# Patient Record
Sex: Female | Born: 1955
Health system: Southern US, Community
[De-identification: ages and names within clinical notes are randomized; demographics above are authoritative.]

## PROBLEM LIST (undated history)

## (undated) DIAGNOSIS — E785 Hyperlipidemia, unspecified: Secondary | ICD-10-CM

## (undated) DIAGNOSIS — E669 Obesity, unspecified: Secondary | ICD-10-CM

## (undated) DIAGNOSIS — K5792 Diverticulitis of intestine, part unspecified, without perforation or abscess without bleeding: Secondary | ICD-10-CM

## (undated) DIAGNOSIS — H8109 Meniere's disease, unspecified ear: Secondary | ICD-10-CM

## (undated) DIAGNOSIS — I10 Essential (primary) hypertension: Secondary | ICD-10-CM

## (undated) DIAGNOSIS — R42 Dizziness and giddiness: Secondary | ICD-10-CM

## (undated) DIAGNOSIS — R011 Cardiac murmur, unspecified: Secondary | ICD-10-CM

## (undated) DIAGNOSIS — H269 Unspecified cataract: Secondary | ICD-10-CM

## (undated) DIAGNOSIS — K56609 Unspecified intestinal obstruction, unspecified as to partial versus complete obstruction: Secondary | ICD-10-CM

## (undated) DIAGNOSIS — Z9889 Other specified postprocedural states: Secondary | ICD-10-CM

## (undated) DIAGNOSIS — N189 Chronic kidney disease, unspecified: Secondary | ICD-10-CM

## (undated) DIAGNOSIS — H9191 Unspecified hearing loss, right ear: Secondary | ICD-10-CM

## (undated) DIAGNOSIS — R112 Nausea with vomiting, unspecified: Secondary | ICD-10-CM

## (undated) HISTORY — DX: Cardiac murmur, unspecified: R01.1

## (undated) HISTORY — DX: Meniere's disease, unspecified ear: H81.09

## (undated) HISTORY — DX: Chronic kidney disease, unspecified: N18.9

## (undated) HISTORY — DX: Obesity, unspecified: E66.9

## (undated) HISTORY — DX: Hyperlipidemia, unspecified: E78.5

## (undated) HISTORY — PX: ABLATION: SHX5711

## (undated) HISTORY — DX: Essential (primary) hypertension: I10

## (undated) HISTORY — DX: Unspecified intestinal obstruction, unspecified as to partial versus complete obstruction: K56.609

## (undated) HISTORY — PX: TUBAL LIGATION: SHX77

## (undated) HISTORY — PX: SMALL INTESTINE SURGERY: SHX150

## (undated) HISTORY — PX: ABDOMINAL HYSTERECTOMY: SHX81

## (undated) HISTORY — DX: Nausea with vomiting, unspecified: R11.2

## (undated) HISTORY — PX: EYE SURGERY: SHX253

## (undated) HISTORY — DX: Diverticulitis of intestine, part unspecified, without perforation or abscess without bleeding: K57.92

## (undated) HISTORY — DX: Dizziness and giddiness: R42

## (undated) HISTORY — PX: APPENDECTOMY: SHX54

## (undated) HISTORY — PX: CATARACT EXTRACTION: SUR2

## (undated) HISTORY — DX: Unspecified cataract: H26.9

## (undated) HISTORY — PX: URETERAL REIMPLANTION: SHX2611

## (undated) HISTORY — PX: OTHER SURGICAL HISTORY: SHX169

## (undated) HISTORY — DX: Other specified postprocedural states: Z98.890

## (undated) HISTORY — PX: TONSILLECTOMY: SUR1361

---

## 2001-05-21 HISTORY — PX: OTHER SURGICAL HISTORY: SHX169

## 2011-05-22 HISTORY — PX: BREAST BIOPSY: SHX20

## 2016-05-21 HISTORY — PX: KIDNEY STONE SURGERY: SHX686

## 2018-12-03 ENCOUNTER — Telehealth: Payer: Self-pay

## 2018-12-03 NOTE — Telephone Encounter (Signed)

## 2018-12-04 ENCOUNTER — Encounter: Payer: Self-pay | Admitting: Family Medicine

## 2018-12-04 ENCOUNTER — Ambulatory Visit: Payer: Federal, State, Local not specified - PPO | Admitting: Family Medicine

## 2018-12-04 VITALS — BP 124/70 | HR 72 | Ht 66.0 in | Wt 202.5 lb

## 2018-12-04 DIAGNOSIS — R0609 Other forms of dyspnea: Secondary | ICD-10-CM | POA: Diagnosis not present

## 2018-12-04 DIAGNOSIS — Z0001 Encounter for general adult medical examination with abnormal findings: Secondary | ICD-10-CM | POA: Diagnosis not present

## 2018-12-04 DIAGNOSIS — Z0000 Encounter for general adult medical examination without abnormal findings: Secondary | ICD-10-CM | POA: Insufficient documentation

## 2018-12-04 DIAGNOSIS — E559 Vitamin D deficiency, unspecified: Secondary | ICD-10-CM | POA: Diagnosis not present

## 2018-12-04 DIAGNOSIS — R011 Cardiac murmur, unspecified: Secondary | ICD-10-CM

## 2018-12-04 LAB — LIPID PANEL
Cholesterol: 256 mg/dL — ABNORMAL HIGH (ref 0–200)
HDL: 56.8 mg/dL (ref >39.00)
LDL Cholesterol: 170 mg/dL — ABNORMAL HIGH (ref 0–99)
NonHDL: 198.76
Total CHOL/HDL Ratio: 4
Triglycerides: 144 mg/dL (ref 0.0–149.0)
VLDL: 28.8 mg/dL (ref 0.0–40.0)

## 2018-12-04 LAB — COMPREHENSIVE METABOLIC PANEL
ALT: 23 U/L (ref 0–35)
AST: 22 U/L (ref 0–37)
Albumin: 4.4 g/dL (ref 3.5–5.2)
Alkaline Phosphatase: 88 U/L (ref 39–117)
BUN: 15 mg/dL (ref 6–23)
CO2: 27 mEq/L (ref 19–32)
Calcium: 9.5 mg/dL (ref 8.4–10.5)
Chloride: 105 mEq/L (ref 96–112)
Creatinine, Ser: 0.63 mg/dL (ref 0.40–1.20)
GFR: 96 mL/min (ref >60.00)
Glucose, Bld: 89 mg/dL (ref 70–99)
Potassium: 4 mEq/L (ref 3.5–5.1)
Sodium: 139 mEq/L (ref 135–145)
Total Bilirubin: 0.5 mg/dL (ref 0.2–1.2)
Total Protein: 7.2 g/dL (ref 6.0–8.3)

## 2018-12-04 LAB — URINALYSIS, ROUTINE W REFLEX MICROSCOPIC
Bilirubin Urine: NEGATIVE
Hgb urine dipstick: NEGATIVE
Ketones, ur: NEGATIVE
Nitrite: NEGATIVE
Specific Gravity, Urine: 1.015 (ref 1.000–1.030)
Total Protein, Urine: NEGATIVE
Urine Glucose: NEGATIVE
Urobilinogen, UA: 0.2 (ref 0.0–1.0)
pH: 6.5 (ref 5.0–8.0)

## 2018-12-04 LAB — CBC
HCT: 41.5 % (ref 36.0–46.0)
Hemoglobin: 14 g/dL (ref 12.0–15.0)
MCHC: 33.7 g/dL (ref 30.0–36.0)
MCV: 88.9 fl (ref 78.0–100.0)
Platelets: 263 10*3/uL (ref 150.0–400.0)
RBC: 4.66 Mil/uL (ref 3.87–5.11)
RDW: 13.9 % (ref 11.5–15.5)
WBC: 5 10*3/uL (ref 4.0–10.5)

## 2018-12-04 LAB — TSH: TSH: 2.31 u[IU]/mL (ref 0.35–4.50)

## 2018-12-04 LAB — VITAMIN D 25 HYDROXY (VIT D DEFICIENCY, FRACTURES): VITD: 28.67 ng/mL — ABNORMAL LOW (ref 30.00–100.00)

## 2018-12-04 NOTE — Progress Notes (Addendum)
Established Patient Office Visit  Subjective:  Patient ID: Heather Villegas, female    DOB: 1956-03-03  Age: 63 y.o. MRN: [id number]  CC:  Chief Complaint  Patient presents with  . Annual Exam    HPI Heather Villegas presents for a physical exam and follow-up of a heart murmur and some shortness of breath that she is been experiencing with exercising.  She thinks she may have had a heart murmur as a child but is not sure.  She does exercise regularly with her dog but does experience shortness of breath by the time she reaches the top of the hill that is part of her walking route.  Patient does not smoke and rarely drinks alcohol she does not use illicit drugs.  Recently moved up here from [facility] with her husband and they have no children.  Sees the GYN doctor for her Pap and pelvic exams.  She is looking for a dentist at this time.  History reviewed. No pertinent past medical history.  Past Surgical History:  Procedure Laterality Date  . ABLATION    . bowel reconstruction    . BREAST BIOPSY    . CATARACT EXTRACTION    . KIDNEY STONE SURGERY    . TONSILLECTOMY    . TUBAL LIGATION    . ureter reconnection    . URETERAL REIMPLANTION      History reviewed. No pertinent family history.  Social History   Socioeconomic History  . Marital status: Married    Spouse name: Not on file  . Number of children: Not on file  . Years of education: Not on file  . Highest education level: Not on file  Occupational History  . Not on file  Social Needs  . Financial resource strain: Not on file  . Food insecurity    Worry: Not on file    Inability: Not on file  . Transportation needs    Medical: Not on file    Non-medical: Not on file  Tobacco Use  . Smoking status: Former Research scientist (life sciences)  . Smokeless tobacco: Never Used  Substance and Sexual Activity  . Alcohol use: Not on file    Comment: Rarely  . Drug use: Never  . Sexual activity: Not on file  Lifestyle  . Physical activity    Days per  week: Not on file    Minutes per session: Not on file  . Stress: Not on file  Relationships  . Social Herbalist on phone: Not on file    Gets together: Not on file    Attends religious service: Not on file    Active member of club or organization: Not on file    Attends meetings of clubs or organizations: Not on file    Relationship status: Not on file  . Intimate partner violence    Fear of current or ex partner: Not on file    Emotionally abused: Not on file    Physically abused: Not on file    Forced sexual activity: Not on file  Other Topics Concern  . Not on file  Social History Narrative  . Not on file    Outpatient Medications Prior to Visit  Medication Sig Dispense Refill  . Ascorbic Acid (VITAMIN C PO) Take 1 capsule by mouth daily.    . Coenzyme Q10 (CO Q 10 PO) Take 1 capsule by mouth daily.    [name] VITAMIN D PO Take 1 capsule by mouth daily.  No facility-administered medications prior to visit.     Allergies  Allergen Reactions  . Tape     surgical    ROS Review of Systems  Constitutional: Negative for chills, diaphoresis, fatigue, fever and unexpected weight change.  HENT: Negative.   Eyes: Negative for photophobia and visual disturbance.  Respiratory: Positive for shortness of breath. Negative for chest tightness and wheezing.   Cardiovascular: Negative.  Negative for chest pain and palpitations.  Gastrointestinal: Negative.   Endocrine: Negative for polyphagia and polyuria.  Genitourinary: Negative.   Musculoskeletal: Negative for gait problem and joint swelling.  Skin: Negative for color change and pallor.  Allergic/Immunologic: Negative for immunocompromised state.  Neurological: Negative for seizures, speech difficulty and numbness.  Hematological: Negative.   Psychiatric/Behavioral: Negative.       Objective:    Physical Exam  Constitutional: She is oriented to person, place, and time. She appears well-developed and  well-nourished. No distress.  HENT:  Head: Normocephalic and atraumatic.  Right Ear: External ear normal.  Left Ear: External ear normal.  Mouth/Throat: Oropharynx is clear and moist. No oropharyngeal exudate.  Eyes: Pupils are equal, round, and reactive to light. Conjunctivae are normal. Right eye exhibits no discharge. Left eye exhibits no discharge. No scleral icterus.  Neck: Neck supple. No JVD present. No tracheal deviation present. No thyromegaly present.  Cardiovascular: Normal rate and regular rhythm.  Murmur heard.  Systolic murmur is present with a grade of 1/6. LUSB   Pulmonary/Chest: Effort normal and breath sounds normal. No stridor.  Abdominal: Bowel sounds are normal.  Musculoskeletal:        General: No edema.  Lymphadenopathy:    She has no cervical adenopathy.  Neurological: She is alert and oriented to person, place, and time.  Skin: Skin is warm and dry. She is not diaphoretic.  Psychiatric: She has a normal mood and affect. Her behavior is normal.    BP 124/70   Pulse 72   Ht 5\' 6"  (1.676 m)   Wt 202 lb 8 oz (91.9 kg)   SpO2 98%   BMI 32.68 kg/m  Wt Readings from Last 3 Encounters:  12/04/18 202 lb 8 oz (91.9 kg)   BP Readings from Last 3 Encounters:  12/04/18 124/70   Guideline developer:  UpToDate (see UpToDate for funding source) Date Released: June 2014  Health Maintenance Due  Topic Date Due  . Hepatitis C Screening  [birth year], 1955  . HIV Screening  09/19/1972  . TETANUS/TDAP  09/19/1976  . PAP SMEAR-Modifier  09/20/1978  . MAMMOGRAM  09/20/2007  . COLONOSCOPY  09/20/2007  . INFLUENZA VACCINE  12/20/2018    There are no preventive care reminders to display for this patient.  Lab Results  Component Value Date   TSH 2.31 12/04/2018   Lab Results  Component Value Date   WBC 5.0 12/04/2018   HGB 14.0 12/04/2018   HCT 41.5 12/04/2018   MCV 88.9 12/04/2018   PLT 263.0 12/04/2018   Lab Results  Component Value Date   NA 139 12/04/2018    K 4.0 12/04/2018   CO2 27 12/04/2018   GLUCOSE 89 12/04/2018   BUN 15 12/04/2018   CREATININE 0.63 12/04/2018   BILITOT 0.5 12/04/2018   ALKPHOS 88 12/04/2018   AST 22 12/04/2018   ALT 23 12/04/2018   PROT 7.2 12/04/2018   ALBUMIN 4.4 12/04/2018   CALCIUM 9.5 12/04/2018   GFR 96.00 12/04/2018   Lab Results  Component Value Date   CHOL 256 (H)  12/04/2018   Lab Results  Component Value Date   HDL 56.80 12/04/2018   Lab Results  Component Value Date   LDLCALC 170 (H) 12/04/2018   Lab Results  Component Value Date   TRIG 144.0 12/04/2018   Lab Results  Component Value Date   CHOLHDL 4 12/04/2018   No results found for: HGBA1C    The 10-year ASCVD risk score Denman George(Goff DC Jr., et al., 2013) is: 4%   Values used to calculate the score:     Age: [age] years     Sex: Female     Is Non-Hispanic African American: No     Diabetic: No     Tobacco smoker: No     Systolic Blood Pressure: 124 mmHg     Is BP treated: No     HDL Cholesterol: 56.8 mg/dL     Total Cholesterol: 256 mg/dL Assessment & Plan:   Problem List Items Addressed This Visit      Other   DOE (dyspnea on exertion)   Relevant Orders   Ambulatory referral to Cardiology   Heart murmur   Relevant Orders   Ambulatory referral to Cardiology   Healthcare maintenance - Primary   Relevant Orders   CBC (Completed)   Comprehensive metabolic panel (Completed)   Lipid panel (Completed)   TSH (Completed)   Urinalysis, Routine w reflex microscopic (Completed)   VITAMIN D 25 Hydroxy (Vit-D Deficiency, Fractures) (Completed)   Ambulatory referral to Gastroenterology   DG Bone Density (Completed)   Ambulatory referral to Gynecology   Vitamin D deficiency   Relevant Medications   Vitamin D, Ergocalciferol, (DRISDOL) 1.25 MG (50000 UT) CAPS capsule      Meds ordered this encounter  Medications  . Vitamin D, Ergocalciferol, (DRISDOL) 1.25 MG (50000 UT) CAPS capsule    Sig: Take 1 capsule (50,000 Units total) by  mouth every 7 (seven) days.    Dispense:  5 capsule    Refill:  4    Follow-up: Return in about 6 months (around 06/06/2019).    Patient was given information on heart murmurs, health maintenance and disease prevention.  Suggested follow-up will pending results of blood work.

## 2018-12-04 NOTE — Patient Instructions (Signed)
Health Maintenance for Postmenopausal Women Menopause is a normal process in which your ability to get pregnant comes to an end. This process happens slowly over many months or years, usually between the ages of 48 and 55. Menopause is complete when you have missed your menstrual periods for 12 months. It is important to talk with your health care provider about some of the most common conditions that affect women after menopause (postmenopausal women). These include heart disease, cancer, and bone loss (osteoporosis). Adopting a healthy lifestyle and getting preventive care can help to promote your health and wellness. The actions you take can also lower your chances of developing some of these common conditions. What should I know about menopause? During menopause, you may get a number of symptoms, such as:  Hot flashes. These can be moderate or severe.  Night sweats.  Decrease in sex drive.  Mood swings.  Headaches.  Tiredness.  Irritability.  Memory problems.  Insomnia. Choosing to treat or not to treat these symptoms is a decision that you make with your health care provider. Do I need hormone replacement therapy?  Hormone replacement therapy is effective in treating symptoms that are caused by menopause, such as hot flashes and night sweats.  Hormone replacement carries certain risks, especially as you become older. If you are thinking about using estrogen or estrogen with progestin, discuss the benefits and risks with your health care provider. What is my risk for heart disease and stroke? The risk of heart disease, heart attack, and stroke increases as you age. One of the causes may be a change in the body's hormones during menopause. This can affect how your body uses dietary fats, triglycerides, and cholesterol. Heart attack and stroke are medical emergencies. There are many things that you can do to help prevent heart disease and stroke. Watch your blood pressure  High  blood pressure causes heart disease and increases the risk of stroke. This is more likely to develop in people who have high blood pressure readings, are of African descent, or are overweight.  Have your blood pressure checked: ? Every 3-5 years if you are 18-39 years of age. ? Every year if you are 40 years old or older. Eat a healthy diet   Eat a diet that includes plenty of vegetables, fruits, low-fat dairy products, and lean protein.  Do not eat a lot of foods that are high in solid fats, added sugars, or sodium. Get regular exercise Get regular exercise. This is one of the most important things you can do for your health. Most adults should:  Try to exercise for at least 150 minutes each week. The exercise should increase your heart rate and make you sweat (moderate-intensity exercise).  Try to do strengthening exercises at least twice each week. Do these in addition to the moderate-intensity exercise.  Spend less time sitting. Even light physical activity can be beneficial. Other tips  Work with your health care provider to achieve or maintain a healthy weight.  Do not use any products that contain nicotine or tobacco, such as cigarettes, e-cigarettes, and chewing tobacco. If you need help quitting, ask your health care provider.  Know your numbers. Ask your health care provider to check your cholesterol and your blood sugar (glucose). Continue to have your blood tested as directed by your health care provider. Do I need screening for cancer? Depending on your health history and family history, you may need to have cancer screening at different stages of your life. This   may include screening for:  Breast cancer.  Cervical cancer.  Lung cancer.  Colorectal cancer. What is my risk for osteoporosis? After menopause, you may be at increased risk for osteoporosis. Osteoporosis is a condition in which bone destruction happens more quickly than new bone creation. To help prevent  osteoporosis or the bone fractures that can happen because of osteoporosis, you may take the following actions:  If you are 7319-61 years old, get at least 1,000 mg of calcium and at least 600 mg of vitamin D per day.  If you are older than age 61 but younger than age 61, get at least 1,200 mg of calcium and at least 600 mg of vitamin D per day.  If you are older than age 61, get at least 1,200 mg of calcium and at least 800 mg of vitamin D per day. Smoking and drinking excessive alcohol increase the risk of osteoporosis. Eat foods that are rich in calcium and vitamin D, and do weight-bearing exercises several times each week as directed by your health care provider. How does menopause affect my mental health? Depression may occur at any age, but it is more common as you become older. Common symptoms of depression include:  Low or sad mood.  Changes in sleep patterns.  Changes in appetite or eating patterns.  Feeling an overall lack of motivation or enjoyment of activities that you previously enjoyed.  Frequent crying spells. Talk with your health care provider if you think that you are experiencing depression. General instructions See your health care provider for regular wellness exams and vaccines. This may include:  Scheduling regular health, dental, and eye exams.  Getting and maintaining your vaccines. These include: ? Influenza vaccine. Get this vaccine each year before the flu season begins. ? Pneumonia vaccine. ? Shingles vaccine. ? Tetanus, diphtheria, and pertussis (Tdap) booster vaccine. Your health care provider may also recommend other immunizations. Tell your health care provider if you have ever been abused or do not feel safe at home. Summary  Menopause is a normal process in which your ability to get pregnant comes to an end.  This condition causes hot flashes, night sweats, decreased interest in sex, mood swings, headaches, or lack of sleep.  Treatment for this  condition may include hormone replacement therapy.  Take actions to keep yourself healthy, including exercising regularly, eating a healthy diet, watching your weight, and checking your blood pressure and blood sugar levels.  Get screened for cancer and depression. Make sure that you are up to date with all your vaccines. This information is not intended to replace advice given to you by your health care provider. Make sure you discuss any questions you have with your health care provider. Document Released: 06/29/2005 Document Revised: 04/30/2018 Document Reviewed: 04/30/2018 Elsevier Patient Education  2020 [employer]  Heart Murmur A heart murmur is an extra sound that is caused by chaotic blood flow through the valves of the heart. The murmur can be heard as a "hum" or "whoosh" sound when blood flows through the heart. There are two types of heart murmurs:  Innocent (benign) murmurs. Most people with this type of heart murmur do not have a heart problem. Many children have innocent heart murmurs. Your health care provider may suggest some basic tests to find out whether your murmur is an innocent murmur. If an innocent heart murmur is found, there is no need for further tests or treatment and no need to restrict activities or stop playing sports.  Abnormal murmurs. These types of murmurs can occur in children and adults. Abnormal murmurs may be a sign of a more serious heart condition, such as a heart defect present at birth (congenital defect) or heart valve disease. What are the causes?  The heart has four areas called chambers. Valves separate the upper and lower chambers from each other (tricuspid valve and mitral valve) and separate the lower chambers of the heart from pathways that lead away from the heart (aortic valve and pulmonary valve). Normally, the valves open to let blood flow through or out of your heart, and then they shut to keep the blood from flowing backward. This  condition is caused by heart valves that are not working properly.  In children, abnormal heart murmurs are typically caused by congenital defects.  In adults, abnormal murmurs are usually caused by heart valve problems from disease, infection, or aging. This condition may also be caused by:  Pregnancy.  Fever.  Overactive thyroid gland.  Anemia.  Exercise.  Rapid growth spurts (in children). What are the signs or symptoms? Innocent murmurs do not cause symptoms, and many people with abnormal murmurs may not have symptoms. If symptoms do develop, they may include:  Shortness of breath.  Blue coloring of the skin, especially on the fingertips.  Chest pain.  Palpitations, or feeling a fluttering or skipped heartbeat.  Fainting.  Persistent cough.  Getting tired much faster than expected.  Swelling in the abdomen, feet, or ankles. How is this diagnosed? This condition may be diagnosed during a routine physical or other exam. If your health care provider hears a murmur with a stethoscope, he or she will listen for:  Where the murmur is located in your heart.  How long the murmur lasts (duration).  When the murmur is heard during the heartbeat.  How loud the murmur is. This may help the health care provider figure out what is causing the murmur. You may be referred to a heart specialist (cardiologist). You may also have other tests, including:  Electrocardiogram (ECG or EKG). This test measures the electrical activity of your heart.  Echocardiogram. This test uses high frequency sound waves to make pictures of your heart.  MRI or chest X-ray.  Cardiac catheterization. This test looks at blood flow through the arteries around the heart. For children and adults who have an abnormal heart murmur and want to stay active, it is important to:  Complete testing.  Review test results.  Receive recommendations from your health care provider. If heart disease is  present, it may not be safe to play or be active. How is this treated? Heart murmurs themselves do not need treatment. In some cases, a heart murmur may go away on its own. If an underlying problem or disease is causing the murmur, you may need treatment. If treatment is needed, it will depend on the type and severity of the disease or heart problem causing the murmur. Treatment may include:  Medicine.  Surgery.  Dietary and lifestyle changes. Follow these instructions at home:  Talk with your health care provider before participating in sports or other activities that require a lot of effort and energy (are strenuous).  Learn as much as possible about your condition and any related diseases. Ask your health care provider if you may be at risk for any medical emergencies.  Talk with your health care provider about what symptoms you should look out for.  It is up to you to get your test results. Ask  your health care provider, or the department that is doing the test, when your results will be ready.  Keep all follow-up visits as told by your health care provider. This is important. Contact a health care provider if:  You are frequently short of breath.  You feel more tired than usual.  You are having a hard time keeping up with normal activities or fitness routines.  You have swelling in your ankles or feet.  You notice that your heart often beats irregularly.  You develop any new symptoms. Get help right away if:  You have chest pain.  You are having trouble breathing.  You feel light-headed or you pass out.  Your symptoms suddenly get worse. These symptoms may represent a serious problem that is an emergency. Do not wait to see if the symptoms will go away. Get medical help right away. Call your local emergency services (911 in the [place] Do not drive yourself to the hospital. Summary  Normally, the heart valves open to let blood flow through or out of your heart, and  then they shut to keep the blood from flowing backward.  A heart murmur is caused by heart valves that are not working properly.  You may need treatment if an underlying problem or disease is causing the heart murmur. Treatment may include medicine, surgery, or dietary and lifestyle changes.  Talk with your health care provider before participating in sports or other activities that require a lot of effort and energy (are strenuous).  Talk with your health care provider about what symptoms you should watch out for. This information is not intended to replace advice given to you by your health care provider. Make sure you discuss any questions you have with your health care provider. Document Released: 06/14/2004 Document Revised: 10/29/2017 Document Reviewed: 10/29/2017 Elsevier Patient Education  2020 [employer].

## 2018-12-05 DIAGNOSIS — E559 Vitamin D deficiency, unspecified: Secondary | ICD-10-CM | POA: Insufficient documentation

## 2018-12-05 MED ORDER — VITAMIN D (ERGOCALCIFEROL) 1.25 MG (50000 UNIT) PO CAPS: 50000.0000 [IU] | ORAL_CAPSULE | ORAL | 4 refills | Status: DC

## 2018-12-05 NOTE — Addendum Note (Signed)
Addended by: Abelino Derrick A on: 12/05/2018 10:50 AM   Modules accepted: Orders

## 2018-12-11 ENCOUNTER — Other Ambulatory Visit (HOSPITAL_BASED_OUTPATIENT_CLINIC_OR_DEPARTMENT_OTHER): Payer: Federal, State, Local not specified - PPO

## 2018-12-11 ENCOUNTER — Ambulatory Visit (HOSPITAL_BASED_OUTPATIENT_CLINIC_OR_DEPARTMENT_OTHER)
Admission: RE | Admit: 2018-12-11 | Discharge: 2018-12-11 | Disposition: A | Discharge status: Home / Self Care | Payer: Federal, State, Local not specified - PPO | Source: Ambulatory Visit | Attending: Family Medicine | Admitting: Family Medicine

## 2018-12-11 ENCOUNTER — Other Ambulatory Visit: Payer: Self-pay

## 2018-12-11 DIAGNOSIS — Z0000 Encounter for general adult medical examination without abnormal findings: Secondary | ICD-10-CM | POA: Insufficient documentation

## 2018-12-11 DIAGNOSIS — Z13820 Encounter for screening for osteoporosis: Secondary | ICD-10-CM | POA: Diagnosis not present

## 2018-12-11 DIAGNOSIS — Z780 Asymptomatic menopausal state: Secondary | ICD-10-CM | POA: Diagnosis not present

## 2018-12-22 ENCOUNTER — Encounter: Payer: Self-pay | Admitting: Family Medicine

## 2018-12-22 DIAGNOSIS — H9190 Unspecified hearing loss, unspecified ear: Secondary | ICD-10-CM

## 2018-12-23 ENCOUNTER — Ambulatory Visit: Payer: Federal, State, Local not specified - PPO | Admitting: Cardiology

## 2018-12-30 NOTE — Addendum Note (Signed)
Addended by: Jon Billings on: 12/30/2018 10:51 AM   Modules accepted: Orders

## 2019-01-01 ENCOUNTER — Encounter: Payer: Self-pay | Admitting: Gastroenterology

## 2019-01-02 ENCOUNTER — Other Ambulatory Visit: Payer: Self-pay | Admitting: Obstetrics & Gynecology

## 2019-01-02 ENCOUNTER — Other Ambulatory Visit: Payer: Self-pay | Admitting: Family Medicine

## 2019-01-02 DIAGNOSIS — Z1231 Encounter for screening mammogram for malignant neoplasm of breast: Secondary | ICD-10-CM

## 2019-01-05 ENCOUNTER — Encounter: Payer: Self-pay | Admitting: Cardiology

## 2019-01-05 ENCOUNTER — Ambulatory Visit (INDEPENDENT_AMBULATORY_CARE_PROVIDER_SITE_OTHER): Payer: Federal, State, Local not specified - PPO | Admitting: Cardiology

## 2019-01-05 ENCOUNTER — Other Ambulatory Visit: Payer: Self-pay

## 2019-01-05 VITALS — BP 154/64 | Resp 10 | Ht 66.0 in | Wt 201.8 lb

## 2019-01-05 DIAGNOSIS — R011 Cardiac murmur, unspecified: Secondary | ICD-10-CM

## 2019-01-05 DIAGNOSIS — E785 Hyperlipidemia, unspecified: Secondary | ICD-10-CM

## 2019-01-05 DIAGNOSIS — R0609 Other forms of dyspnea: Secondary | ICD-10-CM | POA: Diagnosis not present

## 2019-01-05 NOTE — Addendum Note (Signed)
Addended by: Polly Cobia A on: 01/05/2019 09:54 AM   Modules accepted: Orders

## 2019-01-05 NOTE — Progress Notes (Signed)
Cardiology Consultation:    Date:  01/05/2019   ID:  Heather AveMary Anding, DOB 03/25/1956, MRN [id number]  PCP:  Mliss SaxKremer, William Alfred, MD  Cardiologist:  Gypsy Balsamobert Krasowski, MD   Referring MD: Mliss SaxKremer, William Alfred,*   Chief Complaint  Patient presents with  . Heart Murmur    History of Present Illness:    Heather Villegas is a 63 y.o. female who is being seen today for the evaluation of heart murmur at the request of Mliss SaxKremer, William Alfred,*.  She went for regular physical examination and during the physical examination she was discovered to heart heart murmur she was referred to us for evaluation.  She was never told about heart murmur and overall she seems to be doing well.  However, within last 3 to 6 months she noted more shortness of breath while walking her dog.  Denies having any palpitations.  There is no swelling of lower extremities, there is no proximal nocturnal dyspnea.  Overall she does not have any additional cardiac complaints.  Overall she considers herself very healthy person.  Recently she was diagnosed with dyslipidemia and she initiated strict diet to help to improve her cholesterol. She does not have family history of premature coronary artery disease as well as heart problem. She smoked only 1 year in her life.  Does not smoke anymore.  No past medical history on file.  Past Surgical History:  Procedure Laterality Date  . ABLATION    . bowel reconstruction    . BREAST BIOPSY    . CATARACT EXTRACTION    . KIDNEY STONE SURGERY    . TONSILLECTOMY    . TUBAL LIGATION    . ureter reconnection    . URETERAL REIMPLANTION      Current Medications: Current Meds  Medication Sig  . Ascorbic Acid (VITAMIN C PO) Take 1 capsule by mouth daily.  . Coenzyme Q10 (CO Q 10 PO) Take 1 capsule by mouth daily.  . Multiple Vitamin (MULTIVITAMIN) capsule Take 1 capsule by mouth daily.  . Vitamin D, Ergocalciferol, (DRISDOL) 1.25 MG (50000 UT) CAPS capsule Take 1 capsule (50,000 Units  total) by mouth every 7 (seven) days.     Allergies:   Tape   Social History   Socioeconomic History  . Marital status: Married    Spouse name: Not on file  . Number of children: Not on file  . Years of education: Not on file  . Highest education level: Not on file  Occupational History  . Not on file  Social Needs  . Financial resource strain: Not on file  . Food insecurity    Worry: Not on file    Inability: Not on file  . Transportation needs    Medical: Not on file    Non-medical: Not on file  Tobacco Use  . Smoking status: Former Games developermoker  . Smokeless tobacco: Never Used  Substance and Sexual Activity  . Alcohol use: Not on file    Comment: Rarely  . Drug use: Never  . Sexual activity: Not on file  Lifestyle  . Physical activity    Days per week: Not on file    Minutes per session: Not on file  . Stress: Not on file  Relationships  . Social Musicianconnections    Talks on phone: Not on file    Gets together: Not on file    Attends religious service: Not on file    Active member of club or organization: Not on file  Attends meetings of clubs or organizations: Not on file    Relationship status: Not on file  Other Topics Concern  . Not on file  Social History Narrative  . Not on file     Family History: The patient's family history is not on file. ROS:   Please see the history of present illness.    All 14 point review of systems negative except as described per history of present illness.  EKGs/Labs/Other Studies Reviewed:    The following studies were reviewed today:   EKG:  EKG is  ordered today.  The ekg ordered today demonstrates normal sinus rhythm, possible left atrial enlargement, borderline EKG.  Recent Labs: 12/04/2018: ALT 23; BUN 15; Creatinine, Ser 0.63; Hemoglobin 14.0; Platelets 263.0; Potassium 4.0; Sodium 139; TSH 2.31  Recent Lipid Panel    Component Value Date/Time   CHOL 256 (H) 12/04/2018 0957   TRIG 144.0 12/04/2018 0957   HDL  56.80 12/04/2018 0957   CHOLHDL 4 12/04/2018 0957   VLDL 28.8 12/04/2018 0957   LDLCALC 170 (H) 12/04/2018 0957    Physical Exam:    VS:  BP (!) 154/64   Resp 10   Ht 5\' 6"  (1.676 m)   Wt 201 lb 12.8 oz (91.5 kg)   BMI 32.57 kg/m     Wt Readings from Last 3 Encounters:  01/05/19 201 lb 12.8 oz (91.5 kg)  12/04/18 202 lb 8 oz (91.9 kg)     GEN:  Well nourished, well developed in no acute distress HEENT: Normal NECK: No JVD; No carotid bruits LYMPHATICS: No lymphadenopathy CARDIAC: RRR, systolic murmur best heard left border of the sternum great 1-2 over 6 without radiation, no rubs, no gallops RESPIRATORY:  Clear to auscultation without rales, wheezing or rhonchi  ABDOMEN: Soft, non-tender, non-distended MUSCULOSKELETAL:  No edema; No deformity  SKIN: Warm and dry NEUROLOGIC:  Alert and oriented x 3 PSYCHIATRIC:  Normal affect   ASSESSMENT:    1. Heart murmur   2. DOE (dyspnea on exertion)   3. Dyslipidemia    PLAN:    In order of problems listed above:  1. Heart murmur.  It is a very soft murmur.  I suspect probably mitral regurgitation.  We will get echocardiogram to clarify the diagnosis.  Likely she does not have too many symptoms.  Shortness of breath however she describes obviously concerning.  Again echocardiogram will be done to clarify the lesion. 2. Dyspnea on exertion only mild and possibly related to some deconditioning.  Echocardiogram will be done and I will talk about in the future how to improve the situation. 3. Dyslipidemia she is on diet which will continue for now.  However with the level of cholesterol I see I doubt will be able to controlled with diet alone.  In the future she most likely will require some medications.   Medication Adjustments/Labs and Tests Ordered: Current medicines are reviewed at length with the patient today.  Concerns regarding medicines are outlined above.  No orders of the defined types were placed in this encounter.  No  orders of the defined types were placed in this encounter.   Signed, Park Liter, MD, [facility]. 01/05/2019 9:05 AM    [facility]

## 2019-01-05 NOTE — Patient Instructions (Signed)
Medication Instructions:  Your physician recommends that you continue on your current medications as directed. Please refer to the Current Medication list given to you today.  If you need a refill on your cardiac medications before your next appointment, please call your pharmacy.   Lab work: NONE   If you have labs (blood work) drawn today and your tests are completely normal, you will receive your results only by: [name] MyChart Message (if you have MyChart) OR . A paper copy in the mail If you have any lab test that is abnormal or we need to change your treatment, we will call you to review the results.  Testing/Procedures: Your physician has requested that you have an echocardiogram. Echocardiography is a painless test that uses sound waves to create images of your heart. It provides your doctor with information about the size and shape of your heart and how well your heart's chambers and valves are working. This procedure takes approximately one hour. There are no restrictions for this procedure.   Echocardiogram An echocardiogram is a procedure that uses painless sound waves (ultrasound) to produce an image of the heart. Images from an echocardiogram can provide important information about: Signs of coronary artery disease (CAD). Aneurysm detection. An aneurysm is a weak or damaged part of an artery wall that bulges out from the normal force of blood pumping through the body. Heart size and shape. Changes in the size or shape of the heart can be associated with certain conditions, including heart failure, aneurysm, and CAD. Heart muscle function. Heart valve function. Signs of a past heart attack. Fluid buildup around the heart. Thickening of the heart muscle. A tumor or infectious growth around the heart valves. Tell a health care provider about: Any allergies you have. All medicines you are taking, including vitamins, herbs, eye drops, creams, and over-the-counter medicines. Any  blood disorders you have. Any surgeries you have had. Any medical conditions you have. Whether you are pregnant or may be pregnant. What are the risks? Generally, this is a safe procedure. However, problems may occur, including: Allergic reaction to dye (contrast) that may be used during the procedure. What happens before the procedure? No specific preparation is needed. You may eat and drink normally. What happens during the procedure?  An IV tube may be inserted into one of your veins. You may receive contrast through this tube. A contrast is an injection that improves the quality of the pictures from your heart. A gel will be applied to your chest. A wand-like tool (transducer) will be moved over your chest. The gel will help to transmit the sound waves from the transducer. The sound waves will harmlessly bounce off of your heart to allow the heart images to be captured in real-time motion. The images will be recorded on a computer. The procedure may vary among health care providers and hospitals. What happens after the procedure? You may return to your normal, everyday life, including diet, activities, and medicines, unless your health care provider tells you not to do that. Summary An echocardiogram is a procedure that uses painless sound waves (ultrasound) to produce an image of the heart. Images from an echocardiogram can provide important information about the size and shape of your heart, heart muscle function, heart valve function, and fluid buildup around your heart. You do not need to do anything to prepare before this procedure. You may eat and drink normally. After the echocardiogram is completed, you may return to your normal, everyday life, unless  your health care provider tells you not to do that. This information is not intended to replace advice given to you by your health care provider. Make sure you discuss any questions you have with your health care provider. Document  Released: 05/04/2000 Document Revised: 08/28/2018 Document Reviewed: 06/09/2016 Elsevier Patient Education  2020 [employer]   Follow-Up: At [facility], you and your health needs are our priority.  As part of our continuing mission to provide you with exceptional heart care, we have created designated Provider Care Teams.  These Care Teams include your primary Cardiologist (physician) and Advanced Practice Providers (APPs -  Physician Assistants and Nurse Practitioners) who all work together to provide you with the care you need, when you need it. . You will need a follow up appointment in 6 months.   Any Other Special Instructions Will Be Listed Below (If Applicable).

## 2019-01-05 NOTE — Addendum Note (Signed)
Addended by: Aleatha Borer on: 01/05/2019 09:21 AM   Modules accepted: Orders

## 2019-01-08 ENCOUNTER — Other Ambulatory Visit: Payer: Self-pay

## 2019-01-08 ENCOUNTER — Ambulatory Visit (HOSPITAL_BASED_OUTPATIENT_CLINIC_OR_DEPARTMENT_OTHER)
Admission: RE | Admit: 2019-01-08 | Discharge: 2019-01-08 | Disposition: A | Discharge status: Home / Self Care | Payer: Federal, State, Local not specified - PPO | Source: Ambulatory Visit | Attending: Cardiology | Admitting: Cardiology

## 2019-01-08 DIAGNOSIS — R011 Cardiac murmur, unspecified: Secondary | ICD-10-CM | POA: Diagnosis not present

## 2019-01-08 DIAGNOSIS — R0609 Other forms of dyspnea: Secondary | ICD-10-CM | POA: Insufficient documentation

## 2019-01-08 NOTE — Progress Notes (Signed)
  Echocardiogram 2D Echocardiogram has been performed.  Heather Villegas 01/08/2019, 9:05 AM

## 2019-01-12 ENCOUNTER — Other Ambulatory Visit: Payer: Self-pay

## 2019-01-12 ENCOUNTER — Ambulatory Visit (AMBULATORY_SURGERY_CENTER): Payer: Self-pay

## 2019-01-12 VITALS — Temp 96.6°F | Ht 66.0 in | Wt 197.0 lb

## 2019-01-12 DIAGNOSIS — Z1211 Encounter for screening for malignant neoplasm of colon: Secondary | ICD-10-CM

## 2019-01-12 MED ORDER — SUPREP BOWEL PREP KIT 17.5-3.13-1.6 GM/177ML PO SOLN: 1.0000 | Freq: Once | ORAL | 0 refills | Status: AC

## 2019-01-12 NOTE — Progress Notes (Signed)
Per pt, no allergies to soy or egg products.Pt not taking any weight loss meds or using  O2 at home.  Pt denies sedation problems.   Pt refused emmi video.  

## 2019-01-15 DIAGNOSIS — H903 Sensorineural hearing loss, bilateral: Secondary | ICD-10-CM | POA: Diagnosis not present

## 2019-01-21 ENCOUNTER — Encounter: Payer: Self-pay | Admitting: Gastroenterology

## 2019-01-29 ENCOUNTER — Encounter (HOSPITAL_BASED_OUTPATIENT_CLINIC_OR_DEPARTMENT_OTHER): Payer: Self-pay

## 2019-01-29 ENCOUNTER — Emergency Department (HOSPITAL_BASED_OUTPATIENT_CLINIC_OR_DEPARTMENT_OTHER)
Admission: EM | Admit: 2019-01-29 | Discharge: 2019-01-30 | Disposition: A | Discharge status: Home / Self Care | Payer: Federal, State, Local not specified - PPO | Attending: Emergency Medicine | Admitting: Emergency Medicine

## 2019-01-29 DIAGNOSIS — R42 Dizziness and giddiness: Secondary | ICD-10-CM | POA: Diagnosis not present

## 2019-01-29 DIAGNOSIS — Z79899 Other long term (current) drug therapy: Secondary | ICD-10-CM | POA: Insufficient documentation

## 2019-01-29 HISTORY — DX: Unspecified hearing loss, right ear: H91.91

## 2019-01-29 MED ORDER — ONDANSETRON HCL 4 MG/2ML IJ SOLN
4.0000 mg | Freq: Once | INTRAMUSCULAR | Status: AC
  Administered 2019-01-29: 4 mg via INTRAVENOUS
  Filled 2019-01-29: qty 2

## 2019-01-29 MED ORDER — DIAZEPAM 5 MG/ML IJ SOLN
2.5000 mg | Freq: Once | INTRAMUSCULAR | Status: AC
  Administered 2019-01-29: 23:00:00 2.5 mg via INTRAVENOUS

## 2019-01-29 MED ORDER — DIAZEPAM 5 MG/ML IJ SOLN
INTRAMUSCULAR | Status: AC
  Filled 2019-01-29: qty 2

## 2019-01-29 NOTE — ED Triage Notes (Addendum)
C/o dizziness x 1 hour-denies pain but states she "feels pressure in my head"-denies as HA-pt was assisted from car to w/c to tx stretcher-husband with pt-kept eyes closed and states dizziness/nausea is worse when she moves

## 2019-01-29 NOTE — ED Provider Notes (Signed)
[facility] Provider Note: Lowella DellJ. Lane Duaine Radin, MD, FACEP  CSN: [id number] MRN: [id number] ARRIVAL: 01/29/19 at 2211 ROOM: MH08/MH08   CHIEF COMPLAINT  Dizziness   HISTORY OF PRESENT ILLNESS  01/29/19 10:59 PM Heather Villegas is a 63 y.o. female who had the sudden onset of vertigo as she was walking her dog.  The onset was about 9 PM.  She describes her symptoms as the room spinning associated with diaphoresis, nausea and vomiting.  Symptoms are worse with standing or with her eyes open.  Symptoms are improved with closing her eyes and lying still.  Symptoms have been severe at their worst.  She has no history of vertigo in the past.  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  She also feels like she has no energy.   Past Medical History:  Diagnosis Date  . Deafness in right ear   . Heart murmur   . Hyperlipidemia    diet  controlled  . Post-operative nausea and vomiting     Past Surgical History:  Procedure Laterality Date  . ABDOMINAL HYSTERECTOMY     2 attempts/ unable to remove uterus/ had a lot bleeding until ablation was done  . ABLATION    . bowel reconstruction  2003   had adhesions/ removed part of small intestine  . BREAST BIOPSY  2013   left/ precancerous lesion  . CATARACT EXTRACTION     left eye  . KIDNEY STONE SURGERY  2018   lithotripsy  . TONSILLECTOMY    . TUBAL LIGATION    . ureter reconnection    . URETERAL REIMPLANTION      Family History  Problem Relation Age of Onset  . Lung cancer Mother   . Lung cancer Father   . Thrombosis Father   . Drug abuse Brother     Social History   Tobacco Use  . Smoking status: Former Games developermoker  . Smokeless tobacco: Never Used  Substance Use Topics  . Alcohol use: Yes    Comment: Rare  . Drug use: Never    Prior to Admission medications   Medication Sig Start Date End Date Taking? Authorizing Provider  Ascorbic Acid (VITAMIN C PO) Take 500 mg by mouth daily. Take 2 capsules daily     [provider]  b complex vitamins tablet Take 1 tablet by mouth daily.    [provider]  Coenzyme Q10 (CO Q 10 PO) Take 1 capsule by mouth daily.    [provider]  meclizine (ANTIVERT) 25 MG tablet Take 1 tablet (25 mg total) by mouth 3 (three) times daily as needed for dizziness. 01/30/19   Zadyn Yardley, MD  Multiple Vitamin (MULTIVITAMIN) capsule Take 1 capsule by mouth daily.    [provider]  TURMERIC PO Take 400 mg by mouth daily.    [provider]  Vitamin D, Ergocalciferol, (DRISDOL) 1.25 MG (50000 UT) CAPS capsule Take 1 capsule (50,000 Units total) by mouth every 7 (seven) days. 12/05/18   Mliss SaxKremer, William Alfred, MD    Allergies Tape   REVIEW OF SYSTEMS  Negative except as noted here or in the History of Present Illness.   PHYSICAL EXAMINATION  Initial Vital Signs Blood pressure (!) 150/56, pulse 65, temperature 97.7 F (36.5 C), temperature source Oral, resp. rate 16, height 5\' 6"  (1.676 m), weight 88.5 kg, SpO2 98 %.  Examination General: Well-developed, well-nourished female in no acute distress; appearance consistent with age of record  HENT: normocephalic; atraumatic Eyes: pupils equal, round and reactive to light; extraocular muscles intact; resting nystagmus Neck: supple Heart: regular rate and rhythm Lungs: clear to auscultation bilaterally Abdomen: soft; nondistended; nontender; bowel sounds present Extremities: No deformity; full range of motion; pulses normal Neurologic: Awake, alert; noted to move all extremities but exam limited due to patient's unwillingness to move Skin: Warm and dry Psychiatric: Flat affect   RESULTS  Summary of this visit's results, reviewed by myself:   EKG Interpretation  Date/Time:    Ventricular Rate:    PR Interval:    QRS Duration:   QT Interval:    QTC Calculation:   R Axis:     Text Interpretation:        Laboratory Studies: No results found for this or any  previous visit (from the past 24 hour(s)). Imaging Studies: No results found.  ED COURSE and MDM  Nursing notes and initial vitals signs, including pulse oximetry, reviewed.  Vitals:   01/29/19 2220  BP: (!) 150/56  Pulse: 65  Resp: 16  Temp: 97.7 F (36.5 C)  TempSrc: Oral  SpO2: 98%  Weight: 88.5 kg  Height: 5\' 6"  (1.676 m)   12:28 AM Vertigo and nystagmus improved but not completely abated by IV Valium.  Will add IV Benadryl.  1:55 AM Patient significantly improved after IV Benadryl.  She her nystagmus has resolved.  She is able to sit up without difficulty.  We will send her home on meclizine.  PROCEDURES    ED DIAGNOSES     ICD-10-CM   1. Vertigo  R42        Ronnie Doo, MD 01/30/19 0157

## 2019-01-30 ENCOUNTER — Telehealth: Payer: Self-pay

## 2019-01-30 ENCOUNTER — Telehealth: Payer: Self-pay | Admitting: Gastroenterology

## 2019-01-30 MED ORDER — DIPHENHYDRAMINE HCL 50 MG/ML IJ SOLN
25.0000 mg | Freq: Once | INTRAMUSCULAR | Status: AC
  Administered 2019-01-30: 01:00:00 25 mg via INTRAVENOUS
  Filled 2019-01-30: qty 1

## 2019-01-30 MED ORDER — MECLIZINE HCL 25 MG PO TABS: 25.0000 mg | ORAL_TABLET | Freq: Three times a day (TID) | ORAL | 0 refills | Status: DC | PRN

## 2019-01-30 MED ORDER — LIDOCAINE-EPINEPHRINE (PF) 2 %-1:200000 IJ SOLN
INTRAMUSCULAR | Status: AC
  Filled 2019-01-30: qty 10

## 2019-01-30 NOTE — ED Notes (Signed)
Pt ambulated to restroom with assist from husband and nurse, gait unsteady

## 2019-01-30 NOTE — Telephone Encounter (Signed)
I have reviewed the chart and recommend:  1) OK to proceed with the colonoscopy if she feels up to it 2) Suggest that if she feels ok to do prep Sunday 9/13 she do so  3) If not feeling up to it can not prep and we can cancel the colonoscopy and reschedule and would not levy a late cancellation fee

## 2019-01-30 NOTE — Telephone Encounter (Signed)
Pt's husband calling again regarding previous message.

## 2019-01-30 NOTE — Telephone Encounter (Signed)
Thank you Dr. Carlean Purl!  Called patient back and she does want to proceed with colonoscopy on Monday.

## 2019-01-30 NOTE — Telephone Encounter (Signed)
[name],  This p'st vertigo seems to related to her inner ear and the ED MD only requires her to f/u with her PMD.  Except for a murmur she has no cardiac history and her most recent ECG was NSR.  She should be fine to have her procedure as scheduled.  It would be prudent to also get clearance from the [facility] MD.  Thanks,  Osvaldo Angst

## 2019-02-02 ENCOUNTER — Other Ambulatory Visit: Payer: Self-pay

## 2019-02-02 ENCOUNTER — Ambulatory Visit (AMBULATORY_SURGERY_CENTER): Payer: Federal, State, Local not specified - PPO | Admitting: Gastroenterology

## 2019-02-02 ENCOUNTER — Encounter: Payer: Self-pay | Admitting: Gastroenterology

## 2019-02-02 VITALS — BP 124/51 | HR 58 | Temp 98.0°F | Resp 16 | Ht 64.0 in | Wt 197.0 lb

## 2019-02-02 DIAGNOSIS — K5730 Diverticulosis of large intestine without perforation or abscess without bleeding: Secondary | ICD-10-CM

## 2019-02-02 DIAGNOSIS — Z1211 Encounter for screening for malignant neoplasm of colon: Secondary | ICD-10-CM

## 2019-02-02 MED ORDER — SODIUM CHLORIDE 0.9 % IV SOLN: 500.0000 mL | INTRAVENOUS | Status: DC

## 2019-02-02 NOTE — Patient Instructions (Signed)
YOU HAD AN ENDOSCOPIC PROCEDURE TODAY AT THE [facility]:   Refer to the procedure report that was given to you for any specific questions about what was found during the examination.  If the procedure report does not answer your questions, please call your gastroenterologist to clarify.  If you requested that your care partner not be given the details of your procedure findings, then the procedure report has been included in a sealed envelope for you to review at your convenience later.  YOU SHOULD EXPECT: Some feelings of bloating in the abdomen. Passage of more gas than usual.  Walking can help get rid of the air that was put into your GI tract during the procedure and reduce the bloating. If you had a lower endoscopy (such as a colonoscopy or flexible sigmoidoscopy) you may notice spotting of blood in your stool or on the toilet paper. If you underwent a bowel prep for your procedure, you may not have a normal bowel movement for a few days.  Please Note:  You might notice some irritation and congestion in your nose or some drainage.  This is from the oxygen used during your procedure.  There is no need for concern and it should clear up in a day or so.  SYMPTOMS TO REPORT IMMEDIATELY:   Following lower endoscopy (colonoscopy or flexible sigmoidoscopy):  Excessive amounts of blood in the stool  Significant tenderness or worsening of abdominal pains  Swelling of the abdomen that is new, acute  Fever of 100F or higher    For urgent or emergent issues, a gastroenterologist can be reached at any hour by calling [phone].   DIET:  We do recommend a small meal at first, but then you may proceed to your regular diet.  Drink plenty of fluids but you should avoid alcoholic beverages for 24 hours.  ACTIVITY:  You should plan to take it easy for the rest of today and you should NOT DRIVE or use heavy machinery until tomorrow (because of the sedation medicines used during the test).     FOLLOW UP: Our staff will call the number listed on your records 48-72 hours following your procedure to check on you and address any questions or concerns that you may have regarding the information given to you following your procedure. If we do not reach you, we will leave a message.  We will attempt to reach you two times.  During this call, we will ask if you have developed any symptoms of COVID 19. If you develop any symptoms (ie: fever, flu-like symptoms, shortness of breath, cough etc.) before then, please call [phone].  If you test positive for Covid 19 in the 2 weeks post procedure, please call and report this information to us.    If any biopsies were taken you will be contacted by phone or by letter within the next 1-3 weeks.  Please call us at [phone] if you have not heard about the biopsies in 3 weeks.    SIGNATURES/CONFIDENTIALITY: You and/or your care partner have signed paperwork which will be entered into your electronic medical record.  These signatures attest to the fact that that the information above on your After Visit Summary has been reviewed and is understood.  Full responsibility of the confidentiality of this discharge information lies with you and/or your care-partner.    Handouts were given to you  diverticulosis and hemorrhoids. You may resume your current medications today. Repeat colonoscopy in 10 years for screening  purposes. Please call if any questions or concerns.

## 2019-02-02 NOTE — Progress Notes (Signed)
Report to PACU, RN, vss, BBS= Clear.  

## 2019-02-02 NOTE — Progress Notes (Signed)
No problems noted in the recovery room. maw 

## 2019-02-02 NOTE — Progress Notes (Signed)
Pt's states no medical or surgical changes since previsit or office visit.  No egg or soy allergy  

## 2019-02-02 NOTE — Op Note (Signed)
[facility] Patient Name: Heather Villegas Procedure Date: 02/02/2019 9:17 AM MRN: [id number] Endoscopist: Gerrit Heck , MD Age: 63 Referring MD:  Date of Birth: 08-12-1955 Gender: Female Account #: [id number] Procedure:                Colonoscopy Indications:              Screening for colorectal malignant neoplasm (last                            colonoscopy was 10 years ago at outside facility) Medicines:                Monitored Anesthesia Care Procedure:                Pre-Anesthesia Assessment:                           - Prior to the procedure, a History and Physical                            was performed, and patient medications and                            allergies were reviewed. The patient's tolerance of                            previous anesthesia was also reviewed. The risks                            and benefits of the procedure and the sedation                            options and risks were discussed with the patient.                            All questions were answered, and informed consent                            was obtained. Prior Anticoagulants: The patient has                            taken no previous anticoagulant or antiplatelet                            agents. ASA Grade Assessment: II - A patient with                            mild systemic disease. After reviewing the risks                            and benefits, the patient was deemed in                            satisfactory condition to undergo the procedure.  After obtaining informed consent, the colonoscope                            was passed under direct vision. Throughout the                            procedure, the patient's blood pressure, pulse, and                            oxygen saturations were monitored continuously. The                            Colonoscope was introduced through the anus and                            advanced to the  the cecum, identified by                            appendiceal orifice and ileocecal valve. The                            colonoscopy was performed without difficulty. The                            patient tolerated the procedure well. The quality                            of the bowel preparation was adequate. The                            ileocecal valve, appendiceal orifice, and rectum                            were photographed. Scope In: 9:21:33 AM Scope Out: 9:33:19 AM Scope Withdrawal Time: 0 hours 8 minutes 37 seconds  Total Procedure Duration: 0 hours 11 minutes 46 seconds  Findings:                 The perianal and digital rectal examinations were                            normal.                           A few small-mouthed diverticula were found in the                            sigmoid colon.                           Non-bleeding internal hemorrhoids were found during                            retroflexion. The hemorrhoids were small.  The exam was otherwise normal throughout the                            remainder of the colon. Complications:            No immediate complications. Estimated Blood Loss:     Estimated blood loss: none. Impression:               - Diverticulosis in the sigmoid colon.                           - Non-bleeding internal hemorrhoids.                           - No specimens collected. Recommendation:           - Patient has a contact number available for                            emergencies. The signs and symptoms of potential                            delayed complications were discussed with the                            patient. Return to normal activities tomorrow.                            Written discharge instructions were provided to the                            patient.                           - Resume previous diet.                           - Continue present medications.                            - Repeat colonoscopy in 10 years for screening                            purposes.                           - Return to GI office PRN. Doristine LocksVito Jasmain Ahlberg, MD 02/02/2019 9:37:18 AM

## 2019-02-04 ENCOUNTER — Telehealth: Payer: Self-pay

## 2019-02-04 ENCOUNTER — Telehealth: Payer: Self-pay | Admitting: *Deleted

## 2019-02-04 NOTE — Telephone Encounter (Signed)
  Follow up Call-  Call back number 02/02/2019  Post procedure Call Back phone  # [phone]  Permission to leave phone message Yes     Patient questions:  Do you have a fever, pain , or abdominal swelling? No. Pain Score  0 *  Have you tolerated food without any problems? Yes.    Have you been able to return to your normal activities? Yes.    Do you have any questions about your discharge instructions: Diet   No. Medications  No. Follow up visit  No.  Do you have questions or concerns about your Care? No.  Actions: * If pain score is 4 or above: No action needed, pain <4. 1. Have you developed a fever since your procedure? no  2.   Have you had an respiratory symptoms (SOB or cough) since your procedure? no  3.   Have you tested positive for COVID 19 since your procedure no  4.   Have you had any family members/close contacts diagnosed with the COVID 19 since your procedure?  no   If yes to any of these questions please route to Joylene John, RN and Alphonsa Gin, Therapist, sports.

## 2019-02-04 NOTE — Telephone Encounter (Signed)
No answer, left message to call back later today, B.Marcel Gary RN. 

## 2019-02-05 ENCOUNTER — Encounter: Payer: Self-pay | Admitting: Family Medicine

## 2019-02-09 ENCOUNTER — Other Ambulatory Visit: Payer: Self-pay

## 2019-02-10 ENCOUNTER — Ambulatory Visit: Payer: Federal, State, Local not specified - PPO | Admitting: Family Medicine

## 2019-02-10 ENCOUNTER — Encounter: Payer: Self-pay | Admitting: Family Medicine

## 2019-02-10 VITALS — BP 120/72 | HR 84 | Ht 64.0 in | Wt 191.0 lb

## 2019-02-10 DIAGNOSIS — E7800 Pure hypercholesterolemia, unspecified: Secondary | ICD-10-CM | POA: Insufficient documentation

## 2019-02-10 DIAGNOSIS — Z23 Encounter for immunization: Secondary | ICD-10-CM | POA: Diagnosis not present

## 2019-02-10 DIAGNOSIS — H90A22 Sensorineural hearing loss, unilateral, left ear, with restricted hearing on the contralateral side: Secondary | ICD-10-CM

## 2019-02-10 DIAGNOSIS — Z0000 Encounter for general adult medical examination without abnormal findings: Secondary | ICD-10-CM

## 2019-02-10 DIAGNOSIS — E559 Vitamin D deficiency, unspecified: Secondary | ICD-10-CM

## 2019-02-10 DIAGNOSIS — H8102 Meniere's disease, left ear: Secondary | ICD-10-CM

## 2019-02-10 LAB — COMPREHENSIVE METABOLIC PANEL
ALT: 32 U/L (ref 0–35)
AST: 29 U/L (ref 0–37)
Albumin: 4.1 g/dL (ref 3.5–5.2)
Alkaline Phosphatase: 100 U/L (ref 39–117)
BUN: 14 mg/dL (ref 6–23)
CO2: 28 mEq/L (ref 19–32)
Calcium: 9.9 mg/dL (ref 8.4–10.5)
Chloride: 104 mEq/L (ref 96–112)
Creatinine, Ser: 0.64 mg/dL (ref 0.40–1.20)
GFR: 94.22 mL/min (ref >60.00)
Glucose, Bld: 88 mg/dL (ref 70–99)
Potassium: 4.3 mEq/L (ref 3.5–5.1)
Sodium: 140 mEq/L (ref 135–145)
Total Bilirubin: 0.4 mg/dL (ref 0.2–1.2)
Total Protein: 6.7 g/dL (ref 6.0–8.3)

## 2019-02-10 LAB — CBC
HCT: 40.4 % (ref 36.0–46.0)
Hemoglobin: 13.6 g/dL (ref 12.0–15.0)
MCHC: 33.6 g/dL (ref 30.0–36.0)
MCV: 88 fl (ref 78.0–100.0)
Platelets: 275 10*3/uL (ref 150.0–400.0)
RBC: 4.59 Mil/uL (ref 3.87–5.11)
RDW: 13.5 % (ref 11.5–15.5)
WBC: 4.3 10*3/uL (ref 4.0–10.5)

## 2019-02-10 LAB — VITAMIN D 25 HYDROXY (VIT D DEFICIENCY, FRACTURES): VITD: 36.5 ng/mL (ref 30.00–100.00)

## 2019-02-10 LAB — LDL CHOLESTEROL, DIRECT: Direct LDL: 145 mg/dL

## 2019-02-10 NOTE — Patient Instructions (Signed)
Preventing High Cholesterol Cholesterol is a white, waxy substance similar to fat that the human body needs to help build cells. The liver makes all the cholesterol that a person's body needs. Having high cholesterol (hypercholesterolemia) increases a person's risk for heart disease and stroke. Extra (excess) cholesterol comes from the food the person eats. High cholesterol can often be prevented with diet and lifestyle changes. If you already have high cholesterol, you can control it with diet and lifestyle changes and with medicine. How can high cholesterol affect me? If you have high cholesterol, deposits (plaques) may build up on the walls of your arteries. The arteries are the blood vessels that carry blood away from your heart. Plaques make the arteries narrower and stiffer. This can limit or block blood flow and cause blood clots to form. Blood clots:  Are tiny balls of cells that form in your blood.  Can move to the heart or brain, causing a heart attack or stroke. Plaques in arteries greatly increase your risk for heart attack and stroke.Making diet and lifestyle changes can reduce your risk for these conditions that may threaten your life. What can increase my risk? This condition is more likely to develop in people who:  Eat foods that are high in saturated fat or cholesterol. Saturated fat is mostly found in: ? Foods that contain animal fat, such as red meat and some dairy products. ? Certain fatty foods made from plants, such as tropical oils.  Are overweight.  Are not getting enough exercise.  Have a family history of high cholesterol. What actions can I take to prevent this? Nutrition   Eat less saturated fat.  Avoid trans fats (partially hydrogenated oils). These are often found in margarine and in some baked goods, fried foods, and snacks bought in packages.  Avoid precooked or cured meat, such as sausages or meat loaves.  Avoid foods and drinks that have added  sugars.  Eat more fruits, vegetables, and whole grains.  Choose healthy sources of protein, such as fish, poultry, lean cuts of red meat, beans, peas, lentils, and nuts.  Choose healthy sources of fat, such as: ? Nuts. ? Vegetable oils, especially olive oil. ? Fish that have healthy fats (omega-3 fatty acids), such as mackerel or salmon. The items listed above may not be a complete list of recommended foods and beverages. Contact a dietitian for more information. Lifestyle  Lose weight if you are overweight. Losing 5-10 lb (2.3-4.5 kg) can help prevent or control high cholesterol. It can also lower your risk for diabetes and high blood pressure. Ask your health care provider to help you with a diet and exercise plan to lose weight safely.  Do not use any products that contain nicotine or tobacco, such as cigarettes, e-cigarettes, and chewing tobacco. If you need help quitting, ask your health care provider.  Limit your alcohol intake. ? Do not drink alcohol if:  Your health care provider tells you not to drink.  You are pregnant, may be pregnant, or are planning to become pregnant. ? If you drink alcohol:  Limit how much you use to:  0-1 drink a day for women.  0-2 drinks a day for men.  Be aware of how much alcohol is in your drink. In the [place] one drink equals one 12 oz bottle of beer (355 mL), one 5 oz glass of wine (148 mL), or one 1 oz glass of hard liquor (44 mL). Activity   Get enough exercise. Each week, do at   least 150 minutes of exercise that takes a medium level of effort (moderate-intensity exercise). ? This is exercise that:  Makes your heart beat faster and makes you breathe harder than usual.  Allows you to still be able to talk. ? You could exercise in short sessions several times a day or longer sessions a few times a week. For example, on 5 days each week, you could walk fast or ride your bike 3 times a day for 10 minutes each time.  Do exercises as told  by your health care provider. Medicines  In addition to diet and lifestyle changes, your health care provider may recommend medicines to help lower cholesterol. This may be a medicine to lower the amount of cholesterol your liver makes. You may need medicine if: ? Diet and lifestyle changes do not lower your cholesterol enough. ? You have high cholesterol and other risk factors for heart disease or stroke.  Take over-the-counter and prescription medicines only as told by your health care provider. General information  Manage your risk factors for high cholesterol. Talk with your health care provider about all your risk factors and how to lower your risk.  Manage other conditions that you have, such as diabetes or high blood pressure (hypertension).  Have blood tests to check your cholesterol levels at regular points in time as told by your health care provider.  Keep all follow-up visits as told by your health care provider. This is important. Where to find more information  American Heart Association: www.heart.org  National Heart, Lung, and Blood Institute: [employer].is Summary  High cholesterol increases your risk for heart disease and stroke. By keeping your cholesterol level low, you can reduce your risk for these conditions.  High cholesterol can often be prevented with diet and lifestyle changes.  Work with your health care provider to manage your risk factors, and have your blood tested regularly. This information is not intended to replace advice given to you by your health care provider. Make sure you discuss any questions you have with your health care provider. Document Released: 05/22/2015 Document Revised: 08/29/2018 Document Reviewed: 01/14/2016 Elsevier Patient Education  2020 [employer]  Meniere Disease  Meniere disease is an inner ear disorder. It causes attacks of a spinning sensation (vertigo), dizziness, and ringing in the ear (tinnitus). It also  causes hearing loss and a feeling of fullness or pressure in the ear. This is a lifelong condition, and it may get worse over time. You may have drop attacks or severe dizziness that makes you fall. A drop attack is when you suddenly fall without losing consciousness and you quickly recover after a few seconds or minutes. What are the causes? This condition is caused by having too much of the fluid that is in your inner ear (endolymph). When fluid builds up in your inner ear, it affects the nerves that control balance and hearing. The reason for the fluid buildup is not known. Possible causes include:  Allergies.  An abnormal reaction of the body's defense system (autoimmune disease).  Viral infection of the inner ear.  Head injury. What increases the risk? You are more likely to develop this condition if:  You are older than age 46.  You have a family history of Meniere disease.  You have a history of autoimmune disease.  You have a history of migraine headaches. What are the signs or symptoms? Symptoms of this condition can come and go and may last for up to 4 hours at a  time. Symptoms usually start in one ear. They may become more frequent and eventually involve both ears. Symptoms can include:  Fullness and pressure in your ear.  Roaring or ringing in your ear.  Vertigo and loss of balance.  Dizziness.  Decreased hearing.  Nausea and vomiting. How is this diagnosed? This condition is diagnosed based on:  A physical exam.  Tests , such as: ? A hearing test (audiogram). ? An electronystagmogram. This tests your balance nerve (vestibular nerve). ? Imaging studies of your inner ear, such as CT scan or MRI. ? Other balance tests, such as rotational or balance platform tests. How is this treated? There is no cure for this condition, but treatment can help to manage your symptoms. Treatment may include:  A low-salt diet. Limiting salt may help to reduce fluid in the body  and relieve symptoms.  Oral or injected medicines to reduce or control: ? Vertigo. ? Nausea. ? Fluid retention. ? Dizziness.  Use of an air pressure pulse generator. This is a machine that sends small pressure pulses into your ear canal.  Hearing aids.  Inner ear surgery. This is rare. When you have symptoms, it can be helpful to lie down on a flat surface and focus your eyes on one object that does not move. Try to stay in that position until your symptoms go away. Follow these instructions at home: Eating and drinking  Eat the same amount of food at the same time every day, including snacks.  Do not skip meals.  Avoid caffeine.  Drink enough fluids to keep your urine clear or pale yellow.  Limit alcoholic drinks to one drink a day for non-pregnant women and 2 drinks a day for men. One drink equals 12 oz of beer, 5 oz of wine, or 1 oz of hard liquor.  Limit the salt (sodium) in your diet as told by your health care provider. Check ingredients and nutrition facts on packaged foods and beverages.  Do not eat foods that contain monosodium glutamate (MSG). General instructions  Do not use any products that contain nicotine or tobacco, such as cigarettes and e-cigarettes. If you need help quitting, ask your health care provider.  Take over-the-counter and prescription medicines only as told by your health care provider.  Find ways to reduce or avoid stress. If you need help with this, ask your health care provider.  Do not drive if you have vertigo or dizziness. Contact a health care provider if:  You have symptoms that last longer than 4 hours.  You have new or worse symptoms. Get help right away if:  You have been vomiting for 24 hours.  You cannot keep fluids down.  You have chest pain or trouble breathing. Summary  Meniere disease is an inner ear disorder. It causes attacks of a spinning sensation (vertigo), dizziness, and ringing in the ear (tinnitus). It also  causes hearing loss and a feeling of fullness or pressure in the ear.  Symptoms of this condition can come and go and may last for up to 4 hours at a time.  When you have symptoms, it can be helpful to lie down on a flat surface and focus your eyes on one object that does not move. Try to stay in that position until your symptoms go away. This information is not intended to replace advice given to you by your health care provider. Make sure you discuss any questions you have with your health care provider. Document Released: 05/04/2000 Document Revised:  04/19/2017 Document Reviewed: 03/28/2016 Elsevier Patient Education  2020 [employer]. How to Perform the Epley Maneuver The Epley maneuver is an exercise that relieves symptoms of vertigo. Vertigo is the feeling that you or your surroundings are moving when they are not. When you feel vertigo, you may feel like the room is spinning and have trouble walking. Dizziness is a little different than vertigo. When you are dizzy, you may feel unsteady or light-headed. You can do this maneuver at home whenever you have symptoms of vertigo. You can do it up to 3 times a day until your symptoms go away. Even though the Epley maneuver may relieve your vertigo for a few weeks, it is possible that your symptoms will return. This maneuver relieves vertigo, but it does not relieve dizziness. What are the risks? If it is done correctly, the Epley maneuver is considered safe. Sometimes it can lead to dizziness or nausea that goes away after a short time. If you develop other symptoms, such as changes in vision, weakness, or numbness, stop doing the maneuver and call your health care provider. How to perform the Epley maneuver 1. Sit on the edge of a bed or table with your back straight and your legs extended or hanging over the edge of the bed or table. 2. Turn your head halfway toward the affected ear or side. 3. Lie backward quickly with your head turned until  you are lying flat on your back. You may want to position a pillow under your shoulders. 4. Hold this position for 30 seconds. You may experience an attack of vertigo. This is normal. 5. Turn your head to the opposite direction until your unaffected ear is facing the floor. 6. Hold this position for 30 seconds. You may experience an attack of vertigo. This is normal. Hold this position until the vertigo stops. 7. Turn your whole body to the same side as your head. Hold for another 30 seconds. 8. Sit back up. You can repeat this exercise up to 3 times a day. Follow these instructions at home:  After doing the Epley maneuver, you can return to your normal activities.  Ask your health care provider if there is anything you should do at home to prevent vertigo. He or she may recommend that you: ? Keep your head raised (elevated) with two or more pillows while you sleep. ? Do not sleep on the side of your affected ear. ? Get up slowly from bed. ? Avoid sudden movements during the day. ? Avoid extreme head movement, like looking up or bending over. Contact a health care provider if:  Your vertigo gets worse.  You have other symptoms, including: ? Nausea. ? Vomiting. ? Headache. Get help right away if:  You have vision changes.  You have a severe or worsening headache or neck pain.  You cannot stop vomiting.  You have new numbness or weakness in any part of your body. Summary  Vertigo is the feeling that you or your surroundings are moving when they are not.  The Epley maneuver is an exercise that relieves symptoms of vertigo.  If the Epley maneuver is done correctly, it is considered safe. You can do it up to 3 times a day. This information is not intended to replace advice given to you by your health care provider. Make sure you discuss any questions you have with your health care provider. Document Released: 05/12/2013 Document Revised: 04/19/2017 Document Reviewed: 03/27/2016  Elsevier Patient Education  2020 [employer].

## 2019-02-10 NOTE — Progress Notes (Signed)
Established Patient Office Visit  Subjective:  Patient ID: Heather Villegas, female    DOB: [birth year]  Age: 63 y.o. MRN: [id number]  CC:  Chief Complaint  Patient presents with  . Follow-up    HPI Heather Villegas presents for patient reports ongoing spinning sensation with decreased hearing and tinnitus in her left ear.  She is deaf in the right ear.  She experiences a headache from time to time when she bends over to pick something up.  The headache is located in the left temporal proximal area of her head.  Headache is nonprogressive.  It resolves spontaneously.  She has adopted a heart healthy diet and lowering the fat and cholesterol in her diet.  She has been walking on her job and at home with her dog.  She is finding it easy to obtain 10,000 steps daily.  She is lost 11 pounds!  She is in need of a eye check and has not found a dentist as of yet.  Past Medical History:  Diagnosis Date  . Deafness in right ear   . Heart murmur   . Hyperlipidemia    diet  controlled  . Post-operative nausea and vomiting   . Vertigo    To ED Friday night 9-10- was given Meclazine - improved     Past Surgical History:  Procedure Laterality Date  . ABDOMINAL HYSTERECTOMY     2 attempts/ unable to remove uterus/ had a lot bleeding until ablation was done  . ABLATION    . bowel reconstruction  2003   had adhesions/ removed part of small intestine  . BREAST BIOPSY  2013   left/ precancerous lesion  . CATARACT EXTRACTION     left eye  . KIDNEY STONE SURGERY  2018   lithotripsy  . TONSILLECTOMY    . TUBAL LIGATION    . ureter reconnection    . URETERAL REIMPLANTION      Family History  Problem Relation Age of Onset  . Lung cancer Mother   . Lung cancer Father   . Thrombosis Father   . Drug abuse Brother   . Colon cancer Neg Hx   . Colon polyps Neg Hx   . Esophageal cancer Neg Hx   . Rectal cancer Neg Hx   . Stomach cancer Neg Hx     Social History   Socioeconomic History  . Marital  status: Married    Spouse name: Not on file  . Number of children: Not on file  . Years of education: Not on file  . Highest education level: Not on file  Occupational History  . Not on file  Social Needs  . Financial resource strain: Not on file  . Food insecurity    Worry: Not on file    Inability: Not on file  . Transportation needs    Medical: Not on file    Non-medical: Not on file  Tobacco Use  . Smoking status: Former Research scientist (life sciences)  . Smokeless tobacco: Never Used  Substance and Sexual Activity  . Alcohol use: Yes    Comment: Rare  . Drug use: Never  . Sexual activity: Not on file  Lifestyle  . Physical activity    Days per week: Not on file    Minutes per session: Not on file  . Stress: Not on file  Relationships  . Social Herbalist on phone: Not on file    Gets together: Not on file    Attends religious  service: Not on file    Active member of club or organization: Not on file    Attends meetings of clubs or organizations: Not on file    Relationship status: Not on file  . Intimate partner violence    Fear of current or ex partner: Not on file    Emotionally abused: Not on file    Physically abused: Not on file    Forced sexual activity: Not on file  Other Topics Concern  . Not on file  Social History Narrative  . Not on file    Outpatient Medications Prior to Visit  Medication Sig Dispense Refill  . Ascorbic Acid (VITAMIN C PO) Take 500 mg by mouth daily. Take 2 capsules daily    . b complex vitamins tablet Take 1 tablet by mouth daily.    . Coenzyme Q10 (CO Q 10 PO) Take 1 capsule by mouth daily.    . meclizine (ANTIVERT) 25 MG tablet Take 1 tablet (25 mg total) by mouth 3 (three) times daily as needed for dizziness. 30 tablet 0  . Multiple Vitamin (MULTIVITAMIN) capsule Take 1 capsule by mouth daily.    . TURMERIC PO Take 400 mg by mouth daily.    . Vitamin D, Ergocalciferol, (DRISDOL) 1.25 MG (50000 UT) CAPS capsule Take 1 capsule (50,000 Units  total) by mouth every 7 (seven) days. 5 capsule 4   No facility-administered medications prior to visit.     Allergies  Allergen Reactions  . Tape     Surgical/breaks down skin    ROS Review of Systems  Constitutional: Negative for chills, diaphoresis, fatigue, fever and unexpected weight change.  HENT: Positive for hearing loss.   Eyes: Negative for photophobia and visual disturbance.  Respiratory: Negative.   Cardiovascular: Negative.   Gastrointestinal: Negative.   Endocrine: Negative for polyphagia and polyuria.  Genitourinary: Negative for dysuria, frequency and urgency.  Musculoskeletal: Negative for arthralgias and gait problem.  Skin: Negative for pallor and rash.  Allergic/Immunologic: Negative for immunocompromised state.  Neurological: Positive for dizziness and headaches. Negative for seizures and light-headedness.  Psychiatric/Behavioral: Negative.       Objective:    Physical Exam  Constitutional: She is oriented to person, place, and time. She appears well-developed and well-nourished. No distress.  HENT:  Head: Normocephalic and atraumatic.  Right Ear: External ear normal.  Left Ear: External ear normal.  Mouth/Throat: Oropharynx is clear and moist. No oropharyngeal exudate.  Eyes: Pupils are equal, round, and reactive to light. Conjunctivae are normal. Right eye exhibits no discharge. Left eye exhibits no discharge. No scleral icterus.  Neck: Neck supple. No JVD present. No tracheal deviation present. No thyromegaly present.  Cardiovascular: Normal rate, regular rhythm and normal heart sounds.  Pulmonary/Chest: Effort normal and breath sounds normal. No stridor.  Musculoskeletal:        General: No edema.  Lymphadenopathy:    She has no cervical adenopathy.  Neurological: She is alert and oriented to person, place, and time.  Skin: Skin is warm and dry. She is not diaphoretic.  Psychiatric: She has a normal mood and affect. Her behavior is normal.     BP 120/72   Pulse 84   Ht 5\' 4"  (1.626 m)   Wt 191 lb (86.6 kg)   SpO2 98%   BMI 32.79 kg/m  Wt Readings from Last 3 Encounters:  02/10/19 191 lb (86.6 kg)  02/02/19 197 lb (89.4 kg)  01/29/19 195 lb (88.5 kg)   BP Readings from  Last 3 Encounters:  02/10/19 120/72  02/02/19 (!) 124/51  01/30/19 139/65   Guideline developer:  UpToDate (see UpToDate for funding source) Date Released: June 2014  Health Maintenance Due  Topic Date Due  . Hepatitis C Screening  1955/05/27  . HIV Screening  09/19/1972  . TETANUS/TDAP  09/19/1976  . PAP SMEAR-Modifier  09/20/1978  . MAMMOGRAM  09/20/2007    There are no preventive care reminders to display for this patient.  Lab Results  Component Value Date   TSH 2.31 12/04/2018   Lab Results  Component Value Date   WBC 5.0 12/04/2018   HGB 14.0 12/04/2018   HCT 41.5 12/04/2018   MCV 88.9 12/04/2018   PLT 263.0 12/04/2018   Lab Results  Component Value Date   NA 139 12/04/2018   K 4.0 12/04/2018   CO2 27 12/04/2018   GLUCOSE 89 12/04/2018   BUN 15 12/04/2018   CREATININE 0.63 12/04/2018   BILITOT 0.5 12/04/2018   ALKPHOS 88 12/04/2018   AST 22 12/04/2018   ALT 23 12/04/2018   PROT 7.2 12/04/2018   ALBUMIN 4.4 12/04/2018   CALCIUM 9.5 12/04/2018   GFR 96.00 12/04/2018   Lab Results  Component Value Date   CHOL 256 (H) 12/04/2018   Lab Results  Component Value Date   HDL 56.80 12/04/2018   Lab Results  Component Value Date   LDLCALC 170 (H) 12/04/2018   Lab Results  Component Value Date   TRIG 144.0 12/04/2018   Lab Results  Component Value Date   CHOLHDL 4 12/04/2018   No results found for: HGBA1C    Assessment & Plan:   Problem List Items Addressed This Visit      Nervous and Auditory   Meniere's disease of left ear   Relevant Orders   Comprehensive metabolic panel   CBC   MR Brain W Wo Contrast   Ambulatory referral to Neurology     Other   Healthcare maintenance   Relevant Orders    Ambulatory referral to Ophthalmology   Vitamin D deficiency   Relevant Orders   VITAMIN D 25 Hydroxy (Vit-D Deficiency, Fractures)   Elevated LDL cholesterol level   Relevant Orders   LDL cholesterol, direct   Comprehensive metabolic panel   Need for influenza vaccination - Primary   Relevant Orders   Flu Vaccine QUAD 36+ mos IM (Completed)    Other Visit Diagnoses    Sensorineural hearing loss (SNHL) of left ear with restricted hearing of right ear       Relevant Orders   MR Brain W Wo Contrast      No orders of the defined types were placed in this encounter.   Follow-up: Return in about 6 months (around 08/10/2019).   Patient was given information on Mnire's disease and the Epley maneuver.  She was also given information on preventing high cholesterol.  We will follow her up in 6 months and discussed using a statin if her LDL levels have not responded to her continued weight loss and diet therapy.

## 2019-02-12 ENCOUNTER — Encounter: Payer: Self-pay | Admitting: Family Medicine

## 2019-02-12 MED ORDER — MECLIZINE HCL 25 MG PO TABS: 25.0000 mg | ORAL_TABLET | Freq: Three times a day (TID) | ORAL | 0 refills | Status: DC | PRN

## 2019-02-14 ENCOUNTER — Other Ambulatory Visit: Payer: Self-pay

## 2019-02-14 ENCOUNTER — Ambulatory Visit (HOSPITAL_BASED_OUTPATIENT_CLINIC_OR_DEPARTMENT_OTHER)
Admission: RE | Admit: 2019-02-14 | Discharge: 2019-02-14 | Disposition: A | Discharge status: Home / Self Care | Payer: Federal, State, Local not specified - PPO | Source: Ambulatory Visit | Attending: Family Medicine | Admitting: Family Medicine

## 2019-02-14 DIAGNOSIS — H90A22 Sensorineural hearing loss, unilateral, left ear, with restricted hearing on the contralateral side: Secondary | ICD-10-CM | POA: Diagnosis not present

## 2019-02-14 DIAGNOSIS — H8102 Meniere's disease, left ear: Secondary | ICD-10-CM | POA: Insufficient documentation

## 2019-02-14 DIAGNOSIS — H9042 Sensorineural hearing loss, unilateral, left ear, with unrestricted hearing on the contralateral side: Secondary | ICD-10-CM | POA: Diagnosis not present

## 2019-02-14 MED ORDER — GADOBUTROL 1 MMOL/ML IV SOLN
7.5000 mL | Freq: Once | INTRAVENOUS | Status: AC | PRN
  Administered 2019-02-14: 12:00:00 7.5 mL via INTRAVENOUS

## 2019-02-18 ENCOUNTER — Ambulatory Visit
Admission: RE | Admit: 2019-02-18 | Discharge: 2019-02-18 | Disposition: A | Discharge status: Home / Self Care | Payer: Federal, State, Local not specified - PPO | Source: Ambulatory Visit | Attending: Obstetrics & Gynecology | Admitting: Obstetrics & Gynecology

## 2019-02-18 ENCOUNTER — Other Ambulatory Visit: Payer: Self-pay

## 2019-02-18 DIAGNOSIS — Z1231 Encounter for screening mammogram for malignant neoplasm of breast: Secondary | ICD-10-CM

## 2019-03-04 ENCOUNTER — Encounter: Payer: Self-pay | Admitting: Family Medicine

## 2019-03-05 DIAGNOSIS — H903 Sensorineural hearing loss, bilateral: Secondary | ICD-10-CM | POA: Diagnosis not present

## 2019-03-06 ENCOUNTER — Telehealth: Payer: Self-pay | Admitting: Family Medicine

## 2019-03-06 MED ORDER — MECLIZINE HCL 25 MG PO TABS: 25.0000 mg | ORAL_TABLET | Freq: Three times a day (TID) | ORAL | 2 refills | Status: DC | PRN

## 2019-03-06 NOTE — Telephone Encounter (Signed)
Rx sent & replied back to pt.

## 2019-03-06 NOTE — Telephone Encounter (Signed)
Pt has sent mychart message and she is still waiting on refill of meclizine 25 mg. [facility] [place]

## 2019-03-17 ENCOUNTER — Other Ambulatory Visit: Payer: Self-pay

## 2019-03-18 ENCOUNTER — Ambulatory Visit (INDEPENDENT_AMBULATORY_CARE_PROVIDER_SITE_OTHER): Payer: Federal, State, Local not specified - PPO | Admitting: Obstetrics & Gynecology

## 2019-03-18 ENCOUNTER — Encounter: Payer: Self-pay | Admitting: Obstetrics & Gynecology

## 2019-03-18 VITALS — Ht 64.0 in | Wt 164.6 lb

## 2019-03-18 DIAGNOSIS — E663 Overweight: Secondary | ICD-10-CM

## 2019-03-18 DIAGNOSIS — Z01419 Encounter for gynecological examination (general) (routine) without abnormal findings: Secondary | ICD-10-CM

## 2019-03-18 DIAGNOSIS — Z780 Asymptomatic menopausal state: Secondary | ICD-10-CM

## 2019-03-18 NOTE — Progress Notes (Signed)
Heather Villegas 1955-06-12 [id number]   History:    63 y.o. G2P2L2 Married.  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  Has grand-children.  RP:  New patient presenting for annual gyn exam   HPI: Postmenopause, well on no HRT.  No PMB.  No pelvic pain.  No pain with IC.  H/O ureteral reimplantation with secondary severe adhesions.  Urine/BMs normal.  H/O Left breast "precancer" treated with a lumpectomy in 2006 in [place].  Breasts normal.  BMI 28.25.  Lost 16 Lbs in last  3 months with low carb/low cholesterol diet and increased fitness.  Health labs with Fam MD.  Past medical history,surgical history, family history and social history were all reviewed and documented in the EPIC chart.  Gynecologic History No LMP recorded. Patient has had an ablation. Contraception: post menopausal status Last Pap: 2017 normal per patient Last mammogram: 01/2019. Results were: Negative Bone Density: 11/2018 Normal Colonoscopy: 01/2019  Obstetric History OB History  Gravida Para Term Preterm AB Living  2 2       2   SAB TAB Ectopic Multiple Live Births               # Outcome Date GA Lbr Len/2nd Weight Sex Delivery Anes PTL Lv  2 Para           1 Para              ROS: A ROS was performed and pertinent positives and negatives are included in the history.  GENERAL: No fevers or chills. HEENT: No change in vision, no earache, sore throat or sinus congestion. NECK: No pain or stiffness. CARDIOVASCULAR: No chest pain or pressure. No palpitations. PULMONARY: No shortness of breath, cough or wheeze. GASTROINTESTINAL: No abdominal pain, nausea, vomiting or diarrhea, melena or bright red blood per rectum. GENITOURINARY: No urinary frequency, urgency, hesitancy or dysuria. MUSCULOSKELETAL: No joint or muscle pain, no back pain, no recent trauma. DERMATOLOGIC: No rash, no itching, no lesions. ENDOCRINE: No polyuria, polydipsia, no heat or cold intolerance. No recent change in weight. HEMATOLOGICAL: No  anemia or easy bruising or bleeding. NEUROLOGIC: No headache, seizures, numbness, tingling or weakness. PSYCHIATRIC: No depression, no loss of interest in normal activity or change in sleep pattern.     Exam:   Ht 5\' 4"  (1.626 m)   Wt 164 lb 9.6 oz (74.7 kg)   BMI 28.25 kg/m   Body mass index is 28.25 kg/m.  General appearance : Well developed well nourished female. No acute distress HEENT: Eyes: no retinal hemorrhage or exudates,  Neck supple, trachea midline, no carotid bruits, no thyroidmegaly Lungs: Clear to auscultation, no rhonchi or wheezes, or rib retractions  Heart: Regular rate and rhythm, no murmurs or gallops Breast:Examined in sitting and supine position were symmetrical in appearance, no palpable masses or tenderness,  no skin retraction, no nipple inversion, no nipple discharge, no skin discoloration, no axillary or supraclavicular lymphadenopathy Abdomen: no palpable masses or tenderness, no rebound or guarding Extremities: no edema or skin discoloration or tenderness  Pelvic: Vulva: Normal             Vagina: No gross lesions or discharge  Cervix: No gross lesions or discharge.  Pap reflex done.  Uterus  AV, normal size, shape and consistency, non-tender and mobile  Adnexa  Without masses or tenderness  Anus: Normal   Assessment/Plan:  63 y.o. female for annual exam   1. Encounter for routine gynecological examination with Papanicolaou smear of cervix Normal  gynecologic exam.  Pap reflex done.  Breast exam normal.  Last screening mammogram September 2020 was negative.  Colonoscopy September 2020.  Health labs with family physician.  2. Postmenopause Well on no hormone replacement therapy.  No postmenopausal bleeding.  Recent bone density July 2020 was normal.  Continue with vitamin D supplements or good intake and nutrition, calcium intake of 1200 mg daily and regular weightbearing physical activities.  3. Overweight (BMI 25.0-29.9) Already started losing  weight on a low calorie/carb diet, patient will continue that way.  Aerobic physical activities 5 times a week and weightlifting every 2 days.  Princess Bruins MD, 8:49 AM 03/18/2019

## 2019-03-19 LAB — PAP IG W/ RFLX HPV ASCU

## 2019-03-23 ENCOUNTER — Encounter: Payer: Self-pay | Admitting: Obstetrics & Gynecology

## 2019-03-23 NOTE — Patient Instructions (Signed)
1. Encounter for routine gynecological examination with Papanicolaou smear of cervix Normal gynecologic exam.  Pap reflex done.  Breast exam normal.  Last screening mammogram September 2020 was negative.  Colonoscopy September 2020.  Health labs with family physician.  2. Postmenopause Well on no hormone replacement therapy.  No postmenopausal bleeding.  Recent bone density July 2020 was normal.  Continue with vitamin D supplements or good intake and nutrition, calcium intake of 1200 mg daily and regular weightbearing physical activities.  3. Overweight (BMI 25.0-29.9) Already started losing weight on a low calorie/carb diet, patient will continue that way.  Aerobic physical activities 5 times a week and weightlifting every 2 days.  Heather Villegas, it was a pleasure meeting you today!  I will inform you of your results as soon as they are available.

## 2019-04-14 ENCOUNTER — Ambulatory Visit: Payer: Federal, State, Local not specified - PPO | Admitting: Neurology

## 2019-04-21 ENCOUNTER — Other Ambulatory Visit: Payer: Self-pay

## 2019-04-21 ENCOUNTER — Encounter: Payer: Self-pay | Admitting: Neurology

## 2019-04-21 ENCOUNTER — Ambulatory Visit: Payer: Federal, State, Local not specified - PPO | Admitting: Neurology

## 2019-04-21 VITALS — BP 142/68 | HR 67 | Temp 97.6°F | Ht 64.0 in | Wt 185.0 lb

## 2019-04-21 DIAGNOSIS — H8102 Meniere's disease, left ear: Secondary | ICD-10-CM | POA: Diagnosis not present

## 2019-04-21 MED ORDER — ONDANSETRON 4 MG PO TBDP: 4.0000 mg | ORAL_TABLET | Freq: Three times a day (TID) | ORAL | 6 refills | Status: DC | PRN

## 2019-04-21 MED ORDER — ACETAZOLAMIDE 250 MG PO TABS: 250.0000 mg | ORAL_TABLET | Freq: Three times a day (TID) | ORAL | 11 refills | Status: DC

## 2019-04-21 MED ORDER — DIAZEPAM 2 MG PO TABS: 2.0000 mg | ORAL_TABLET | Freq: Two times a day (BID) | ORAL | 0 refills | Status: DC | PRN

## 2019-04-21 NOTE — Patient Instructions (Signed)
Dr. Vicie Mutters [address] [place], [place], [place] [address]  ~1.4 mi [phone]

## 2019-04-21 NOTE — Progress Notes (Addendum)
PATIENT: Heather Villegas DOB: 1956-05-18  Chief Complaint  Patient presents with  . Meniere's Disease    Reports ringing in her right ear, intermittent episodes of vertigo along with nausea.  She uses meclizine with her flare-ups and it helps relieve her symptoms.  [name] PCP    Libby Maw, MD     HISTORICAL  Heather Villegas is a 64 year old female, seen in request by her primary care physician Dr. Ethelene Hal, [facility], for evaluation of Mnire's disease, initial evaluation was on April 21, 2019.  I have reviewed and summarized the referring note from the referring physician.  She has right ear sensorineuronal hearing loss since childhood, has some chronic left ear tinnitus, also had a history of kidney stone, had lithotripsy in 2018.  Since September 2020, she experienced 3 similar episode of sudden onset dizziness, tinnitus, initial episode was on January 29, 2019, after walking his dog for couple miles, she suddenly experienced loud tinnitus in her left ear, spinning sensation, nausea vomiting, cold sweat, lasting for 6 hours, she actually presented to the emergency room, I personally reviewed MRI of the brain with without contrast, there was no significant abnormality.  Laboratory evaluation showed normal CBC, CMP, vitamin D, LDL was 145  Her symptoms quickly improved by IV Valium, and IV Benadryl, she was given a prescription of meclizine as needed,  She later had 2 more episode, but milder, short-lived lasting, lasting 1 to 2 hours, most recent one was in October 2020,  Since dose recurrent episodes of dizziness, she noticed increased left ear tinnitus,  She denies a history of migraine headaches  She reported recent audiology testing showed worsening hearing,  REVIEW OF SYSTEMS: Full 14 system review of systems performed and notable only for as above All other review of systems were negative.  ALLERGIES: Allergies  Allergen Reactions  . Tape     Surgical/breaks down  skin    HOME MEDICATIONS: Current Outpatient Medications  Medication Sig Dispense Refill  . Ascorbic Acid (VITAMIN C PO) Take 500 mg by mouth daily. Take 2 capsules daily    . b complex vitamins tablet Take 1 tablet by mouth daily.    . Coenzyme Q10 (CO Q 10 PO) Take 1 capsule by mouth daily.    . meclizine (ANTIVERT) 25 MG tablet Take 1 tablet (25 mg total) by mouth 3 (three) times daily as needed for dizziness. 30 tablet 2  . Multiple Vitamin (MULTIVITAMIN) capsule Take 1 capsule by mouth daily.    . TURMERIC PO Take 400 mg by mouth daily.    . Vitamin D, Ergocalciferol, (DRISDOL) 1.25 MG (50000 UT) CAPS capsule Take 1 capsule (50,000 Units total) by mouth every 7 (seven) days. 5 capsule 4   No current facility-administered medications for this visit.     PAST MEDICAL HISTORY: Past Medical History:  Diagnosis Date  . Deafness in right ear   . Heart murmur   . Hyperlipidemia    diet  controlled  . Meniere disease   . Post-operative nausea and vomiting   . Vertigo    To ED Friday night 9-10- was given Meclazine - improved     PAST SURGICAL HISTORY: Past Surgical History:  Procedure Laterality Date  . ABDOMINAL HYSTERECTOMY     2 attempts/ unable to remove uterus/ had a lot bleeding until ablation was done  . ABLATION    . bowel reconstruction  2003   had adhesions/ removed part of small intestine  . BREAST BIOPSY  Left 2013   left/ precancerous lesion  . CATARACT EXTRACTION     left eye  . KIDNEY STONE SURGERY  2018   lithotripsy  . TONSILLECTOMY    . TUBAL LIGATION    . ureter reconnection    . URETERAL REIMPLANTION      FAMILY HISTORY: Family History  Problem Relation Age of Onset  . Lung cancer Mother   . Lung cancer Father   . Thrombosis Father   . Drug abuse Brother   . Colon cancer Neg Hx   . Colon polyps Neg Hx   . Esophageal cancer Neg Hx   . Rectal cancer Neg Hx   . Stomach cancer Neg Hx     SOCIAL HISTORY: Social History   Socioeconomic  History  . Marital status: Married    Spouse name: Not on file  . Number of children: 3  . Years of education: 41  . Highest education level: High school graduate  Occupational History  . Occupation: Conservation officer, historic buildings  Social Needs  . Financial resource strain: Not on file  . Food insecurity    Worry: Not on file    Inability: Not on file  . Transportation needs    Medical: Not on file    Non-medical: Not on file  Tobacco Use  . Smoking status: Former Smoker    Types: Cigarettes    Quit date: 1987    Years since quitting: 33.9  . Smokeless tobacco: Never Used  Substance and Sexual Activity  . Alcohol use: Not Currently  . Drug use: Never  . Sexual activity: Not Currently    Comment: 1st intercourse- 20, partners- 4, married  Lifestyle  . Physical activity    Days per week: Not on file    Minutes per session: Not on file  . Stress: Not on file  Relationships  . Social Musician on phone: Not on file    Gets together: Not on file    Attends religious service: Not on file    Active member of club or organization: Not on file    Attends meetings of clubs or organizations: Not on file    Relationship status: Not on file  . Intimate partner violence    Fear of current or ex partner: Not on file    Emotionally abused: Not on file    Physically abused: Not on file    Forced sexual activity: Not on file  Other Topics Concern  . Not on file  Social History Narrative   Lives at home with her husband.   Right-handed.   1-2 cups caffeine per day.     PHYSICAL EXAM   Vitals:   04/21/19 0747  BP: (!) 142/68  Pulse: 67  Temp: 97.6 F (36.4 C)  Weight: 185 lb (83.9 kg)  Height: 5\' 4"  (1.626 m)    Not recorded      Body mass index is 31.76 kg/m.  PHYSICAL EXAMNIATION:  Gen: NAD, conversant, well nourised, well groomed                     Cardiovascular: Regular rate rhythm, no peripheral edema, warm, nontender. Eyes: Conjunctivae clear  without exudates or hemorrhage Neck: Supple, no carotid bruits. Pulmonary: Clear to auscultation bilaterally   NEUROLOGICAL EXAM:  MENTAL STATUS: Speech:    Speech is normal; fluent and spontaneous with normal comprehension.  Cognition:     Orientation to time, place and person  Normal recent and remote memory     Normal Attention span and concentration     Normal Language, naming, repeating,spontaneous speech     Fund of knowledge   CRANIAL NERVES: CN II: Visual fields are full to confrontation.. Pupils are round equal and briskly reactive to light. CN III, IV, VI: extraocular movement are normal. No ptosis. CN V: Facial sensation is intact to pinprick in all 3 divisions bilaterally. Corneal responses are intact.  CN VII: Face is symmetric with normal eye closure and smile. CN VIII: Decreased right ear hearing to tuning fork, air conduction more than bone conduction, CN IX, X: Palate elevates symmetrically. Phonation is normal. CN XI: Head turning and shoulder shrug are intact CN XII: Tongue is midline with normal movements and no atrophy.  MOTOR: There is no pronator drift of out-stretched arms. Muscle bulk and tone are normal. Muscle strength is normal.  REFLEXES: Reflexes are 2+ and symmetric at the biceps, triceps, knees, and ankles. Plantar responses are flexor.  SENSORY: Intact to light touch, pinprick and vibratory sensation are intact in fingers and toes.  COORDINATION: There is no trunk or limb dysmetria noted.  GAIT/STANCE: Posture is normal. Gait is steady with normal steps, base, arm swing, and turning. Heel and toe walking are normal. Tandem gait is normal.  Romberg is absent.   DIAGNOSTIC DATA (LABS, IMAGING, TESTING) - I reviewed patient records, labs, notes, testing and imaging myself where available.   ASSESSMENT AND PLAN  Heather Villegas is a 63 y.o. female   Sudden onset of vertigo, with left ear tinnitus,  Likely Mnire's disease  Not a  candidate for diuretic because history of kidney stonLoreta Villegas   Valium 2 mg as needed, Zofran as needed for symptomatic treatment  Refer her to ENT Dr. Ermalinda BarriosEric Kraus for further evaluation   Heather FeinsteinYijun Bentlie Villegas, M.D. Ph.D.  [facility] [address], [address] [place], [place] [address] Ph: [phone] Fax: [phone]  CC: Mliss SaxKremer, William Alfred, MD  I reviewed ENT PA Jodelle Redharlotta Nordbladh evaluation on May 07, 2019, history most consistent with left-sided Mnire's disease, recommended DASH low-sodium diet, also [facility] the [facility], likely a candidate for gentamicin injection, but need to confirm the site of involvement first.

## 2019-04-30 ENCOUNTER — Telehealth: Payer: Self-pay

## 2019-04-30 NOTE — Telephone Encounter (Signed)
Copied from [place] [id number]. Topic: Referral - Request for Referral >> Apr 30, 2019  8:04 AM Rayann Heman wrote: Has patient seen PCP for this complaint?yes  Referral for which specialty: ENT  Preferred provider/office: Any Dr Ethelene Hal suggest  Reason for referral: vertigo/hearing problems

## 2019-04-30 NOTE — Telephone Encounter (Signed)
Dr. Ethelene Hal, I called pt and informed her your message. Pt states she is aware of that but the referral was for Dr. Vicie Mutters but pt states he is no longer at that practice that is at [facility]. She states they are trying to schedule her with a PA and she would prefer to see a doctor.

## 2019-04-30 NOTE — Telephone Encounter (Signed)
Have referred her to [facility].

## 2019-04-30 NOTE — Addendum Note (Signed)
Addended by: Jon Billings on: 04/30/2019 04:59 PM   Modules accepted: Orders

## 2019-04-30 NOTE — Telephone Encounter (Signed)
Looks like the neurologist has referred her to the ENT.

## 2019-05-01 ENCOUNTER — Emergency Department (HOSPITAL_BASED_OUTPATIENT_CLINIC_OR_DEPARTMENT_OTHER): Payer: Federal, State, Local not specified - PPO

## 2019-05-01 ENCOUNTER — Other Ambulatory Visit: Payer: Self-pay

## 2019-05-01 ENCOUNTER — Encounter (HOSPITAL_BASED_OUTPATIENT_CLINIC_OR_DEPARTMENT_OTHER): Payer: Self-pay | Admitting: Emergency Medicine

## 2019-05-01 ENCOUNTER — Emergency Department (HOSPITAL_BASED_OUTPATIENT_CLINIC_OR_DEPARTMENT_OTHER)
Admission: EM | Admit: 2019-05-01 | Discharge: 2019-05-01 | Disposition: A | Discharge status: Home / Self Care | Payer: Federal, State, Local not specified - PPO | Attending: Emergency Medicine | Admitting: Emergency Medicine

## 2019-05-01 DIAGNOSIS — Z87891 Personal history of nicotine dependence: Secondary | ICD-10-CM | POA: Insufficient documentation

## 2019-05-01 DIAGNOSIS — Z79899 Other long term (current) drug therapy: Secondary | ICD-10-CM | POA: Diagnosis not present

## 2019-05-01 DIAGNOSIS — H8102 Meniere's disease, left ear: Secondary | ICD-10-CM

## 2019-05-01 DIAGNOSIS — R112 Nausea with vomiting, unspecified: Secondary | ICD-10-CM | POA: Diagnosis not present

## 2019-05-01 DIAGNOSIS — R42 Dizziness and giddiness: Secondary | ICD-10-CM | POA: Diagnosis not present

## 2019-05-01 MED ORDER — ONDANSETRON HCL 4 MG/2ML IJ SOLN
4.0000 mg | Freq: Once | INTRAMUSCULAR | Status: AC
  Administered 2019-05-01: 01:00:00 4 mg via INTRAVENOUS
  Filled 2019-05-01: qty 2

## 2019-05-01 MED ORDER — PROMETHAZINE HCL 25 MG RE SUPP: 25.0000 mg | Freq: Four times a day (QID) | RECTAL | 0 refills | Status: DC | PRN

## 2019-05-01 MED ORDER — MECLIZINE HCL 25 MG PO TABS
25.0000 mg | ORAL_TABLET | Freq: Once | ORAL | Status: DC
  Filled 2019-05-01: qty 1

## 2019-05-01 MED ORDER — DIAZEPAM 5 MG/ML IJ SOLN
2.5000 mg | Freq: Once | INTRAMUSCULAR | Status: AC
  Administered 2019-05-01: 01:00:00 2.5 mg via INTRAVENOUS
  Filled 2019-05-01: qty 2

## 2019-05-01 NOTE — ED Triage Notes (Signed)
Pt states she woke up about 2230 with dizziness and nausea  Pt states she took an antiemetic but was unable to hold it down  Pt states she took some valium and could not hold that down either  Pt has hx of vertigo

## 2019-05-01 NOTE — Telephone Encounter (Signed)
Lm for call back.

## 2019-05-01 NOTE — ED Provider Notes (Signed)
[facility] EMERGENCY DEPARTMENT Provider Note   CSN: [id number] Arrival date & time: 05/01/19  0036     History Chief Complaint  Patient presents with  . Dizziness    Heather Villegas is a 63 y.o. female.  The history is provided by the patient.  Dizziness Quality:  Head spinning Severity:  Severe Onset quality:  Sudden Timing:  Constant Progression:  Unchanged Chronicity:  Recurrent Context: head movement   Context: not when bending over, not with loss of consciousness and not with medication   Relieved by:  Nothing Worsened by:  Nothing Ineffective treatments:  None tried Associated symptoms: hearing loss, nausea, tinnitus and vomiting   Associated symptoms: no chest pain, no shortness of breath and no weakness   Associated symptoms comment:  Patient has known Meniere's disease and is having decreasing hearing in the left ear and now has hearing aids.  Risk factors: hx of vertigo and Meniere's disease   Risk factors: no anemia   Patient with meniere's tried to take her home zofran and valium and vomited it up so she came to the ED for relief.  No f/c/r.  No CP.  No weakness, no numbness, no changes in vision or speech.       Past Medical History:  Diagnosis Date  . Deafness in right ear   . Heart murmur   . Hyperlipidemia    diet  controlled  . Meniere disease   . Post-operative nausea and vomiting   . Vertigo    To ED Friday night 9-10- was given Meclazine - improved     Patient Active Problem List   Diagnosis Date Noted  . Meniere's disease of left ear 02/10/2019  . Elevated LDL cholesterol level 02/10/2019  . Need for influenza vaccination 02/10/2019  . Dyslipidemia 01/05/2019  . Vitamin D deficiency 12/05/2018  . DOE (dyspnea on exertion) 12/04/2018  . Heart murmur 12/04/2018  . Healthcare maintenance 12/04/2018    Past Surgical History:  Procedure Laterality Date  . ABDOMINAL HYSTERECTOMY     2 attempts/ unable to remove uterus/ had a lot  bleeding until ablation was done  . ABLATION    . bowel reconstruction  2003   had adhesions/ removed part of small intestine  . BREAST BIOPSY Left 2013   left/ precancerous lesion  . CATARACT EXTRACTION     left eye  . KIDNEY STONE SURGERY  2018   lithotripsy  . TONSILLECTOMY    . TUBAL LIGATION    . ureter reconnection    . URETERAL REIMPLANTION       OB History    Gravida  2   Para  2   Term      Preterm      AB      Living  2     SAB      TAB      Ectopic      Multiple      Live Births              Family History  Problem Relation Age of Onset  . Lung cancer Mother   . Lung cancer Father   . Thrombosis Father   . Drug abuse Brother   . Colon cancer Neg Hx   . Colon polyps Neg Hx   . Esophageal cancer Neg Hx   . Rectal cancer Neg Hx   . Stomach cancer Neg Hx     Social History   Tobacco Use  .  Smoking status: Former Smoker    Types: Cigarettes    Quit date: 1987    Years since quitting: 33.9  . Smokeless tobacco: Never Used  Substance Use Topics  . Alcohol use: Not Currently  . Drug use: Never    Home Medications Prior to Admission medications   Medication Sig Start Date End Date Taking? Authorizing Provider  Ascorbic Acid (VITAMIN C PO) Take 500 mg by mouth daily. Take 2 capsules daily    [provider]  b complex vitamins tablet Take 1 tablet by mouth daily.    [provider]  Coenzyme Q10 (CO Q 10 PO) Take 1 capsule by mouth daily.    [provider]  diazepam (VALIUM) 2 MG tablet Take 1 tablet (2 mg total) by mouth every 12 (twelve) hours as needed (vertigo). 04/21/19   Levert FeinsteinYan, Yijun, MD  meclizine (ANTIVERT) 25 MG tablet Take 1 tablet (25 mg total) by mouth 3 (three) times daily as needed for dizziness. 03/06/19   Mliss SaxKremer, William Alfred, MD  Multiple Vitamin (MULTIVITAMIN) capsule Take 1 capsule by mouth daily.    [provider]  ondansetron (ZOFRAN ODT) 4 MG disintegrating tablet Take 1 tablet  (4 mg total) by mouth every 8 (eight) hours as needed. 04/21/19   Levert FeinsteinYan, Yijun, MD  promethazine (PHENERGAN) 25 MG suppository Place 1 suppository (25 mg total) rectally every 6 (six) hours as needed for nausea or vomiting. 05/01/19   Maimouna Rondeau, MD  TURMERIC PO Take 400 mg by mouth daily.    [provider]  Vitamin D, Ergocalciferol, (DRISDOL) 1.25 MG (50000 UT) CAPS capsule Take 1 capsule (50,000 Units total) by mouth every 7 (seven) days. 12/05/18   Mliss SaxKremer, William Alfred, MD    Allergies    Tape  Review of Systems   Review of Systems  Constitutional: Negative for fever and unexpected weight change.  HENT: Positive for hearing loss and tinnitus.   Eyes: Negative for visual disturbance.  Respiratory: Negative for shortness of breath.   Cardiovascular: Negative for chest pain.  Gastrointestinal: Positive for nausea and vomiting.  Neurological: Positive for dizziness. Negative for facial asymmetry, weakness and numbness.  Psychiatric/Behavioral: Negative for agitation.  All other systems reviewed and are negative.   Physical Exam Updated Vital Signs BP (!) 128/56 (BP Location: Right Arm)   Pulse (!) 51   Temp (!) 97.4 F (36.3 C) (Oral)   Resp 18   Ht 5\' 6"  (1.676 m)   Wt 83.9 kg   SpO2 97%   BMI 29.86 kg/m   Physical Exam Vitals and nursing note reviewed.  Constitutional:      Appearance: Normal appearance. She is normal weight.  HENT:     Head: Normocephalic and atraumatic.     Nose: Nose normal.  Eyes:     Extraocular Movements: Extraocular movements intact.     Conjunctiva/sclera: Conjunctivae normal.     Pupils: Pupils are equal, round, and reactive to light.  Cardiovascular:     Rate and Rhythm: Normal rate and regular rhythm.     Pulses: Normal pulses.     Heart sounds: Normal heart sounds.  Pulmonary:     Effort: Pulmonary effort is normal.  Abdominal:     General: Abdomen is flat. Bowel sounds are normal.     Tenderness: There is no abdominal  tenderness. There is no guarding.  Musculoskeletal:        General: Normal range of motion.     Cervical back: Normal range  of motion and neck supple.  Skin:    General: Skin is warm and dry.     Capillary Refill: Capillary refill takes less than 2 seconds.  Neurological:     General: No focal deficit present.     Mental Status: She is alert and oriented to person, place, and time.     Deep Tendon Reflexes: Reflexes normal.  Psychiatric:        Mood and Affect: Mood normal.        Behavior: Behavior normal.     ED Results / Procedures / Treatments   Labs (all labs ordered are listed, but only abnormal results are displayed) Labs Reviewed - No data to display  EKG None  Radiology CT Head Wo Contrast  Result Date: 05/01/2019 CLINICAL DATA:  Dizziness and nausea EXAM: CT HEAD WITHOUT CONTRAST TECHNIQUE: Contiguous axial images were obtained from the base of the skull through the vertex without intravenous contrast. COMPARISON:  None. FINDINGS: Brain: There is no mass, hemorrhage or extra-axial collection. The size and configuration of the ventricles and extra-axial CSF spaces are normal. The brain parenchyma is normal, without acute or chronic infarction. Vascular: No abnormal hyperdensity of the major intracranial arteries or dural venous sinuses. No intracranial atherosclerosis. Skull: The visualized skull base, calvarium and extracranial soft tissues are normal. Sinuses/Orbits: No fluid levels or advanced mucosal thickening of the visualized paranasal sinuses. No mastoid or middle ear effusion. The orbits are normal. IMPRESSION: Normal head CT. Electronically Signed   By: Ulyses Jarred M.D.   On: 05/01/2019 01:23    Procedures Procedures (including critical care time)  Medications Ordered in ED Medications  meclizine (ANTIVERT) tablet 25 mg (25 mg Oral Not Given 05/01/19 0121)  ondansetron (ZOFRAN) injection 4 mg (4 mg Intravenous Given 05/01/19 0121)  diazepam (VALIUM)  injection 2.5 mg (2.5 mg Intravenous Given 05/01/19 0121)    ED Course  I have reviewed the triage vital signs and the nursing notes.  Pertinent labs & imaging results that were available during my care of the patient were reviewed by me and considered in my medical decision making (see chart for details).  Resting comfortably no further emesis.  This is a classic exacerbation or meniere's disease.  There are no focal neuro deficits.  Her head CT is normal.  She has ringing in the ears and diminished hearing.  She sees neurology for this issues.  I have written for phenergan suppositories.  She cannot take this with zofran.    Final Clinical Impression(s) / ED Diagnoses Final diagnoses:  Meniere's disease of left ear   Return for intractable cough, coughing up blood,fevers >100.4 unrelieved by medication, shortness of breath, intractable vomiting, chest pain, shortness of breath, weakness,numbness, changes in speech, facial asymmetry,abdominal pain, passing out,Inability to tolerate liquids or food, cough, altered mental status or any concerns. No signs of systemic illness or infection. The patient is nontoxic-appearing on exam and vital signs are within normal limits.   I have reviewed the triage vital signs and the nursing notes. Pertinent labs &imaging results that were available during my care of the patient were reviewed by me and considered in my medical decision making (see chart for details).  After history, exam, and medical workup I feel the patient has been appropriately medically screened and is safe for discharge home. Pertinent diagnoses were discussed with the patient. Patient was given return precautions Rx / DC Orders ED Discharge Orders         Ordered  promethazine (PHENERGAN) 25 MG suppository  Every 6 hours PRN     05/01/19 0320           Jhase Creppel, MD 05/01/19 4098

## 2019-05-01 NOTE — Discharge Instructions (Signed)
You cannot take zofran and phenergan you must pick one or the other

## 2019-05-01 NOTE — ED Notes (Signed)
Patient transported to CT 

## 2019-05-01 NOTE — ED Notes (Signed)
ED Provider at bedside. 

## 2019-05-06 NOTE — Telephone Encounter (Signed)
Sent pt a mychart message informing her that new referral was placed

## 2019-05-07 DIAGNOSIS — H9313 Tinnitus, bilateral: Secondary | ICD-10-CM | POA: Diagnosis not present

## 2019-05-07 DIAGNOSIS — H903 Sensorineural hearing loss, bilateral: Secondary | ICD-10-CM | POA: Diagnosis not present

## 2019-05-07 DIAGNOSIS — Z974 Presence of external hearing-aid: Secondary | ICD-10-CM | POA: Diagnosis not present

## 2019-05-07 DIAGNOSIS — R42 Dizziness and giddiness: Secondary | ICD-10-CM | POA: Diagnosis not present

## 2019-05-11 NOTE — Telephone Encounter (Signed)
Pt saw mychart message

## 2019-05-13 DIAGNOSIS — H9313 Tinnitus, bilateral: Secondary | ICD-10-CM | POA: Diagnosis not present

## 2019-05-13 DIAGNOSIS — H832X3 Labyrinthine dysfunction, bilateral: Secondary | ICD-10-CM | POA: Diagnosis not present

## 2019-05-13 DIAGNOSIS — H905 Unspecified sensorineural hearing loss: Secondary | ICD-10-CM | POA: Diagnosis not present

## 2019-05-13 DIAGNOSIS — H8102 Meniere's disease, left ear: Secondary | ICD-10-CM | POA: Diagnosis not present

## 2019-06-15 ENCOUNTER — Encounter (HOSPITAL_BASED_OUTPATIENT_CLINIC_OR_DEPARTMENT_OTHER): Payer: Self-pay | Admitting: Emergency Medicine

## 2019-06-15 ENCOUNTER — Other Ambulatory Visit: Payer: Self-pay

## 2019-06-15 ENCOUNTER — Emergency Department (HOSPITAL_BASED_OUTPATIENT_CLINIC_OR_DEPARTMENT_OTHER)
Admission: EM | Admit: 2019-06-15 | Discharge: 2019-06-16 | Disposition: A | Discharge status: Home / Self Care | Payer: Federal, State, Local not specified - PPO | Attending: Emergency Medicine | Admitting: Emergency Medicine

## 2019-06-15 DIAGNOSIS — Z79899 Other long term (current) drug therapy: Secondary | ICD-10-CM | POA: Insufficient documentation

## 2019-06-15 DIAGNOSIS — E785 Hyperlipidemia, unspecified: Secondary | ICD-10-CM | POA: Diagnosis not present

## 2019-06-15 DIAGNOSIS — R112 Nausea with vomiting, unspecified: Secondary | ICD-10-CM

## 2019-06-15 DIAGNOSIS — R42 Dizziness and giddiness: Secondary | ICD-10-CM | POA: Insufficient documentation

## 2019-06-15 DIAGNOSIS — Z87891 Personal history of nicotine dependence: Secondary | ICD-10-CM | POA: Diagnosis not present

## 2019-06-15 DIAGNOSIS — H903 Sensorineural hearing loss, bilateral: Secondary | ICD-10-CM | POA: Diagnosis not present

## 2019-06-15 MED ORDER — SODIUM CHLORIDE 0.9 % IV BOLUS
500.0000 mL | Freq: Once | INTRAVENOUS | Status: AC
  Administered 2019-06-15: 500 mL via INTRAVENOUS

## 2019-06-15 MED ORDER — MECLIZINE HCL 25 MG PO TABS
25.0000 mg | ORAL_TABLET | Freq: Once | ORAL | Status: AC
  Administered 2019-06-15: 25 mg via ORAL
  Filled 2019-06-15: qty 1

## 2019-06-15 MED ORDER — PROCHLORPERAZINE EDISYLATE 10 MG/2ML IJ SOLN
10.0000 mg | Freq: Once | INTRAMUSCULAR | Status: AC
  Administered 2019-06-15: 10 mg via INTRAVENOUS
  Filled 2019-06-15: qty 2

## 2019-06-15 MED ORDER — ONDANSETRON HCL 4 MG/2ML IJ SOLN
4.0000 mg | Freq: Once | INTRAMUSCULAR | Status: AC
  Administered 2019-06-15: 4 mg via INTRAVENOUS
  Filled 2019-06-15: qty 2

## 2019-06-15 NOTE — ED Notes (Signed)
Pt states she is feeling much better, resting comfortably in her room.

## 2019-06-15 NOTE — ED Provider Notes (Signed)
I assumed care of this patient from Dr. Renaye Rakers.  Please see their note for further details of Hx, PE.  Briefly patient is a 64 y.o. female who presented vertiginous symptoms with N/V from Meniere's dz. Currently being treated symptomatically. Place to reassess and PO challenge.  11:59 PM Patient reports significant relief. Tolerating PO.  The patient appears reasonably screened and/or stabilized for discharge and I doubt any other medical condition or other [facility] requiring further screening, evaluation, or treatment in the ED at this time prior to discharge.  The patient is safe for discharge with strict return precautions.   The patient appears reasonably screened and/or stabilized for discharge and I doubt any other medical condition or other [facility] requiring further screening, evaluation, or treatment in the ED at this time prior to discharge.  Disposition: Discharge  Condition: Good  I have discussed the results, Dx and Tx plan with the patient who expressed understanding and agree(s) with the plan. Discharge instructions discussed at great length. The patient was given strict return precautions who verbalized understanding of the instructions. No further questions at time of discharge.    ED Discharge Orders    None       Follow Up: Mliss Sax, MD [address] [place] [address] [phone]  Schedule an appointment as soon as possible for a visit  As needed            Josua Ferrebee, Amadeo Garnet, MD 06/16/19 0000

## 2019-06-15 NOTE — ED Provider Notes (Signed)
[facility] EMERGENCY DEPARTMENT Provider Note   CSN: [id number] Arrival date & time: 06/15/19  2126     History Chief Complaint  Patient presents with  . Dizziness    Heather Villegas is a 64 y.o. female history of Mnire's disease, presenting to emergency department with vertigo and nausea.  She reports she began having an abrupt episode of vertigo with nausea earlier this morning.  She said this is typical pattern for her Mnire's disease.  She went to see the ENT doctor 1230 2 attempt have an audiogram done.  She went home and took some Valium as well as Zofran.  She says that her vertigo has since stopped but she continues having significant nausea.  She had a few episodes of vomiting.  Her last episode of vomiting had a little bit of blood tension it.  She currently complains only of significant nausea.  She says this is also typical for her vertigo episodes.  She does oftentimes comes to emergency department and received IV medications and is able to fix her nausea.  She prefers not to be admitted to the hospital.  No headache.  HPI     Past Medical History:  Diagnosis Date  . Deafness in right ear   . Heart murmur   . Hyperlipidemia    diet  controlled  . Meniere disease   . Post-operative nausea and vomiting   . Vertigo    To ED Friday night 9-10- was given Meclazine - improved     Patient Active Problem List   Diagnosis Date Noted  . Meniere's disease of left ear 02/10/2019  . Elevated LDL cholesterol level 02/10/2019  . Need for influenza vaccination 02/10/2019  . Dyslipidemia 01/05/2019  . Vitamin D deficiency 12/05/2018  . DOE (dyspnea on exertion) 12/04/2018  . Heart murmur 12/04/2018  . Healthcare maintenance 12/04/2018    Past Surgical History:  Procedure Laterality Date  . ABDOMINAL HYSTERECTOMY     2 attempts/ unable to remove uterus/ had a lot bleeding until ablation was done  . ABLATION    . bowel reconstruction  2003   had adhesions/  removed part of small intestine  . BREAST BIOPSY Left 2013   left/ precancerous lesion  . CATARACT EXTRACTION     left eye  . KIDNEY STONE SURGERY  2018   lithotripsy  . TONSILLECTOMY    . TUBAL LIGATION    . ureter reconnection    . URETERAL REIMPLANTION       OB History    Gravida  2   Para  2   Term      Preterm      AB      Living  2     SAB      TAB      Ectopic      Multiple      Live Births              Family History  Problem Relation Age of Onset  . Lung cancer Mother   . Lung cancer Father   . Thrombosis Father   . Drug abuse Brother   . Colon cancer Neg Hx   . Colon polyps Neg Hx   . Esophageal cancer Neg Hx   . Rectal cancer Neg Hx   . Stomach cancer Neg Hx     Social History   Tobacco Use  . Smoking status: Former Smoker    Types: Cigarettes    Quit date:  1987    Years since quitting: 34.0  . Smokeless tobacco: Never Used  Substance Use Topics  . Alcohol use: Not Currently  . Drug use: Never    Home Medications Prior to Admission medications   Medication Sig Start Date End Date Taking? Authorizing Provider  Ascorbic Acid (VITAMIN C PO) Take 500 mg by mouth daily. Take 2 capsules daily    [provider]  b complex vitamins tablet Take 1 tablet by mouth daily.    [provider]  Coenzyme Q10 (CO Q 10 PO) Take 1 capsule by mouth daily.    [provider]  diazepam (VALIUM) 2 MG tablet Take 1 tablet (2 mg total) by mouth every 12 (twelve) hours as needed (vertigo). 04/21/19   Levert Feinstein, MD  meclizine (ANTIVERT) 25 MG tablet Take 1 tablet (25 mg total) by mouth 3 (three) times daily as needed for dizziness. 03/06/19   Mliss Sax, MD  Multiple Vitamin (MULTIVITAMIN) capsule Take 1 capsule by mouth daily.    [provider]  ondansetron (ZOFRAN ODT) 4 MG disintegrating tablet Take 1 tablet (4 mg total) by mouth every 8 (eight) hours as needed. 04/21/19   Levert Feinstein, MD  promethazine  (PHENERGAN) 25 MG suppository Place 1 suppository (25 mg total) rectally every 6 (six) hours as needed for nausea or vomiting. 05/01/19   Palumbo, April, MD  TURMERIC PO Take 400 mg by mouth daily.    [provider]  Vitamin D, Ergocalciferol, (DRISDOL) 1.25 MG (50000 UT) CAPS capsule Take 1 capsule (50,000 Units total) by mouth every 7 (seven) days. 12/05/18   Mliss Sax, MD    Allergies    Tape  Review of Systems   Review of Systems  Constitutional: Negative for chills and fever.  Respiratory: Negative for cough and shortness of breath.   Cardiovascular: Negative for chest pain and palpitations.  Gastrointestinal: Positive for abdominal pain, nausea and vomiting.  All other systems reviewed and are negative.   Physical Exam Updated Vital Signs BP (!) 158/64 (BP Location: Right Arm)   Pulse 60   Temp 97.8 F (36.6 C) (Oral)   Resp 16   Ht 5\' 6"  (1.676 m)   Wt 81.6 kg   SpO2 98%   BMI 29.05 kg/m   Physical Exam Vitals and nursing note reviewed.  Constitutional:      General: She is not in acute distress.    Appearance: She is well-developed.     Comments: Drowsy  HENT:     Head: Normocephalic and atraumatic.  Eyes:     Conjunctiva/sclera: Conjunctivae normal.  Cardiovascular:     Rate and Rhythm: Normal rate and regular rhythm.  Pulmonary:     Effort: Pulmonary effort is normal. No respiratory distress.  Abdominal:     Tenderness: There is abdominal tenderness in the epigastric area.  Musculoskeletal:     Cervical back: Neck supple.  Skin:    General: Skin is warm and dry.  Neurological:     General: No focal deficit present.     Mental Status: She is alert and oriented to person, place, and time.     Cranial Nerves: No cranial nerve deficit.  Psychiatric:        Mood and Affect: Mood normal.        Behavior: Behavior normal.     ED Results / Procedures / Treatments   Labs (all labs ordered are listed, but only abnormal results are  displayed) Labs Reviewed -  No data to display  EKG None  Radiology No results found.  Procedures Procedures (including critical care time)  Medications Ordered in ED Medications  prochlorperazine (COMPAZINE) injection 10 mg (10 mg Intravenous Given 06/15/19 2305)  ondansetron (ZOFRAN) injection 4 mg (4 mg Intravenous Given 06/15/19 2302)  sodium chloride 0.9 % bolus 500 mL (0 mLs Intravenous Stopped 06/15/19 2357)  meclizine (ANTIVERT) tablet 25 mg (25 mg Oral Given 06/15/19 2307)    ED Course  I have reviewed the triage vital signs and the nursing notes.  Pertinent labs & imaging results that were available during my care of the patient were reviewed by me and considered in my medical decision making (see chart for details).  63 yo female w/ meniere's disease, recurrent vertigo, follows with ENT, presenting with nausea and vomiting in the setting of a severe vertigo episode today.  Her vertigo has completely resolved prior to her arrival (she did take valium this afternoons.  Her nausea continues.  She reports this is a typical pattern for her episodes, and she often comes to the ED and receives IV treatments, with improvement of her symptoms enough to go home.  We'll give her some fluids and anti-emetics here and reassess how she's feeling.  I have a lower suspicion for stroke or CVA without any other focal neurological findings, and given her well known clinical history of peripheral vertigo.  Mild epigastric tenderness is expected in the setting of recent vomiting.  Do not suspect cholecystitis or pancreatitis at this time.   Final Clinical Impression(s) / ED Diagnoses Final diagnoses:  Vertigo  Nausea and vomiting in adult    Rx / DC Orders ED Discharge Orders    None       Odin Mariani, Kermit Balo, MD 06/16/19 1130

## 2019-06-15 NOTE — ED Triage Notes (Signed)
Pt states she is having another episode of vertigo that started this morning  Pt states she has had vomiting since this morning  Pt went to the ENT at 1230 and went back at 4pm  Pt states she has taken the medication for nausea and is unable to hold anything down

## 2019-07-09 ENCOUNTER — Telehealth: Payer: Federal, State, Local not specified - PPO | Admitting: Cardiology

## 2019-07-15 ENCOUNTER — Other Ambulatory Visit: Payer: Self-pay

## 2019-07-15 ENCOUNTER — Encounter: Payer: Self-pay | Admitting: Cardiology

## 2019-07-15 ENCOUNTER — Ambulatory Visit: Payer: Federal, State, Local not specified - PPO | Admitting: Cardiology

## 2019-07-15 VITALS — BP 142/66 | HR 66 | Ht 66.0 in | Wt 183.0 lb

## 2019-07-15 DIAGNOSIS — R0600 Dyspnea, unspecified: Secondary | ICD-10-CM | POA: Diagnosis not present

## 2019-07-15 DIAGNOSIS — H8102 Meniere's disease, left ear: Secondary | ICD-10-CM | POA: Diagnosis not present

## 2019-07-15 DIAGNOSIS — R011 Cardiac murmur, unspecified: Secondary | ICD-10-CM | POA: Diagnosis not present

## 2019-07-15 DIAGNOSIS — R0609 Other forms of dyspnea: Secondary | ICD-10-CM

## 2019-07-15 DIAGNOSIS — E785 Hyperlipidemia, unspecified: Secondary | ICD-10-CM

## 2019-07-15 DIAGNOSIS — I340 Nonrheumatic mitral (valve) insufficiency: Secondary | ICD-10-CM

## 2019-07-15 NOTE — Addendum Note (Signed)
Addended by: Lita Mains on: 07/15/2019 08:40 AM   Modules accepted: Orders

## 2019-07-15 NOTE — Progress Notes (Signed)
Cardiology Office Note:    Date:  07/15/2019   ID:  Heather Villegas, DOB 1956-02-16, MRN [id number]  PCP:  Mliss Sax, MD  Cardiologist:  Gypsy Balsam, MD    Referring MD: Mliss Sax   No chief complaint on file. Doing well cardiac wise  History of Present Illness:    Heather Villegas is a 64 y.o. female who was referred to me because of heart murmur.  Echocardiogram has been done which showed only mild mitral regurgitation, trivial tricuspid regurgitation, normal left ventricle ejection fraction.  Additional problem includes Mnire's disease as well as dyslipidemia.  Overall she is doing well cardiac wise.  Denies having any cardiac symptoms that would suggest worsening of her mitral regurgitation.  No exertional shortness of breath more than usual, no swelling of lower extremities, no palpitations.  Biggest problem that she has is her Mnire's disease that she will get attacks that will make her nauseated for up to 1 day.  That required frequent ER visits.  Past Medical History:  Diagnosis Date  . Deafness in right ear   . Heart murmur   . Hyperlipidemia    diet  controlled  . Meniere disease   . Post-operative nausea and vomiting   . Vertigo    To ED Friday night 9-10- was given Meclazine - improved     Past Surgical History:  Procedure Laterality Date  . ABDOMINAL HYSTERECTOMY     2 attempts/ unable to remove uterus/ had a lot bleeding until ablation was done  . ABLATION    . bowel reconstruction  2003   had adhesions/ removed part of small intestine  . BREAST BIOPSY Left 2013   left/ precancerous lesion  . CATARACT EXTRACTION     left eye  . KIDNEY STONE SURGERY  2018   lithotripsy  . TONSILLECTOMY    . TUBAL LIGATION    . ureter reconnection    . URETERAL REIMPLANTION      Current Medications: Current Meds  Medication Sig  . Ascorbic Acid (VITAMIN C PO) Take 500 mg by mouth daily. Take 2 capsules daily  . b complex vitamins tablet Take 1  tablet by mouth daily.  . Coenzyme Q10 (CO Q 10 PO) Take 1 capsule by mouth daily.  . diazepam (VALIUM) 2 MG tablet Take 1 tablet (2 mg total) by mouth every 12 (twelve) hours as needed (vertigo).  . Multiple Vitamin (MULTIVITAMIN) capsule Take 1 capsule by mouth daily.  . ondansetron (ZOFRAN ODT) 4 MG disintegrating tablet Take 1 tablet (4 mg total) by mouth every 8 (eight) hours as needed.  . promethazine (PHENERGAN) 25 MG suppository Place 1 suppository (25 mg total) rectally every 6 (six) hours as needed for nausea or vomiting.  . Vitamin D, Ergocalciferol, (DRISDOL) 1.25 MG (50000 UT) CAPS capsule Take 1 capsule (50,000 Units total) by mouth every 7 (seven) days.     Allergies:   Tape   Social History   Socioeconomic History  . Marital status: Married    Spouse name: Not on file  . Number of children: 3  . Years of education: 71  . Highest education level: High school graduate  Occupational History  . Occupation: sterile Astronomer  Tobacco Use  . Smoking status: Former Smoker    Types: Cigarettes    Quit date: 1987    Years since quitting: 34.1  . Smokeless tobacco: Never Used  Substance and Sexual Activity  . Alcohol use: Not Currently  . Drug  use: Never  . Sexual activity: Not Currently    Comment: 1st intercourse- 20, partners- 4, married  Other Topics Concern  . Not on file  Social History Narrative   Lives at home with her husband.   Right-handed.   1-2 cups caffeine per day.   Social Determinants of Health   Financial Resource Strain:   . Difficulty of Paying Living Expenses: Not on file  Food Insecurity:   . Worried About Charity fundraiser in the Last Year: Not on file  . Ran Out of Food in the Last Year: Not on file  Transportation Needs:   . Lack of Transportation (Medical): Not on file  . Lack of Transportation (Non-Medical): Not on file  Physical Activity:   . Days of Exercise per Week: Not on file  . Minutes of Exercise per Session: Not on  file  Stress:   . Feeling of Stress : Not on file  Social Connections:   . Frequency of Communication with Friends and Family: Not on file  . Frequency of Social Gatherings with Friends and Family: Not on file  . Attends Religious Services: Not on file  . Active Member of Clubs or Organizations: Not on file  . Attends Archivist Meetings: Not on file  . Marital Status: Not on file     Family History: The patient's family history includes Drug abuse in her brother; Lung cancer in her father and mother; Thrombosis in her father. There is no history of Colon cancer, Colon polyps, Esophageal cancer, Rectal cancer, or Stomach cancer. ROS:   Please see the history of present illness.    All 14 point review of systems negative except as described per history of present illness  EKGs/Labs/Other Studies Reviewed:      Recent Labs: 12/04/2018: TSH 2.31 02/10/2019: ALT 32; BUN 14; Creatinine, Ser 0.64; Hemoglobin 13.6; Platelets 275.0; Potassium 4.3; Sodium 140  Recent Lipid Panel    Component Value Date/Time   CHOL 256 (H) 12/04/2018 0957   TRIG 144.0 12/04/2018 0957   HDL 56.80 12/04/2018 0957   CHOLHDL 4 12/04/2018 0957   VLDL 28.8 12/04/2018 0957   LDLCALC 170 (H) 12/04/2018 0957   LDLDIRECT 145.0 02/10/2019 0859    Physical Exam:    VS:  BP (!) 142/66   Pulse 66   Ht 5\' 6"  (1.676 m)   Wt 183 lb (83 kg)   SpO2 97%   BMI 29.54 kg/m     Wt Readings from Last 3 Encounters:  07/15/19 183 lb (83 kg)  06/15/19 180 lb (81.6 kg)  05/01/19 185 lb (83.9 kg)     GEN:  Well nourished, well developed in no acute distress HEENT: Normal NECK: No JVD; No carotid bruits LYMPHATICS: No lymphadenopathy CARDIAC: RRR, no murmurs, no rubs, no gallops RESPIRATORY:  Clear to auscultation without rales, wheezing or rhonchi  ABDOMEN: Soft, non-tender, non-distended MUSCULOSKELETAL:  No edema; No deformity  SKIN: Warm and dry LOWER EXTREMITIES: no swelling NEUROLOGIC:  Alert and  oriented x 3 PSYCHIATRIC:  Normal affect   ASSESSMENT:    1. Heart murmur   2. Nonrheumatic mitral valve regurgitation   3. DOE (dyspnea on exertion)   4. Meniere's disease of left ear   5. Dyslipidemia    PLAN:    In order of problems listed above:  1. Heart murmur related to mitral regurgitation only mild.  No need to intervene.  No need to use endocarditis prophylaxis, I will see her back  in 1 year at that time we will repeat her echocardiogram. 2. Dyspnea on exertion better.  Her ejection fraction is normal.  He is trying to be a little more active.  She still works.  However, she does have a dog and try to walk her dog on a regular basis. 3. Mnire's disease biggest problem and that is what bothers her the most.  That being followed by ENT team. 4. Dyslipidemia mild elevation of LDL.  She changed her diet quite dramatically but those changes involve mostly elimination of salt because of Mnire's disease.  We will recheck her fasting lipid profile to decide about potential therapy.   Medication Adjustments/Labs and Tests Ordered: Current medicines are reviewed at length with the patient today.  Concerns regarding medicines are outlined above.  No orders of the defined types were placed in this encounter.  Medication changes: No orders of the defined types were placed in this encounter.   Signed, Georgeanna Lea, MD, [facility] 07/15/2019 8:33 AM    [facility]

## 2019-07-15 NOTE — Patient Instructions (Signed)
Medication Instructions:  Your physician recommends that you continue on your current medications as directed. Please refer to the Current Medication list given to you today.  *If you need a refill on your cardiac medications before your next appointment, please call your pharmacy*  Lab Work: Your physician recommends that you return for lab work when fasting : lipid   If you have labs (blood work) drawn today and your tests are completely normal, you will receive your results only by: [name] MyChart Message (if you have MyChart) OR . A paper copy in the mail If you have any lab test that is abnormal or we need to change your treatment, we will call you to review the results.  Testing/Procedures: None.   Follow-Up: At [facility], you and your health needs are our priority.  As part of our continuing mission to provide you with exceptional heart care, we have created designated Provider Care Teams.  These Care Teams include your primary Cardiologist (physician) and Advanced Practice Providers (APPs -  Physician Assistants and Nurse Practitioners) who all work together to provide you with the care you need, when you need it.  Your next appointment:   1 year(s)  The format for your next appointment:   In Person  Provider:   Gypsy Balsam, MD  Other Instructions

## 2019-07-31 DIAGNOSIS — E785 Hyperlipidemia, unspecified: Secondary | ICD-10-CM | POA: Diagnosis not present

## 2019-08-01 LAB — LIPID PANEL
Chol/HDL Ratio: 4.1 ratio (ref 0.0–4.4)
Cholesterol, Total: 243 mg/dL — ABNORMAL HIGH (ref 100–199)
HDL: 60 mg/dL (ref >39)
LDL Chol Calc (NIH): 167 mg/dL — ABNORMAL HIGH (ref 0–99)
Triglycerides: 93 mg/dL (ref 0–149)
VLDL Cholesterol Cal: 16 mg/dL (ref 5–40)

## 2019-08-03 ENCOUNTER — Telehealth: Payer: Self-pay | Admitting: Emergency Medicine

## 2019-08-03 DIAGNOSIS — E785 Hyperlipidemia, unspecified: Secondary | ICD-10-CM

## 2019-08-03 MED ORDER — ATORVASTATIN CALCIUM 10 MG PO TABS: 10.0000 mg | ORAL_TABLET | Freq: Every day | ORAL | 1 refills | Status: DC

## 2019-08-03 NOTE — Telephone Encounter (Signed)
Called patient. Informed her per Dr. Bing Matter of lab results she decided to go ahead a start lipitor 10 mg daily. I also informed her to have her labs rechecked in 6 weeks fasting. She verbally understood. No further questions.

## 2019-08-19 ENCOUNTER — Encounter: Payer: Self-pay | Admitting: Family Medicine

## 2019-08-20 NOTE — Telephone Encounter (Signed)
Unfortunately, I do not know any dentists in the [place] area. I would ask friends and neighbors. I see [facility] in [place].

## 2019-09-16 ENCOUNTER — Telehealth: Payer: Self-pay

## 2019-09-16 LAB — LIPID PANEL
Chol/HDL Ratio: 3 ratio (ref 0.0–4.4)
Cholesterol, Total: 173 mg/dL (ref 100–199)
HDL: 57 mg/dL (ref >39)
LDL Chol Calc (NIH): 96 mg/dL (ref 0–99)
Triglycerides: 113 mg/dL (ref 0–149)
VLDL Cholesterol Cal: 20 mg/dL (ref 5–40)

## 2019-09-16 NOTE — Telephone Encounter (Signed)
-----   Message from Georgeanna Lea, MD sent at 09/16/2019  3:27 PM EDT ----- Cholesterol looks much better than a month ago.  Acceptable, continue present management

## 2019-09-16 NOTE — Telephone Encounter (Signed)
Results released to [email or web address] with questions

## 2019-12-22 ENCOUNTER — Encounter: Payer: Self-pay | Admitting: Nurse Practitioner

## 2019-12-22 ENCOUNTER — Other Ambulatory Visit: Payer: Self-pay

## 2019-12-22 ENCOUNTER — Telehealth (INDEPENDENT_AMBULATORY_CARE_PROVIDER_SITE_OTHER): Payer: Federal, State, Local not specified - PPO | Admitting: Nurse Practitioner

## 2019-12-22 VITALS — Ht 66.0 in | Wt 185.0 lb

## 2019-12-22 DIAGNOSIS — K529 Noninfective gastroenteritis and colitis, unspecified: Secondary | ICD-10-CM | POA: Diagnosis not present

## 2019-12-22 MED ORDER — OMEPRAZOLE MAGNESIUM 20 MG PO TBEC: 20.0000 mg | DELAYED_RELEASE_TABLET | Freq: Two times a day (BID) | ORAL | 0 refills | Status: DC

## 2019-12-22 MED ORDER — ONDANSETRON 4 MG PO TBDP: 4.0000 mg | ORAL_TABLET | Freq: Three times a day (TID) | ORAL | 6 refills | Status: DC | PRN

## 2019-12-22 NOTE — Progress Notes (Signed)
Virtual Visit via Video Note  I connected with@ on 12/22/19 at 11:00 AM EDT by a video enabled telemedicine application and verified that I am speaking with the correct person using two identifiers.  Location: Patient:Home Provider: Office Participants: patient and provider  I discussed the limitations of evaluation and management by telemedicine and the availability of in person appointments. I also discussed with the patient that there may be a patient responsible charge related to this service. The patient expressed understanding and agreed to proceed.  FU:XNATFT and diarrhea x 3days  History of Present Illness: GI Problem The primary symptoms include abdominal pain, nausea and diarrhea. Primary symptoms do not include fever, fatigue, vomiting, melena, jaundice, hematochezia, dysuria, myalgias, arthralgias or rash. The illness began 3 to 5 days ago. The onset was sudden. The problem has been gradually improving.  The abdominal pain began more than 2 days ago. The abdominal pain has been gradually improving since its onset. The abdominal pain is located in the epigastric region. The abdominal pain does not radiate. The abdominal pain is relieved by nothing.  The diarrhea began 3 to 5 days ago. The diarrhea is watery. The diarrhea occurs once per day.  The illness is also significant for anorexia and bloating. The illness does not include chills, dysphagia, odynophagia, constipation, tenesmus, back pain or itching. Associated medical issues do not include GERD, gallstones, irritable bowel syndrome or diverticulitis.   Observations/Objective: Physical Exam Constitutional:      General: She is not in acute distress.    Appearance: She is not ill-appearing.  Cardiovascular:     Pulses: Normal pulses.  Pulmonary:     Effort: Pulmonary effort is normal.  Neurological:     Mental Status: She is alert and oriented to person, place, and time.    Assessment and Plan: Keshonna was seen today  for acute visit.  Diagnoses and all orders for this visit:  Gastroenteritis -     ondansetron (ZOFRAN ODT) 4 MG disintegrating tablet; Take 1 tablet (4 mg total) by mouth every 8 (eight) hours as needed. -     omeprazole (PRILOSEC OTC) 20 MG tablet; Take 1 tablet (20 mg total) by mouth in the morning and at bedtime.    Follow Up Instructions: Food poisoning vs viral gastroenteritis Advised about symptoms management for now: proper hand hygeine, oral hydration and BRAT diet. Call office if no improvement in 10days or sooner if develops blood in stool or worsening pain.   I discussed the assessment and treatment plan with the patient. The patient was provided an opportunity to ask questions and all were answered. The patient agreed with the plan and demonstrated an understanding of the instructions.   The patient was advised to call back or seek an in-person evaluation if the symptoms worsen or if the condition fails to improve as anticipated.  Alysia Penna, NP

## 2019-12-22 NOTE — Patient Instructions (Signed)

## 2020-03-01 MED ORDER — ATORVASTATIN CALCIUM 10 MG PO TABS: 10.0000 mg | ORAL_TABLET | Freq: Every day | ORAL | 1 refills | Status: DC

## 2020-03-04 DIAGNOSIS — H00021 Hordeolum internum right upper eyelid: Secondary | ICD-10-CM | POA: Diagnosis not present

## 2020-03-21 ENCOUNTER — Other Ambulatory Visit: Payer: Self-pay | Admitting: Obstetrics & Gynecology

## 2020-03-21 DIAGNOSIS — Z1231 Encounter for screening mammogram for malignant neoplasm of breast: Secondary | ICD-10-CM

## 2020-04-27 ENCOUNTER — Ambulatory Visit: Payer: Federal, State, Local not specified - PPO

## 2020-05-10 ENCOUNTER — Encounter: Payer: Self-pay | Admitting: Family Medicine

## 2020-06-02 ENCOUNTER — Other Ambulatory Visit: Payer: Self-pay

## 2020-06-03 ENCOUNTER — Encounter: Payer: Self-pay | Admitting: Nurse Practitioner

## 2020-06-03 ENCOUNTER — Ambulatory Visit: Payer: Federal, State, Local not specified - PPO

## 2020-06-03 ENCOUNTER — Ambulatory Visit: Payer: Federal, State, Local not specified - PPO | Admitting: Nurse Practitioner

## 2020-06-03 VITALS — BP 140/70 | HR 70 | Temp 97.2°F | Ht 66.0 in | Wt 198.0 lb

## 2020-06-03 DIAGNOSIS — H8102 Meniere's disease, left ear: Secondary | ICD-10-CM | POA: Diagnosis not present

## 2020-06-03 DIAGNOSIS — H832X3 Labyrinthine dysfunction, bilateral: Secondary | ICD-10-CM | POA: Insufficient documentation

## 2020-06-03 MED ORDER — DIAZEPAM 2 MG PO TABS: 2.0000 mg | ORAL_TABLET | Freq: Two times a day (BID) | ORAL | 0 refills | Status: DC | PRN

## 2020-06-03 NOTE — Progress Notes (Signed)
Subjective:  Patient ID: Heather Villegas, female    DOB: 1956-04-11  Age: 65 y.o. MRN: [id number]  CC: Follow-up (Medication refill needed for valium. )   HPI  Meniere's disease of left ear Evaluated by neurology and ENT. Rehab completed per patient Had about 12 episodes in last 12hrs, but intensity decrease after rehab in August 2021. Symptoms last for 1-2days, and resolve with use of valium and zofran. PMP database reviewed: valium last filled 04/21/19 and 04/23/2019. No other controlled substance filled in last 1year.  Sent valium 2mg , #6, no refill Advised to schedule appt with ENT if increase frequency and/or new symptoms.    Reviewed past Medical, Social and Family history today.  Outpatient Medications Prior to Visit  Medication Sig Dispense Refill  . Ascorbic Acid (VITAMIN C PO) Take 500 mg by mouth daily. Take 2 capsules daily    . b complex vitamins tablet Take 1 tablet by mouth daily.    . Coenzyme Q10 (CO Q 10 PO) Take 1 capsule by mouth daily.    . Multiple Vitamin (MULTIVITAMIN) capsule Take 1 capsule by mouth daily.    omeprazole (PRILOSEC OTC) 20 MG tablet Take 1 tablet (20 mg total) by mouth in the morning and at bedtime. 30 tablet 0  . ondansetron (ZOFRAN ODT) 4 MG disintegrating tablet Take 1 tablet (4 mg total) by mouth every 8 (eight) hours as needed. 20 tablet 6  . promethazine (PHENERGAN) 25 MG suppository Place 1 suppository (25 mg total) rectally every 6 (six) hours as needed for nausea or vomiting. 12 each 0  . Vitamin D, Ergocalciferol, (DRISDOL) 1.25 MG (50000 UT) CAPS capsule Take 1 capsule (50,000 Units total) by mouth every 7 (seven) days. 5 capsule 4  . diazepam (VALIUM) 2 MG tablet Take 1 tablet (2 mg total) by mouth every 12 (twelve) hours as needed (vertigo). 30 tablet 0  . atorvastatin (LIPITOR) 10 MG tablet Take 1 tablet (10 mg total) by mouth daily. 90 tablet 1   No facility-administered medications prior to visit.    ROS See  HPI  Objective:  BP 140/70 (BP Location: Left Arm, Patient Position: Sitting, Cuff Size: Large)   Pulse 70   Temp (!) 97.2 F (36.2 C) (Temporal)   Ht 5\' 6"  (1.676 m)   Wt 198 lb (89.8 kg)   SpO2 97%   BMI 31.96 kg/m   Physical Exam Vitals reviewed.  Neurological:     Mental Status: She is alert and oriented to person, place, and time.     Gait: Gait normal.  Psychiatric:        Mood and Affect: Mood normal.        Behavior: Behavior normal.        Thought Content: Thought content normal.    Assessment & Plan:  This visit occurred during the SARS-CoV-2 public health emergency.  Safety protocols were in place, including screening questions prior to the visit, additional usage of staff PPE, and extensive cleaning of exam room while observing appropriate contact time as indicated for disinfecting solutions.   Myosha was seen today for follow-up.  Diagnoses and all orders for this visit:  Meniere's disease of left ear -     diazepam (VALIUM) 2 MG tablet; Take 1 tablet (2 mg total) by mouth every 12 (twelve) hours as needed (vertigo).    Problem List Items Addressed This Visit      Nervous and Auditory   Meniere's disease of left ear - Primary  Evaluated by neurology and ENT. Rehab completed per patient Had about 12 episodes in last 12hrs, but intensity decrease after rehab in August 2021. Symptoms last for 1-2days, and resolve with use of valium and zofran. PMP database reviewed: valium last filled 04/21/19 and 04/23/2019. No other controlled substance filled in last 1year.  Sent valium 2mg , #6, no refill Advised to schedule appt with ENT if increase frequency and/or new symptoms.       Relevant Medications   diazepam (VALIUM) 2 MG tablet      Follow-up: No follow-ups on file.  , NP

## 2020-06-03 NOTE — Assessment & Plan Note (Signed)
Evaluated by neurology and ENT. Rehab completed per patient Had about 12 episodes in last 12hrs, but intensity decrease after rehab in August 2021. Symptoms last for 1-2days, and resolve with use of valium and zofran. PMP database reviewed: valium last filled 04/21/19 and 04/23/2019. No other controlled substance filled in last 1year.  Sent valium 2mg , #6, no refill Advised to schedule appt with ENT if increase frequency and/or new symptoms.

## 2020-06-03 NOTE — Patient Instructions (Signed)
Schedule appt with ENT if additional refill is needed

## 2020-07-15 ENCOUNTER — Ambulatory Visit
Admission: RE | Admit: 2020-07-15 | Discharge: 2020-07-15 | Disposition: A | Discharge status: Home / Self Care | Payer: Federal, State, Local not specified - PPO | Source: Ambulatory Visit | Attending: Obstetrics & Gynecology | Admitting: Obstetrics & Gynecology

## 2020-07-15 ENCOUNTER — Other Ambulatory Visit: Payer: Self-pay

## 2020-07-15 DIAGNOSIS — Z1231 Encounter for screening mammogram for malignant neoplasm of breast: Secondary | ICD-10-CM

## 2020-08-25 DIAGNOSIS — K080 Exfoliation of teeth due to systemic causes: Secondary | ICD-10-CM | POA: Diagnosis not present

## 2020-10-18 ENCOUNTER — Other Ambulatory Visit: Payer: Self-pay

## 2020-10-19 MED ORDER — ATORVASTATIN CALCIUM 10 MG PO TABS: 10.0000 mg | ORAL_TABLET | Freq: Every day | ORAL | 0 refills | Status: DC

## 2020-11-20 DIAGNOSIS — M549 Dorsalgia, unspecified: Secondary | ICD-10-CM | POA: Diagnosis not present

## 2020-11-20 DIAGNOSIS — Z20822 Contact with and (suspected) exposure to covid-19: Secondary | ICD-10-CM | POA: Diagnosis not present

## 2020-11-20 DIAGNOSIS — R5383 Other fatigue: Secondary | ICD-10-CM | POA: Diagnosis not present

## 2020-11-20 DIAGNOSIS — R0789 Other chest pain: Secondary | ICD-10-CM | POA: Diagnosis not present

## 2020-11-20 DIAGNOSIS — M545 Low back pain, unspecified: Secondary | ICD-10-CM | POA: Diagnosis not present

## 2020-11-20 DIAGNOSIS — B349 Viral infection, unspecified: Secondary | ICD-10-CM | POA: Diagnosis not present

## 2020-11-20 DIAGNOSIS — R110 Nausea: Secondary | ICD-10-CM | POA: Diagnosis not present

## 2020-11-21 DIAGNOSIS — R9431 Abnormal electrocardiogram [ECG] [EKG]: Secondary | ICD-10-CM | POA: Diagnosis not present

## 2020-11-27 ENCOUNTER — Other Ambulatory Visit: Payer: Self-pay

## 2020-11-28 MED ORDER — ATORVASTATIN CALCIUM 10 MG PO TABS: 10.0000 mg | ORAL_TABLET | Freq: Every day | ORAL | 0 refills | Status: DC

## 2020-12-19 ENCOUNTER — Other Ambulatory Visit: Payer: Self-pay

## 2020-12-19 DIAGNOSIS — R112 Nausea with vomiting, unspecified: Secondary | ICD-10-CM | POA: Insufficient documentation

## 2020-12-19 DIAGNOSIS — H9191 Unspecified hearing loss, right ear: Secondary | ICD-10-CM | POA: Insufficient documentation

## 2020-12-19 DIAGNOSIS — R42 Dizziness and giddiness: Secondary | ICD-10-CM | POA: Insufficient documentation

## 2020-12-26 ENCOUNTER — Other Ambulatory Visit: Payer: Self-pay

## 2020-12-26 ENCOUNTER — Encounter: Payer: Self-pay | Admitting: Cardiology

## 2020-12-26 ENCOUNTER — Ambulatory Visit: Payer: Federal, State, Local not specified - PPO | Admitting: Cardiology

## 2020-12-26 VITALS — BP 126/76 | HR 64 | Ht 66.0 in | Wt 197.0 lb

## 2020-12-26 DIAGNOSIS — R0600 Dyspnea, unspecified: Secondary | ICD-10-CM

## 2020-12-26 DIAGNOSIS — I340 Nonrheumatic mitral (valve) insufficiency: Secondary | ICD-10-CM | POA: Diagnosis not present

## 2020-12-26 DIAGNOSIS — R0609 Other forms of dyspnea: Secondary | ICD-10-CM

## 2020-12-26 DIAGNOSIS — E785 Hyperlipidemia, unspecified: Secondary | ICD-10-CM

## 2020-12-26 DIAGNOSIS — H8102 Meniere's disease, left ear: Secondary | ICD-10-CM

## 2020-12-26 MED ORDER — ATORVASTATIN CALCIUM 10 MG PO TABS: 10.0000 mg | ORAL_TABLET | Freq: Every day | ORAL | 3 refills | Status: DC

## 2020-12-26 NOTE — Progress Notes (Signed)
kf

## 2020-12-26 NOTE — Addendum Note (Signed)
Addended by: Reynolds Bowl on: 12/26/2020 09:45 AM   Modules accepted: Orders

## 2020-12-26 NOTE — Progress Notes (Signed)
Cardiology Office Note:    Date:  12/26/2020   ID:  Heather Villegas, DOB 07-23-1955, MRN [id number]  PCP:  Mliss Sax, MD  Cardiologist:  Gypsy Balsam, MD    Referring MD: Mliss Sax,*   Chief Complaint  Patient presents with   Medication Refill    Atorvastatin    History of Present Illness:    Heather Villegas is a 65 y.o. female with past medical history significant for dyslipidemia, Mnire's disease, heart murmur.  She was referred to [place] originally to clarify heart murmur she was found to have only mild mitral regurgitation.  She comes today 2 months for follow-up.  Overall she is doing well.  Denies have any chest pain, tightness, pressure, burning in the chest.  She works a lot and she is very busy doing it have no cardiac complaints.  No swelling of lower extremities no palpitations  Past Medical History:  Diagnosis Date   Deafness in right ear    Heart murmur    Hyperlipidemia    diet  controlled   Meniere disease    Post-operative nausea and vomiting    Vertigo    To ED Friday night 9-10- was given Meclazine - improved     Past Surgical History:  Procedure Laterality Date   ABDOMINAL HYSTERECTOMY     2 attempts/ unable to remove uterus/ had a lot bleeding until ablation was done   ABLATION     bowel reconstruction  2003   had adhesions/ removed part of small intestine   BREAST BIOPSY Left 2013   left/ precancerous lesion   CATARACT EXTRACTION     left eye   KIDNEY STONE SURGERY  2018   lithotripsy   TONSILLECTOMY     TUBAL LIGATION     ureter reconnection     URETERAL REIMPLANTION      Current Medications: Current Meds  Medication Sig   Ascorbic Acid (VITAMIN C PO) Take 500 mg by mouth daily. Take 2 capsules daily   b complex vitamins tablet Take 1 tablet by mouth daily. Unknown strenght   Coenzyme Q10 (CO Q 10 PO) Take 1 capsule by mouth daily. Unknown strenght   diazepam (VALIUM) 2 MG tablet Take 1 tablet (2 mg total) by mouth every  12 (twelve) hours as needed (vertigo).   Multiple Vitamin (MULTIVITAMIN) capsule Take 1 capsule by mouth daily. Unknown strenght   ondansetron (ZOFRAN ODT) 4 MG disintegrating tablet Take 1 tablet (4 mg total) by mouth every 8 (eight) hours as needed. (Patient taking differently: Take 4 mg by mouth every 8 (eight) hours as needed for nausea or vomiting.)     Allergies:   Tape   Social History   Socioeconomic History   Marital status: Married    Spouse name: Not on file   Number of children: 3   Years of education: 12   Highest education level: High school graduate  Occupational History   Occupation: Conservation officer, historic buildings  Tobacco Use   Smoking status: Former    Types: Cigarettes    Quit date: 1987    Years since quitting: 35.6   Smokeless tobacco: Never  Vaping Use   Vaping Use: Never used  Substance and Sexual Activity   Alcohol use: Not Currently   Drug use: Never   Sexual activity: Not Currently    Comment: 1st intercourse- 20, partners- 4, married  Other Topics Concern   Not on file  Social History Narrative   Lives at home  with her husband.   Right-handed.   1-2 cups caffeine per day.   Social Determinants of Health   Financial Resource Strain: Not on file  Food Insecurity: Not on file  Transportation Needs: Not on file  Physical Activity: Not on file  Stress: Not on file  Social Connections: Not on file     Family History: The patient's family history includes Drug abuse in her brother; Lung cancer in her father and mother; Thrombosis in her father. There is no history of Colon cancer, Colon polyps, Esophageal cancer, Rectal cancer, or Stomach cancer. ROS:   Please see the history of present illness.    All 14 point review of systems negative except as described per history of present illness  EKGs/Labs/Other Studies Reviewed:      Recent Labs: No results found for requested labs within last 8760 hours.  Recent Lipid Panel    Component Value  Date/Time   CHOL 173 09/15/2019 0825   TRIG 113 09/15/2019 0825   HDL 57 09/15/2019 0825   CHOLHDL 3.0 09/15/2019 0825   CHOLHDL 4 12/04/2018 0957   VLDL 28.8 12/04/2018 0957   LDLCALC 96 09/15/2019 0825   LDLDIRECT 145.0 02/10/2019 0859    Physical Exam:    VS:  BP 126/76 (BP Location: Left Arm, Patient Position: Sitting)   Pulse 64   Ht 5\' 6"  (1.676 m)   Wt 197 lb (89.4 kg)   SpO2 94%   BMI 31.80 kg/m     Wt Readings from Last 3 Encounters:  12/26/20 197 lb (89.4 kg)  06/03/20 198 lb (89.8 kg)  12/22/19 185 lb (83.9 kg)     GEN:  Well nourished, well developed in no acute distress HEENT: Normal NECK: No JVD; No carotid bruits LYMPHATICS: No lymphadenopathy CARDIAC: RRR, no murmurs, no rubs, no gallops RESPIRATORY:  Clear to auscultation without rales, wheezing or rhonchi  ABDOMEN: Soft, non-tender, non-distended MUSCULOSKELETAL:  No edema; No deformity  SKIN: Warm and dry LOWER EXTREMITIES: no swelling NEUROLOGIC:  Alert and oriented x 3 PSYCHIATRIC:  Normal affect   ASSESSMENT:    1. Nonrheumatic mitral valve regurgitation   2. Meniere's disease of left ear   3. DOE (dyspnea on exertion)   4. Dyslipidemia    PLAN:    In order of problems listed above:  Nonrheumatic mitral valve regurgitation doing well from that point review.  Murmur is very soft.  Next year we will repeat echocardiogram.  No need to do endocarditis prophylaxis.  She does not have any symptomatology suggesting worsening of the problem. Mnire's disease stable. 3.   Dyspnea on exertion doing well from that point view  4.  Dyslipidemia I did review K PN which show me data from year ago with LDL of 96 HDL 57 she is on Lipitor 10 which I will continue at this time to repeat her fasting lipid profile which we will do today   Medication Adjustments/Labs and Tests Ordered: Current medicines are reviewed at length with the patient today.  Concerns regarding medicines are outlined above.  No  orders of the defined types were placed in this encounter.  Medication changes: No orders of the defined types were placed in this encounter.   Signed, 02/21/20, MD, [facility] 12/26/2020 9:33 AM    [facility]

## 2020-12-26 NOTE — Patient Instructions (Signed)
Medication Instructions:  Your physician recommends that you continue on your current medications as directed. Please refer to the Current Medication list given to you today.  *If you need a refill on your cardiac medications before your next appointment, please call your pharmacy*   Lab Work: Your physician recommends that you return for lab work in:  TODAY: Lipids If you have labs (blood work) drawn today and your tests are completely normal, you will receive your results only by: MyChart Message (if you have MyChart) OR A paper copy in the mail If you have any lab test that is abnormal or we need to change your treatment, we will call you to review the results.   Testing/Procedures: None   Follow-Up: At [facility], you and your health needs are our priority.  As part of our continuing mission to provide you with exceptional heart care, we have created designated Provider Care Teams.  These Care Teams include your primary Cardiologist (physician) and Advanced Practice Providers (APPs -  Physician Assistants and Nurse Practitioners) who all work together to provide you with the care you need, when you need it.  We recommend signing up for the patient portal called "MyChart".  Sign up information is provided on this After Visit Summary.  MyChart is used to connect with patients for Virtual Visits (Telemedicine).  Patients are able to view lab/test results, encounter notes, upcoming appointments, etc.  Non-urgent messages can be sent to your provider as well.   To learn more about what you can do with MyChart, go to [email or web address].    Your next appointment:   1 year(s)  The format for your next appointment:   In Person  Provider:   Robert Krasowski, MD   Other Instructions   

## 2020-12-27 LAB — LIPID PANEL
Chol/HDL Ratio: 3.5 ratio (ref 0.0–4.4)
Cholesterol, Total: 232 mg/dL — ABNORMAL HIGH (ref 100–199)
HDL: 66 mg/dL (ref >39)
LDL Chol Calc (NIH): 146 mg/dL — ABNORMAL HIGH (ref 0–99)
Triglycerides: 112 mg/dL (ref 0–149)
VLDL Cholesterol Cal: 20 mg/dL (ref 5–40)

## 2020-12-29 ENCOUNTER — Other Ambulatory Visit: Payer: Self-pay

## 2020-12-29 DIAGNOSIS — E785 Hyperlipidemia, unspecified: Secondary | ICD-10-CM

## 2021-02-20 ENCOUNTER — Emergency Department (HOSPITAL_BASED_OUTPATIENT_CLINIC_OR_DEPARTMENT_OTHER)
Admission: EM | Admit: 2021-02-20 | Discharge: 2021-02-20 | Disposition: A | Discharge status: Home / Self Care | Payer: Federal, State, Local not specified - PPO | Attending: Emergency Medicine | Admitting: Emergency Medicine

## 2021-02-20 ENCOUNTER — Encounter (HOSPITAL_BASED_OUTPATIENT_CLINIC_OR_DEPARTMENT_OTHER): Payer: Self-pay | Admitting: Emergency Medicine

## 2021-02-20 ENCOUNTER — Other Ambulatory Visit: Payer: Self-pay

## 2021-02-20 ENCOUNTER — Emergency Department (HOSPITAL_BASED_OUTPATIENT_CLINIC_OR_DEPARTMENT_OTHER): Payer: Federal, State, Local not specified - PPO

## 2021-02-20 DIAGNOSIS — R1031 Right lower quadrant pain: Secondary | ICD-10-CM | POA: Diagnosis not present

## 2021-02-20 DIAGNOSIS — Z87891 Personal history of nicotine dependence: Secondary | ICD-10-CM | POA: Insufficient documentation

## 2021-02-20 LAB — COMPREHENSIVE METABOLIC PANEL
ALT: 23 U/L (ref 0–44)
AST: 25 U/L (ref 15–41)
Albumin: 4.4 g/dL (ref 3.5–5.0)
Alkaline Phosphatase: 82 U/L (ref 38–126)
Anion gap: 7 (ref 5–15)
BUN: 21 mg/dL (ref 8–23)
CO2: 26 mmol/L (ref 22–32)
Calcium: 9.6 mg/dL (ref 8.9–10.3)
Chloride: 106 mmol/L (ref 98–111)
Creatinine, Ser: 0.64 mg/dL (ref 0.44–1.00)
GFR, Estimated: >60 mL/min (ref >60)
Glucose, Bld: 99 mg/dL (ref 70–99)
Potassium: 4 mmol/L (ref 3.5–5.1)
Sodium: 139 mmol/L (ref 135–145)
Total Bilirubin: 0.5 mg/dL (ref 0.3–1.2)
Total Protein: 7.6 g/dL (ref 6.5–8.1)

## 2021-02-20 LAB — CBC
HCT: 45.2 % (ref 36.0–46.0)
Hemoglobin: 15.3 g/dL — ABNORMAL HIGH (ref 12.0–15.0)
MCH: 30.4 pg (ref 26.0–34.0)
MCHC: 33.8 g/dL (ref 30.0–36.0)
MCV: 89.9 fL (ref 80.0–100.0)
Platelets: 258 10*3/uL (ref 150–400)
RBC: 5.03 MIL/uL (ref 3.87–5.11)
RDW: 13.2 % (ref 11.5–15.5)
WBC: 6.3 10*3/uL (ref 4.0–10.5)
nRBC: 0 % (ref 0.0–0.2)

## 2021-02-20 LAB — URINALYSIS, ROUTINE W REFLEX MICROSCOPIC
Bilirubin Urine: NEGATIVE
Glucose, UA: NEGATIVE mg/dL
Hgb urine dipstick: NEGATIVE
Ketones, ur: NEGATIVE mg/dL
Nitrite: NEGATIVE
Protein, ur: NEGATIVE mg/dL
Specific Gravity, Urine: 1.02 (ref 1.005–1.030)
pH: 5.5 (ref 5.0–8.0)

## 2021-02-20 LAB — URINALYSIS, MICROSCOPIC (REFLEX)

## 2021-02-20 LAB — LIPASE, BLOOD: Lipase: 29 U/L (ref 11–51)

## 2021-02-20 MED ORDER — IOHEXOL 350 MG/ML SOLN
100.0000 mL | Freq: Once | INTRAVENOUS | Status: AC | PRN
  Administered 2021-02-20: 100 mL via INTRAVENOUS

## 2021-02-20 MED ORDER — KETOROLAC TROMETHAMINE 15 MG/ML IJ SOLN
15.0000 mg | Freq: Once | INTRAMUSCULAR | Status: AC
  Administered 2021-02-20: 15 mg via INTRAVENOUS
  Filled 2021-02-20: qty 1

## 2021-02-20 MED ORDER — SODIUM CHLORIDE 0.9 % IV BOLUS
500.0000 mL | Freq: Once | INTRAVENOUS | Status: AC
  Administered 2021-02-20: 500 mL via INTRAVENOUS

## 2021-02-20 NOTE — ED Triage Notes (Signed)
RLQ pain since last night ~2100 and has worsened since. Pt denies n/v/d and constipation. Hx of kidney stones and multiple abd surgeries. No urinary complaints or fever.

## 2021-02-20 NOTE — Discharge Instructions (Addendum)
You were evaluated in the Emergency Department and after careful evaluation, we did not find any emergent condition requiring admission or further testing in the hospital.  Your exam/testing today was overall reassuring.  Recommend Tylenol or Motrin for discomfort and follow-up with your primary care doctor.  Please return to the Emergency Department if you experience any worsening of your condition.  Thank you for allowing [place] to be a part of your care.

## 2021-02-20 NOTE — ED Provider Notes (Signed)
[facility] Emergency Department Provider Note MRN:  [id number]  Arrival date & time: 02/20/21     Chief Complaint   Abdominal Pain   History of Present Illness   Heather Villegas is a 65 y.o. year-old female with a history of multiple abdominal surgeries presenting to the ED with chief complaint of abdominal pain.  Location: Right lower quadrant Duration: About 5 hours Onset: Sudden Timing: Constant pain Description: Dull Severity: Moderate to severe Exacerbating/Alleviating Factors: None Associated Symptoms: None Pertinent Negatives: No fever, no nausea vomiting, no diarrhea constipation, no chest pain or shortness of breath  Additional History: Has had a lot of surgeries and has been told she has a lot of adhesions in her abdomen  Review of Systems  A complete 10 system review of systems was obtained and all systems are negative except as noted in the HPI and PMH.   Patient's Health History    Past Medical History:  Diagnosis Date   Deafness in right ear    Heart murmur    Hyperlipidemia    diet  controlled   Meniere disease    Post-operative nausea and vomiting    Vertigo    To ED Friday night 9-10- was given Meclazine - improved     Past Surgical History:  Procedure Laterality Date   ABDOMINAL HYSTERECTOMY     2 attempts/ unable to remove uterus/ had a lot bleeding until ablation was done   ABLATION     bowel reconstruction  2003   had adhesions/ removed part of small intestine   BREAST BIOPSY Left 2013   left/ precancerous lesion   CATARACT EXTRACTION     left eye   KIDNEY STONE SURGERY  2018   lithotripsy   TONSILLECTOMY     TUBAL LIGATION     ureter reconnection     URETERAL REIMPLANTION      Family History  Problem Relation Age of Onset   Lung cancer Mother    Lung cancer Father    Thrombosis Father    Drug abuse Brother    Colon cancer Neg Hx    Colon polyps Neg Hx    Esophageal cancer Neg Hx    Rectal cancer Neg Hx     Stomach cancer Neg Hx     Social History   Socioeconomic History   Marital status: Married    Spouse name: Not on file   Number of children: 3   Years of education: 12   Highest education level: High school graduate  Occupational History   Occupation: Conservation officer, historic buildings  Tobacco Use   Smoking status: Former    Types: Cigarettes    Quit date: 1987    Years since quitting: 35.7   Smokeless tobacco: Never  Vaping Use   Vaping Use: Never used  Substance and Sexual Activity   Alcohol use: Not Currently   Drug use: Never   Sexual activity: Not Currently    Comment: 1st intercourse- 20, partners- 4, married  Other Topics Concern   Not on file  Social History Narrative   Lives at home with her husband.   Right-handed.   1-2 cups caffeine per day.   Social Determinants of Health   Financial Resource Strain: Not on file  Food Insecurity: Not on file  Transportation Needs: Not on file  Physical Activity: Not on file  Stress: Not on file  Social Connections: Not on file  Intimate Partner Violence: Not on file  Physical Exam   Vitals:   02/20/21 0223 02/20/21 0330  BP: (!) 203/83 (!) 125/52  Pulse: 64 (!) 49  Resp: 20 16  Temp: 98.4 F (36.9 C)   SpO2: 94% 94%    CONSTITUTIONAL: Well-appearing, NAD NEURO:  Alert and oriented x 3, no focal deficits EYES:  eyes equal and reactive ENT/NECK:  no LAD, no JVD CARDIO: Regular rate, well-perfused, normal S1 and S2 PULM:  CTAB no wheezing or rhonchi GI/GU:  normal bowel sounds, non-distended, mild right lower quadrant tenderness to palpation MSK/SPINE:  No gross deformities, no edema SKIN:  no rash, atraumatic PSYCH:  Appropriate speech and behavior  *Additional and/or pertinent findings included in MDM below  Diagnostic and Interventional Summary    EKG Interpretation  Date/Time:    Ventricular Rate:    PR Interval:    QRS Duration:   QT Interval:    QTC Calculation:   R Axis:     Text  Interpretation:         Labs Reviewed  CBC - Abnormal; Notable for the following components:      Result Value   Hemoglobin 15.3 (*)    All other components within normal limits  URINALYSIS, ROUTINE W REFLEX MICROSCOPIC - Abnormal; Notable for the following components:   Leukocytes,Ua SMALL (*)    All other components within normal limits  URINALYSIS, MICROSCOPIC (REFLEX) - Abnormal; Notable for the following components:   Bacteria, UA RARE (*)    Non Squamous Epithelial PRESENT (*)    All other components within normal limits  COMPREHENSIVE METABOLIC PANEL  LIPASE, BLOOD    CT ABDOMEN PELVIS W CONTRAST  Final Result      Medications  ketorolac (TORADOL) 15 MG/ML injection 15 mg (15 mg Intravenous Given 02/20/21 0246)  sodium chloride 0.9 % bolus 500 mL (0 mLs Intravenous Stopped 02/20/21 0352)  iohexol (OMNIPAQUE) 350 MG/ML injection 100 mL (100 mLs Intravenous Contrast Given 02/20/21 0352)     Procedures  /  Critical Care Procedures  ED Course and Medical Decision Making  I have reviewed the triage vital signs, the nursing notes, and pertinent available records from the EMR.  Listed above are laboratory and imaging tests that I personally ordered, reviewed, and interpreted and then considered in my medical decision making (see below for details).  CT to evaluate for obstruction, mass.  History of hysterectomy and appendectomy.     Work-up reassuring, normal labs, normal urinalysis, CT is without acute process.  Patient's pain is well controlled, continues to have normal vital signs and a benign abdomen on exam, appropriate for discharge.  Elmer Sow. Pilar Plate, MD [facility] Emergency Medicine [facility] [email or web address]  Final Clinical Impressions(s) / ED Diagnoses     ICD-10-CM   1. RLQ abdominal pain  R10.31       ED Discharge Orders     None        Discharge Instructions Discussed with and Provided to Patient:     Discharge  Instructions      You were evaluated in the Emergency Department and after careful evaluation, we did not find any emergent condition requiring admission or further testing in the hospital.  Your exam/testing today was overall reassuring.  Recommend Tylenol or Motrin for discomfort and follow-up with your primary care doctor.  Please return to the Emergency Department if you experience any worsening of your condition.  Thank you for allowing [place] to be a part of your care.  Sabas Sous, MD 02/20/21 [phone]

## 2021-06-02 DIAGNOSIS — H43811 Vitreous degeneration, right eye: Secondary | ICD-10-CM | POA: Diagnosis not present

## 2021-06-02 DIAGNOSIS — H26492 Other secondary cataract, left eye: Secondary | ICD-10-CM | POA: Diagnosis not present

## 2021-06-02 DIAGNOSIS — H2511 Age-related nuclear cataract, right eye: Secondary | ICD-10-CM | POA: Diagnosis not present

## 2021-06-09 DIAGNOSIS — H26492 Other secondary cataract, left eye: Secondary | ICD-10-CM | POA: Diagnosis not present

## 2021-07-11 ENCOUNTER — Emergency Department (HOSPITAL_BASED_OUTPATIENT_CLINIC_OR_DEPARTMENT_OTHER): Payer: Federal, State, Local not specified - PPO

## 2021-07-11 ENCOUNTER — Inpatient Hospital Stay (HOSPITAL_BASED_OUTPATIENT_CLINIC_OR_DEPARTMENT_OTHER)
Admission: EM | Admit: 2021-07-11 | Discharge: 2021-07-14 | DRG: 389 | Disposition: A | Discharge status: Home / Self Care | Payer: Federal, State, Local not specified - PPO | Attending: Internal Medicine | Admitting: Internal Medicine

## 2021-07-11 ENCOUNTER — Encounter (HOSPITAL_BASED_OUTPATIENT_CLINIC_OR_DEPARTMENT_OTHER): Payer: Self-pay | Admitting: Emergency Medicine

## 2021-07-11 ENCOUNTER — Other Ambulatory Visit: Payer: Self-pay

## 2021-07-11 DIAGNOSIS — Z20822 Contact with and (suspected) exposure to covid-19: Secondary | ICD-10-CM | POA: Diagnosis present

## 2021-07-11 DIAGNOSIS — R1031 Right lower quadrant pain: Secondary | ICD-10-CM | POA: Diagnosis not present

## 2021-07-11 DIAGNOSIS — I700 Atherosclerosis of aorta: Secondary | ICD-10-CM | POA: Diagnosis not present

## 2021-07-11 DIAGNOSIS — R9431 Abnormal electrocardiogram [ECG] [EKG]: Secondary | ICD-10-CM | POA: Diagnosis not present

## 2021-07-11 DIAGNOSIS — Z79899 Other long term (current) drug therapy: Secondary | ICD-10-CM

## 2021-07-11 DIAGNOSIS — E860 Dehydration: Secondary | ICD-10-CM | POA: Diagnosis present

## 2021-07-11 DIAGNOSIS — K5651 Intestinal adhesions [bands], with partial obstruction: Secondary | ICD-10-CM | POA: Diagnosis not present

## 2021-07-11 DIAGNOSIS — N132 Hydronephrosis with renal and ureteral calculous obstruction: Secondary | ICD-10-CM | POA: Diagnosis not present

## 2021-07-11 DIAGNOSIS — K56609 Unspecified intestinal obstruction, unspecified as to partial versus complete obstruction: Secondary | ICD-10-CM | POA: Diagnosis present

## 2021-07-11 DIAGNOSIS — N1339 Other hydronephrosis: Secondary | ICD-10-CM

## 2021-07-11 DIAGNOSIS — Z91048 Other nonmedicinal substance allergy status: Secondary | ICD-10-CM

## 2021-07-11 DIAGNOSIS — N1330 Unspecified hydronephrosis: Secondary | ICD-10-CM | POA: Diagnosis present

## 2021-07-11 DIAGNOSIS — R7989 Other specified abnormal findings of blood chemistry: Secondary | ICD-10-CM | POA: Diagnosis not present

## 2021-07-11 DIAGNOSIS — K760 Fatty (change of) liver, not elsewhere classified: Secondary | ICD-10-CM | POA: Diagnosis not present

## 2021-07-11 DIAGNOSIS — R1013 Epigastric pain: Secondary | ICD-10-CM | POA: Diagnosis not present

## 2021-07-11 DIAGNOSIS — Z87891 Personal history of nicotine dependence: Secondary | ICD-10-CM | POA: Diagnosis not present

## 2021-07-11 DIAGNOSIS — E785 Hyperlipidemia, unspecified: Secondary | ICD-10-CM | POA: Diagnosis present

## 2021-07-11 DIAGNOSIS — R079 Chest pain, unspecified: Secondary | ICD-10-CM | POA: Diagnosis not present

## 2021-07-11 DIAGNOSIS — Z4682 Encounter for fitting and adjustment of non-vascular catheter: Secondary | ICD-10-CM | POA: Diagnosis not present

## 2021-07-11 DIAGNOSIS — N134 Hydroureter: Secondary | ICD-10-CM | POA: Diagnosis not present

## 2021-07-11 DIAGNOSIS — R1011 Right upper quadrant pain: Secondary | ICD-10-CM | POA: Diagnosis not present

## 2021-07-11 LAB — COMPREHENSIVE METABOLIC PANEL
ALT: 112 U/L — ABNORMAL HIGH (ref 0–44)
ALT: 87 U/L — ABNORMAL HIGH (ref 0–44)
AST: 105 U/L — ABNORMAL HIGH (ref 15–41)
AST: 56 U/L — ABNORMAL HIGH (ref 15–41)
Albumin: 4.2 g/dL (ref 3.5–5.0)
Albumin: 3.9 g/dL (ref 3.5–5.0)
Alkaline Phosphatase: 103 U/L (ref 38–126)
Alkaline Phosphatase: 97 U/L (ref 38–126)
Anion gap: 7 (ref 5–15)
Anion gap: 6 (ref 5–15)
BUN: 14 mg/dL (ref 8–23)
BUN: 13 mg/dL (ref 8–23)
CO2: 26 mmol/L (ref 22–32)
CO2: 29 mmol/L (ref 22–32)
Calcium: 9.5 mg/dL (ref 8.9–10.3)
Calcium: 9.2 mg/dL (ref 8.9–10.3)
Chloride: 104 mmol/L (ref 98–111)
Chloride: 103 mmol/L (ref 98–111)
Creatinine, Ser: 0.61 mg/dL (ref 0.44–1.00)
Creatinine, Ser: 0.62 mg/dL (ref 0.44–1.00)
GFR, Estimated: >60 mL/min (ref >60)
GFR, Estimated: >60 mL/min (ref >60)
Glucose, Bld: 117 mg/dL — ABNORMAL HIGH (ref 70–99)
Glucose, Bld: 96 mg/dL (ref 70–99)
Potassium: 4.2 mmol/L (ref 3.5–5.1)
Potassium: 3.8 mmol/L (ref 3.5–5.1)
Sodium: 137 mmol/L (ref 135–145)
Sodium: 138 mmol/L (ref 135–145)
Total Bilirubin: 0.7 mg/dL (ref 0.3–1.2)
Total Bilirubin: 0.6 mg/dL (ref 0.3–1.2)
Total Protein: 7.9 g/dL (ref 6.5–8.1)
Total Protein: 7 g/dL (ref 6.5–8.1)

## 2021-07-11 LAB — CBC
HCT: 45.1 % (ref 36.0–46.0)
Hemoglobin: 15.4 g/dL — ABNORMAL HIGH (ref 12.0–15.0)
MCH: 30.8 pg (ref 26.0–34.0)
MCHC: 34.1 g/dL (ref 30.0–36.0)
MCV: 90.2 fL (ref 80.0–100.0)
Platelets: 265 10*3/uL (ref 150–400)
RBC: 5 MIL/uL (ref 3.87–5.11)
RDW: 13.1 % (ref 11.5–15.5)
WBC: 7.2 10*3/uL (ref 4.0–10.5)
nRBC: 0 % (ref 0.0–0.2)

## 2021-07-11 LAB — URINALYSIS, ROUTINE W REFLEX MICROSCOPIC
Bilirubin Urine: NEGATIVE
Glucose, UA: NEGATIVE mg/dL
Hgb urine dipstick: NEGATIVE
Ketones, ur: NEGATIVE mg/dL
Nitrite: NEGATIVE
Protein, ur: NEGATIVE mg/dL
Specific Gravity, Urine: 1.02 (ref 1.005–1.030)
pH: 6.5 (ref 5.0–8.0)

## 2021-07-11 LAB — URINALYSIS, MICROSCOPIC (REFLEX)

## 2021-07-11 LAB — TROPONIN I (HIGH SENSITIVITY)
Troponin I (High Sensitivity): 3 ng/L (ref <18)
Troponin I (High Sensitivity): 4 ng/L (ref <18)

## 2021-07-11 LAB — RESP PANEL BY RT-PCR (FLU A&B, COVID) ARPGX2
Influenza A by PCR: NEGATIVE
Influenza B by PCR: NEGATIVE
SARS Coronavirus 2 by RT PCR: NEGATIVE

## 2021-07-11 LAB — MAGNESIUM: Magnesium: 2.3 mg/dL (ref 1.7–2.4)

## 2021-07-11 LAB — PROTIME-INR
INR: 1 (ref 0.8–1.2)
Prothrombin Time: 12.8 seconds (ref 11.4–15.2)

## 2021-07-11 LAB — LIPASE, BLOOD: Lipase: 25 U/L (ref 11–51)

## 2021-07-11 LAB — CK: Total CK: 51 U/L (ref 38–234)

## 2021-07-11 MED ORDER — LIDOCAINE HCL URETHRAL/MUCOSAL 2 % EX GEL
1.0000 "application " | Freq: Once | CUTANEOUS | Status: AC
  Administered 2021-07-11: 1

## 2021-07-11 MED ORDER — ONDANSETRON HCL 4 MG/2ML IJ SOLN
4.0000 mg | Freq: Once | INTRAMUSCULAR | Status: AC | PRN
  Administered 2021-07-11: 4 mg via INTRAVENOUS
  Filled 2021-07-11: qty 2

## 2021-07-11 MED ORDER — SODIUM CHLORIDE 0.9 % IV BOLUS
1000.0000 mL | Freq: Once | INTRAVENOUS | Status: AC
  Administered 2021-07-11: 1000 mL via INTRAVENOUS

## 2021-07-11 MED ORDER — HYDROMORPHONE HCL 1 MG/ML IJ SOLN
0.5000 mg | Freq: Once | INTRAMUSCULAR | Status: AC
  Administered 2021-07-11: 0.5 mg via INTRAVENOUS
  Filled 2021-07-11: qty 1

## 2021-07-11 MED ORDER — HYDROCODONE-ACETAMINOPHEN 5-325 MG PO TABS: 1.0000 | ORAL_TABLET | ORAL | Status: DC | PRN

## 2021-07-11 MED ORDER — ACETAMINOPHEN 325 MG PO TABS: 650.0000 mg | ORAL_TABLET | Freq: Four times a day (QID) | ORAL | Status: DC | PRN

## 2021-07-11 MED ORDER — SODIUM CHLORIDE 0.9 % IV SOLN
75.0000 mL/h | INTRAVENOUS | Status: AC
  Administered 2021-07-11: 75 mL/h via INTRAVENOUS

## 2021-07-11 MED ORDER — FENTANYL CITRATE PF 50 MCG/ML IJ SOSY
50.0000 ug | PREFILLED_SYRINGE | INTRAMUSCULAR | Status: DC | PRN
  Administered 2021-07-11: 50 ug via INTRAVENOUS
  Filled 2021-07-11: qty 1

## 2021-07-11 MED ORDER — IOHEXOL 300 MG/ML  SOLN
100.0000 mL | Freq: Once | INTRAMUSCULAR | Status: AC | PRN
  Administered 2021-07-11: 100 mL via INTRAVENOUS

## 2021-07-11 MED ORDER — ACETAMINOPHEN 650 MG RE SUPP: 650.0000 mg | Freq: Four times a day (QID) | RECTAL | Status: DC | PRN

## 2021-07-11 MED ORDER — HYDROMORPHONE HCL 1 MG/ML IJ SOLN
0.5000 mg | INTRAMUSCULAR | Status: DC | PRN
  Administered 2021-07-11: 0.5 mg via INTRAVENOUS
  Filled 2021-07-11: qty 0.5

## 2021-07-11 MED ORDER — ALUM & MAG HYDROXIDE-SIMETH 200-200-20 MG/5ML PO SUSP
30.0000 mL | Freq: Once | ORAL | Status: AC
  Administered 2021-07-11: 30 mL via ORAL
  Filled 2021-07-11: qty 30

## 2021-07-11 NOTE — Consult Note (Addendum)
Consult Note  Heather Villegas 12/15/1955  [id number].    Requesting MD: Dr. Deretha EmoryZackowski Chief Complaint/Reason for Consult: Small bowel obstruction  HPI:  66 year old female with medical history significant for heart murmur, vertigo, Mnire's disease, hysterectomy, ureteral reimplantation who [facility] department due to upper abdominal pain.  Onset of pain was Saturday and had been worsening to the point of becoming more constant. Overnight Monday/Tuesday pain greatly increased in severity prompting her presentation to the ED. She has also had nausea but no emesis.  She had been passing flatus and having normal bowel movements up until Tuesday. She was passing flatus yesterday and last BM Monday - 2 days ago.  She denies melena or hematochezia.  Work-up in ED significant for lipase normal, AST/ALT elevated to 105/112, WBC normal, CT scan showing small bowel dilatation measuring up to 4 cm concerning for small bowel obstruction likely secondary to adhesions with relative transition point in the mid lower abdomen.  Patient has been admitted to the [facility] hospital[facility] service and [facility] and treatment.  General surgery asked to see in regard to small bowel obstruction.  Patient states currently nausea has resolved and no further abdominal pain. She has not passed flatus today.    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 Due to adhesions she had small bowel resection at that time.  Substance use: Former smoker Allergies: Tape Blood thinners: not on anticoagulation Past Surgeries: Abdominal hysterectomy - failed, tubal ligation, ureteral reimplantation   ROS: Review of Systems  Constitutional:   Negative for chills and fever.  Respiratory:  Negative for cough, shortness of breath and wheezing.   Cardiovascular:  Negative for chest pain, palpitations and leg swelling.  Gastrointestinal:  Positive for abdominal pain and nausea. Negative for blood in stool, constipation, diarrhea, melena and vomiting.  Genitourinary: Negative.    Family History  Problem Relation Age of Onset   Lung cancer Mother    Lung cancer Father    Thrombosis Father    Drug abuse Brother    Colon cancer Neg Hx    Colon polyps Neg Hx    Esophageal cancer Neg Hx    Rectal cancer Neg Hx    Stomach cancer Neg Hx     Past Medical History:  Diagnosis Date   Deafness in right ear    Heart murmur    Hyperlipidemia    diet  controlled   Meniere disease    Post-operative nausea and vomiting    Vertigo    To ED Friday night 9-10- was given Meclazine - improved     Past Surgical History:  Procedure Laterality Date   ABDOMINAL HYSTERECTOMY     2 attempts/ unable to remove uterus/ had a lot bleeding until ablation was done   ABLATION     bowel reconstruction  2003   had adhesions/ removed part of small intestine   BREAST BIOPSY Left 2013   left/ precancerous lesion   CATARACT EXTRACTION     left eye   KIDNEY STONE SURGERY  2018   lithotripsy   TONSILLECTOMY     TUBAL LIGATION     ureter reconnection     URETERAL REIMPLANTION      Social  History:  reports that she quit smoking about 36 years ago. Her smoking use included cigarettes. She has never used smokeless tobacco. She reports that she does not currently use alcohol. She reports that she does not use drugs.  Allergies:  Allergies  Allergen Reactions   Tape     Surgical/breaks down skin    (Not in a hospital admission)   Blood pressure (!) 140/54, pulse (!) 53, temperature 98.2 F (36.8 C), resp. rate 13, height 5\' 6"  (1.676 m), weight 87.5 kg, SpO2 98 %. Physical Exam: General: pleasant, WD, female who is laying in bed in NAD HEENT:  head is normocephalic, atraumatic.  Sclera are noninjected.  Pupils equal and round. EOMs intact.  Ears and nose without any masses or lesions.  Mouth is pink and moist Heart: regular, rate, and rhythm.  Normal s1,s2. No obvious murmurs, gallops, or rubs noted.  Palpable radial and pedal pulses bilaterally Lungs: CTAB, no wheezes, rhonchi, or rales noted.  Respiratory effort nonlabored Abd: soft, NT, ND, +BS, no masses, hernias, or organomegaly. NGT in place with yellow fluid in cannister MSK: all 4 extremities are symmetrical with no cyanosis, clubbing, or edema. Skin: warm and dry with no masses, lesions, or rashes Neuro: Cranial nerves 2-12 grossly intact, sensation is normal throughout Psych: A&Ox3 with an appropriate affect.    Results for orders placed or performed during the hospital encounter of 07/11/21 (from the past 48 hour(s))  Lipase, blood     Status: None   Collection Time: 07/11/21  8:29 AM  Result Value Ref Range   Lipase 25 11 - 51 U/L    Comment: Performed at [facility], [address] [place], [place] [address]  Comprehensive metabolic panel     Status: Abnormal   Collection Time: 07/11/21  8:29 AM  Result Value Ref Range   Sodium 137 135 - 145 mmol/L   Potassium 4.2 3.5 - 5.1 mmol/L   Chloride 104 98 - 111 mmol/L   CO2 26 22 - 32 mmol/L   Glucose, Bld 117 (H) 70 - 99 mg/dL    Comment: Glucose reference range applies only to samples taken after fasting for at least 8 hours.   BUN 14 8 - 23 mg/dL   Creatinine, Ser 5.92 0.44 - 1.00 mg/dL   Calcium 9.5 8.9 - 92.4 mg/dL   Total Protein 7.9 6.5 - 8.1 g/dL   Albumin 4.2 3.5 - 5.0 g/dL   AST 462 (H) 15 - 41 U/L   ALT 112 (H) 0 - 44 U/L   Alkaline Phosphatase 103 38 - 126 U/L   Total Bilirubin 0.7 0.3 - 1.2 mg/dL   GFR, Estimated >86 >38 mL/min    Comment: (NOTE) Calculated using the CKD-EPI Creatinine Equation (2021)    Anion gap 7 5 - 15    Comment: Performed at [facility], [address] [place], [place] [address]  CBC     Status: Abnormal   Collection Time: 07/11/21  8:29 AM  Result Value Ref Range   WBC 7.2 4.0 - 10.5 K/uL   RBC 5.00 3.87 - 5.11 MIL/uL   Hemoglobin 15.4 (H) 12.0 - 15.0 g/dL   HCT 65.7 90.3 - 83.3 %   MCV 90.2 80.0 - 100.0 fL   MCH 30.8 26.0 - 34.0 pg   MCHC 34.1 30.0 - 36.0 g/dL   RDW 38.3 29.1 - 91.6 %   Platelets 265 150 - 400 K/uL  nRBC 0.0 0.0 - 0.2 %    Comment: Performed at [facility], [address] [place], [place] [address]  Troponin I (High Sensitivity)     Status: None   Collection Time: 07/11/21  8:29 AM  Result Value Ref Range   Troponin I (High Sensitivity) 3 <18 ng/L    Comment: (NOTE) Elevated high sensitivity troponin I (hsTnI) values and significant  changes across serial measurements may suggest ACS but many other  chronic and acute conditions are known to elevate hsTnI results.  Refer to the "Links" section for chest pain algorithms and additional  guidance. Performed at [facility], [address] [place], [place] [address]   Urinalysis, Routine w reflex microscopic Urine, Clean Catch     Status: Abnormal   Collection Time: 07/11/21  9:10 AM  Result Value Ref Range   Color, Urine YELLOW YELLOW   APPearance CLEAR CLEAR   Specific Gravity, Urine 1.020 1.005 - 1.030   pH 6.5 5.0 - 8.0   Glucose, UA NEGATIVE NEGATIVE mg/dL   Hgb urine dipstick NEGATIVE NEGATIVE   Bilirubin Urine NEGATIVE NEGATIVE   Ketones, ur NEGATIVE NEGATIVE mg/dL   Protein, ur NEGATIVE NEGATIVE mg/dL   Nitrite NEGATIVE NEGATIVE   Leukocytes,Ua TRACE (A) NEGATIVE    Comment: Performed at [facility], [address] [place], [place] [address]  Urinalysis, Microscopic (reflex)     Status: Abnormal   Collection Time: 07/11/21  9:10 AM  Result Value Ref Range   RBC / HPF 0-5 0 - 5 RBC/hpf   WBC, UA 0-5 0 - 5 WBC/hpf   Bacteria, UA RARE (A) NONE SEEN   Squamous Epithelial / LPF 0-5 0 - 5   Mucus PRESENT      Comment: Performed at [facility], [address] [place], [place] [address]  Troponin I (High Sensitivity)     Status: None   Collection Time: 07/11/21 10:34 AM  Result Value Ref Range   Troponin I (High Sensitivity) 4 <18 ng/L    Comment: (NOTE) Elevated high sensitivity troponin I (hsTnI) values and significant  changes across serial measurements may suggest ACS but many other  chronic and acute conditions are known to elevate hsTnI results.  Refer to the "Links" section for chest pain algorithms and additional  guidance. Performed at [facility], [address] [place], [place] [address]    DG Chest 2 View  Result Date: 07/11/2021 CLINICAL DATA:  epigastric pain EXAM: CHEST - 2 VIEW COMPARISON:  None. FINDINGS: Low lung volumes. No visible pleural effusions or pneumothorax. Cardiomediastinal silhouette is within normal limits. No evidence of acute osseous abnormality. IMPRESSION: Low lung volumes without evidence of acute cardiopulmonary disease. Electronically Signed   By: Feliberto Harts M.D.   On: 07/11/2021 09:01   CT ABDOMEN PELVIS W CONTRAST  Result Date: 07/11/2021 CLINICAL DATA:  Right lower quadrant pain. EXAM: CT ABDOMEN AND PELVIS WITH CONTRAST TECHNIQUE: Multidetector CT imaging of the abdomen and pelvis was performed using the standard protocol following bolus administration of intravenous contrast. RADIATION DOSE REDUCTION: This exam was performed according to the departmental dose-optimization program which includes automated exposure control, adjustment of the mA and/or kV according to patient size and/or use of iterative reconstruction technique. CONTRAST:  OMNIPAQUE IOHEXOL 300 MG/ML  SOLN COMPARISON:  None. FINDINGS: Lower chest: No acute abnormality. Hepatobiliary: No focal liver abnormality is seen. No gallstones, gallbladder wall thickening,  or biliary dilatation. Pancreas: Unremarkable. No pancreatic ductal dilatation or  surrounding inflammatory changes. Spleen: Normal in size without focal abnormality. Adrenals/Urinary Tract: Adrenal glands are unremarkable. Nonobstructing 6 mm right inferior pole renal calculus. Dystrophic calcification and scarring in the right mid kidney. Severe right hydronephrosis and moderate right hydroureter. Patient is status post reimplantation of the right ureter. Bladder is unremarkable. Stomach/Bowel: Small bowel dilatation measuring up to 4 cm with bowel wall thickening in the mid abdomen concerning for small bowel obstruction likely secondary to adhesions. With a relative transition point in the mid lower abdomen. No pneumoperitoneum, pneumatosis or portal venous gas. Diverticulosis without evidence of diverticulitis. Vascular/Lymphatic: Aortic atherosclerosis. No enlarged abdominal or pelvic lymph nodes. Reproductive: Uterus and bilateral adnexa are unremarkable. Other: Small amount of pelvic free fluid.  No abdominal hernia. Musculoskeletal: No acute osseous abnormality. No aggressive osseous lesion. Grade 1 anterolisthesis of L4 on L5 secondary to bilateral L4 pars interarticularis defects. 2 mm retrolisthesis of L5 on S1. IMPRESSION: 1. Small bowel dilatation measuring up to 4 cm with bowel wall thickening in the mid abdomen concerning for small bowel obstruction likely secondary to adhesions. With a relative transition point in the mid lower abdomen. 2. Diverticulosis without evidence of diverticulitis. 3. Aortic Atherosclerosis (ICD10-I70.0). 4. Nonobstructing 6 mm right inferior pole renal calculus. 5. Severe right hydronephrosis and moderate right hydroureter. Patient is status post reimplantation of the right ureter. Electronically Signed   By: Elige Ko M.D.   On: 07/11/2021 12:51   US Abdomen Limited  Result Date: 07/11/2021 CLINICAL DATA:  Right upper quadrant abdominal pain. EXAM: ULTRASOUND ABDOMEN LIMITED RIGHT UPPER QUADRANT COMPARISON:  CT of the abdomen and pelvis 07/11/2021  FINDINGS: Gallbladder: No gallstones or wall thickening visualized. No sonographic Murphy sign noted by sonographer. Common bile duct: Diameter: 2 mm Liver: Diffuse fatty infiltration liver is present. No discrete lesions are present. Portal vein is patent on color Doppler imaging with normal direction of blood flow towards the liver. Other: Right hydronephrosis noted. IMPRESSION: 1. No acute abnormality. 2. Hepatic steatosis. 3. Right hydronephrosis. Electronically Signed   By: Marin Roberts M.D.   On: 07/11/2021 12:53      Assessment/Plan SBO, likely adhesive - CT w/ CT scan showing small bowel dilatation measuring up to 4 cm concerning for small bowel obstruction likely secondary to adhesions with relative transition point in the mid lower abdomen - No current indication for emergency surgery - continue NGT for decompression and keep NPO - Start SBO protocol this am - Keep K > 4 and Mg > 2 for bowel function - Mobilize for bowel function - Hopefully patient will improve with conservative management. If patient fails to improve with conservative management, he may require exploratory surgery during admission - Agree with medical admission. We will follow with you.    FEN: NPO, NGT LIWS ID: none VTE: SCDs. Okay for chemical prophylaxis from surgical standpoint   I reviewed ED provider notes, hospitalist notes, last 24 h vitals and pain scores, last 48 h intake and output, last 24 h labs and trends, and last 24 h imaging results.  This care required high  level of medical decision making.   Eric Form, [facility] 07/11/2021, 1:33 PM Please see Amion for pager number during day hours 7:00am-4:30pm

## 2021-07-11 NOTE — Assessment & Plan Note (Addendum)
Discussed with URology this is common in the setting of reimplanted ureter and does not indicate a problem

## 2021-07-11 NOTE — ED Notes (Signed)
US at bedside

## 2021-07-11 NOTE — ED Notes (Signed)
Pt ambulatory with steady gait, standby assist to restroom. 

## 2021-07-11 NOTE — H&P (Signed)
Heather AveMary Pricer ZOX:[id number] DOB: 02/12/1956 DOA: 07/11/2021     PCP: Mliss SaxKremer, William Alfred, MD   Outpatient Specialists:  CARDS:   Dr. Bing MatterKrasowski    Patient arrived to ER on 07/11/21 at 0753 Referred by Attending Lewie ChamberGirguis, David, MD   Patient coming from:    home Lives   With family    Chief Complaint:   Chief Complaint  Patient presents with   Abdominal Pain    HPI: Heather Villegas is a 66 y.o. female with medical history significant of vertigo and Mnire's disease, hyperlipidemia,     Presented with   nausea abdominal fullness Heartburn and indigestion for the past 3 days.  Upper upper abdominal pain 2 days ago some nausea no vomiting initially no fever Reports pressure-like sensation in her upper abdomen first became intermittently became constant.  Passing gas and still had normal bowel movements no last 1 was today.  No blood in stool no urinary complaints.  No fevers no chills no chest pain or shortness of breath  She have had Ureter implantation She have had problem with stents in the past Reports her Ureter was too small and was blocked so she had to have reconnected at the age of 66  2 attempted hysterectomy failed due to adhesions  and bowel resection   Initial COVID TEST  NEGATIVE   Lab Results  Component Value Date   SARSCOV2NAA NEGATIVE 07/11/2021     Regarding pertinent Chronic problems:     Hyperlipidemia -  on statins Lipitor Lipid Panel     Component Value Date/Time   CHOL 232 (H) 12/26/2020 0953   TRIG 112 12/26/2020 0953   HDL 66 12/26/2020 0953   CHOLHDL 3.5 12/26/2020 0953   CHOLHDL 4 12/04/2018 0957   VLDL 28.8 12/04/2018 0957   LDLCALC 146 (H) 12/26/2020 0953   LDLDIRECT 145.0 02/10/2019 0859   LABVLDL 20 12/26/2020 0953      obesity-   BMI Readings from Last 1 Encounters:  07/11/21 31.15 kg/m    While in ER:   Noted to have elevated LFTs And evidence of SBO NG tube was placed    CXR -  NON acute  CTabd/pelvis -SBO severe right  hydronephrosis and moderate right hydroureter status post reimplantation of the right ureter Right upper quadrant ultrasound nonacute but does show hepatic steatosis and right hydronephrosis Case was discussed with neurosurgery Following Medications were ordered in ER: Medications  0.9 %  sodium chloride infusion (has no administration in time range)  acetaminophen (TYLENOL) tablet 650 mg (has no administration in time range)    Or  acetaminophen (TYLENOL) suppository 650 mg (has no administration in time range)  HYDROcodone-acetaminophen (NORCO/VICODIN) 5-325 MG per tablet 1-2 tablet (has no administration in time range)  ondansetron (ZOFRAN) injection 4 mg (4 mg Intravenous Given 07/11/21 0835)  alum & mag hydroxide-simeth (MAALOX/MYLANTA) 200-200-20 MG/5ML suspension 30 mL (30 mLs Oral Given 07/11/21 1002)  HYDROmorphone (DILAUDID) injection 0.5 mg (0.5 mg Intravenous Given 07/11/21 1002)  sodium chloride 0.9 % bolus 1,000 mL (0 mLs Intravenous Stopped 07/11/21 1209)  iohexol (OMNIPAQUE) 300 MG/ML solution 100 mL (100 mLs Intravenous Contrast Given 07/11/21 1226)  lidocaine (XYLOCAINE) 2 % jelly 1 application (1 application Other Given 07/11/21 1510)    _______________________________________________________ ER Provider Called:  Eric FormMartha H Kabrich, PA-C Gen Surgery They Recommend admit to medicine   Will see in AM      ED Triage Vitals  Enc Vitals Group     BP 07/11/21  [id number] (!) 193/70     Pulse Rate 07/11/21 0821 72     Resp 07/11/21 0821 (!) 22     Temp 07/11/21 0821 98.2 F (36.8 C)     Temp src --      SpO2 07/11/21 0821 95 %     Weight 07/11/21 0822 193 lb (87.5 kg)     Height 07/11/21 0822 5\' 6"  (1.676 m)     Head Circumference --      Peak Flow --      Pain Score 07/11/21 0822 9     Pain Loc --      Pain Edu? --      Excl. in [place]? --   TMAX(24)@     _________________________________________ Significant initial  Findings: Abnormal Labs Reviewed  COMPREHENSIVE METABOLIC PANEL  - Abnormal; Notable for the following components:      Result Value   Glucose, Bld 117 (*)    AST 105 (*)    ALT 112 (*)    All other components within normal limits  CBC - Abnormal; Notable for the following components:   Hemoglobin 15.4 (*)    All other components within normal limits  URINALYSIS, ROUTINE W REFLEX MICROSCOPIC - Abnormal; Notable for the following components:   Leukocytes,Ua TRACE (*)    All other components within normal limits  URINALYSIS, MICROSCOPIC (REFLEX) - Abnormal; Notable for the following components:   Bacteria, UA RARE (*)    All other components within normal limits    _________________________ Troponin 3 ECG: Ordered Personally reviewed by me showing: HR : 69 Rhythm:  1st av block   no evidence of ischemic changes QTC 437     The recent clinical data is shown below. Vitals:   07/11/21 1415 07/11/21 1423 07/11/21 1530 07/11/21 1600  BP:  (!) 179/71 (!) 148/60 (!) 137/57  Pulse: (!) 51 70 (!) 58 (!) 58  Resp: 18 14 20 17   Temp:      SpO2: 96% 96% 95% 93%  Weight:      Height:         WBC     Component Value Date/Time   WBC 7.2 07/11/2021 0829      UA   no evidence of UTI      Urine analysis:    Component Value Date/Time   COLORURINE YELLOW 07/11/2021 0910   APPEARANCEUR CLEAR 07/11/2021 0910   LABSPEC 1.020 07/11/2021 0910   PHURINE 6.5 07/11/2021 0910   GLUCOSEU NEGATIVE 07/11/2021 0910   GLUCOSEU NEGATIVE 12/04/2018 0957   HGBUR NEGATIVE 07/11/2021 0910   BILIRUBINUR NEGATIVE 07/11/2021 0910   KETONESUR NEGATIVE 07/11/2021 0910   PROTEINUR NEGATIVE 07/11/2021 0910   UROBILINOGEN 0.2 12/04/2018 0957   NITRITE NEGATIVE 07/11/2021 0910   LEUKOCYTESUR TRACE (A) 07/11/2021 0910    Results for orders placed or performed during the hospital encounter of 07/11/21  Resp Panel by RT-PCR (Flu A&B, Covid) Nasopharyngeal Swab     Status: None   Collection Time: 07/11/21  2:19 PM   Specimen: Nasopharyngeal Swab; Nasopharyngeal(NP)  swabs in vial transport medium  Result Value Ref Range Status   SARS Coronavirus 2 by RT PCR NEGATIVE NEGATIVE Final         Influenza A by PCR NEGATIVE NEGATIVE Final   Influenza B by PCR NEGATIVE NEGATIVE Final          _______________________________________________ Hospitalist was called for admission for SBO  The following Work up has been ordered so far:  Orders  Placed This Encounter  Procedures   Critical Care   Resp Panel by RT-PCR (Flu A&B, Covid) Nasopharyngeal Swab   DG Chest 2 View   US Abdomen Limited   CT ABDOMEN PELVIS W CONTRAST   DG Abdomen 1 View   Lipase, blood   Comprehensive metabolic panel   CBC   Urinalysis, Routine w reflex microscopic   Urinalysis, Microscopic (reflex)   HIV Antibody (routine testing w rflx)   Magnesium   Phosphorus   CBC WITH DIFFERENTIAL   TSH   Comprehensive metabolic panel   Diet NPO time specified   Saline Lock IV, Maintain IV access   Document Height and Actual Weight   Cardiac monitoring   Saline Lock IV, Maintain IV access   Insert NG/OG (Gastric Tube)   Patient has an active order for admit to inpatient/place in observation   Vital signs   Notify physician (specify)   Progressive Mobility Protocol: No Restrictions   Initiate Oral Care Protocol   Initiate Carrier Fluid Protocol   Assess   Check Pulse Oximetry while ambulating   Place order for blood cultures x 2 (from different sites) for Temp > 101F   RN may order General Admission PRN Orders utilizing "General Admission PRN medications" (through manage orders) for the following patient needs: allergy symptoms (Claritin), cold sores (Carmex), cough (Robitussin DM), eye irritation (Liquifilm Tears), hemorrhoids (Tucks), indigestion (Maalox), minor skin irritation (Hydrocortisone Cream), muscle pain Romeo Apple Gay), nose irritation (saline nasal spray) and sore throat (Chloraseptic spray).   SCDs   Cardiac Monitoring Continuous x 12 hours Indications for use: Other; other  indications for use: moniotr qtc   Full code   Consult to general surgery   Consult to urology   Consult to hospitalist   Airborne and Contact precautions   May transport without cardiac monitor   Pulse oximetry, continuous   Pulse oximetry check with vital signs   Oxygen therapy Mode or (Route): Nasal cannula; Liters Per Minute: 2; Keep 02 saturation: greater than 92 %   Incentive spirometry   Pulse oximetry, continuous   EKG 12-Lead   ED EKG   Admit to Inpatient (patient's expected length of stay will be greater than 2 midnights or inpatient only procedure)     OTHER Significant initial  Findings:  labs showing:    Recent Labs  Lab 07/11/21 0829 07/11/21 1944  NA 137 138  K 4.2 3.8  CO2 26 29  GLUCOSE 117* 96  BUN 14 13  CREATININE 0.61 0.62  CALCIUM 9.5 9.2  MG  --  2.3    Cr   stable,   Lab Results  Component Value Date   CREATININE 0.61 07/11/2021   CREATININE 0.64 02/20/2021   CREATININE 0.64 02/10/2019    Recent Labs  Lab 07/11/21 0829 07/11/21 1944  AST 105* 56*  ALT 112* 87*  ALKPHOS 103 97  BILITOT 0.7 0.6  PROT 7.9 7.0  ALBUMIN 4.2 3.9   Lab Results  Component Value Date   CALCIUM 9.2 07/11/2021       Plt: Lab Results  Component Value Date   PLT 265 07/11/2021       Recent Labs  Lab 07/11/21 0829  WBC 7.2  HGB 15.4*  HCT 45.1  MCV 90.2  PLT 265    HG/HCT  stable,       Component Value Date/Time   HGB 15.4 (H) 07/11/2021 0829   HCT 45.1 07/11/2021 0829   MCV 90.2 07/11/2021 0829  Recent Labs  Lab 07/11/21 0829  LIPASE 25   No results for input(s): AMMONIA in the last 168 hours.    Cardiac Panel (last 3 results) Recent Labs    07/11/21 1944  CKTOTAL 51        Cultures: No results found for: SDES, [place], CULT, REPTSTATUS   Radiological Exams on Admission: DG Chest 2 View  Result Date: 07/11/2021 CLINICAL DATA:  epigastric pain EXAM: CHEST - 2 VIEW COMPARISON:  None. FINDINGS: Low lung volumes. No  visible pleural effusions or pneumothorax. Cardiomediastinal silhouette is within normal limits. No evidence of acute osseous abnormality. IMPRESSION: Low lung volumes without evidence of acute cardiopulmonary disease. Electronically Signed   By: Margaretha Sheffield M.D.   On: 07/11/2021 09:01   DG Abdomen 1 View  Result Date: 07/11/2021 CLINICAL DATA:  NG tube placement EXAM: ABDOMEN - 1 VIEW COMPARISON:  Previous studies including the chest radiographs done on 07/11/2021 FINDINGS: There is interval placement of nasogastric tube. Distal portion of NG tube is coiled within the fundus of the stomach with its tip in the medial aspect of fundus of the stomach close to the gastroesophageal junction. Bowel gas pattern is nonspecific. There is poor inspiration. IMPRESSION: Distal portion of NG tube is noted coiled within the fundus of the stomach with its tip in the medial aspect of the fundus close to the gastroesophageal junction. Electronically Signed   By: Elmer Picker M.D.   On: 07/11/2021 15:42   CT ABDOMEN PELVIS W CONTRAST  Result Date: 07/11/2021 CLINICAL DATA:  Right lower quadrant pain. EXAM: CT ABDOMEN AND PELVIS WITH CONTRAST TECHNIQUE: Multidetector CT imaging of the abdomen and pelvis was performed using the standard protocol following bolus administration of intravenous contrast. RADIATION DOSE REDUCTION: This exam was performed according to the departmental dose-optimization program which includes automated exposure control, adjustment of the mA and/or kV according to patient size and/or use of iterative reconstruction technique. CONTRAST:  180mL OMNIPAQUE IOHEXOL 300 MG/ML  SOLN COMPARISON:  None. FINDINGS: Lower chest: No acute abnormality. Hepatobiliary: No focal liver abnormality is seen. No gallstones, gallbladder wall thickening, or biliary dilatation. Pancreas: Unremarkable. No pancreatic ductal dilatation or surrounding inflammatory changes. Spleen: Normal in size without focal  abnormality. Adrenals/Urinary Tract: Adrenal glands are unremarkable. Nonobstructing 6 mm right inferior pole renal calculus. Dystrophic calcification and scarring in the right mid kidney. Severe right hydronephrosis and moderate right hydroureter. Patient is status post reimplantation of the right ureter. Bladder is unremarkable. Stomach/Bowel: Small bowel dilatation measuring up to 4 cm with bowel wall thickening in the mid abdomen concerning for small bowel obstruction likely secondary to adhesions. With a relative transition point in the mid lower abdomen. No pneumoperitoneum, pneumatosis or portal venous gas. Diverticulosis without evidence of diverticulitis. Vascular/Lymphatic: Aortic atherosclerosis. No enlarged abdominal or pelvic lymph nodes. Reproductive: Uterus and bilateral adnexa are unremarkable. Other: Small amount of pelvic free fluid.  No abdominal hernia. Musculoskeletal: No acute osseous abnormality. No aggressive osseous lesion. Grade 1 anterolisthesis of L4 on L5 secondary to bilateral L4 pars interarticularis defects. 2 mm retrolisthesis of L5 on S1. IMPRESSION: 1. Small bowel dilatation measuring up to 4 cm with bowel wall thickening in the mid abdomen concerning for small bowel obstruction likely secondary to adhesions. With a relative transition point in the mid lower abdomen. 2. Diverticulosis without evidence of diverticulitis. 3. Aortic Atherosclerosis (ICD10-I70.0). 4. Nonobstructing 6 mm right inferior pole renal calculus. 5. Severe right hydronephrosis and moderate right hydroureter. Patient is status post reimplantation  of the right ureter. Electronically Signed   By: Kathreen Devoid M.D.   On: 07/11/2021 12:51   US Abdomen Limited  Result Date: 07/11/2021 CLINICAL DATA:  Right upper quadrant abdominal pain. EXAM: ULTRASOUND ABDOMEN LIMITED RIGHT UPPER QUADRANT COMPARISON:  CT of the abdomen and pelvis 07/11/2021 FINDINGS: Gallbladder: No gallstones or wall thickening visualized. No  sonographic Murphy sign noted by sonographer. Common bile duct: Diameter: 2 mm Liver: Diffuse fatty infiltration liver is present. No discrete lesions are present. Portal vein is patent on color Doppler imaging with normal direction of blood flow towards the liver. Other: Right hydronephrosis noted. IMPRESSION: 1. No acute abnormality. 2. Hepatic steatosis. 3. Right hydronephrosis. Electronically Signed   By: San Morelle M.D.   On: 07/11/2021 12:53   _______________________________________________________________________________________________________ Latest   Blood pressure (!) 137/57, pulse (!) 58, temperature 98.2 F (36.8 C), resp. rate 17, height 5\' 6"  (1.676 m), weight 87.5 kg, SpO2 93 %.   Vitals  labs and radiology finding personally reviewed  Review of Systems:    Pertinent positives include:   abdominal pain, nausea,   Constitutional:  No weight loss, night sweats, Fevers, chills, fatigue, weight loss  HEENT:  No headaches, Difficulty swallowing,Tooth/dental problems,Sore throat,  No sneezing, itching, ear ache, nasal congestion, post nasal drip,  Cardio-vascular:  No chest pain, Orthopnea, PND, anasarca, dizziness, palpitations.no Bilateral lower extremity swelling  GI:  No heartburn, indigestion,vomiting, diarrhea, change in bowel habits, loss of appetite, melena, blood in stool, hematemesis Resp:  no shortness of breath at rest. No dyspnea on exertion, No excess mucus, no productive cough, No non-productive cough, No coughing up of blood.No change in color of mucus.No wheezing. Skin:  no rash or lesions. No jaundice GU:  no dysuria, change in color of urine, no urgency or frequency. No straining to urinate.  No flank pain.  Musculoskeletal:  No joint pain or no joint swelling. No decreased range of motion. No back pain.  Psych:  No change in mood or affect. No depression or anxiety. No memory loss.  Neuro: no localizing neurological complaints, no tingling, no  weakness, no double vision, no gait abnormality, no slurred speech, no confusion  All systems reviewed and apart from [place] all are negative _______________________________________________________________________________________________ Past Medical History:   Past Medical History:  Diagnosis Date   Deafness in right ear    Heart murmur    Hyperlipidemia    diet  controlled   Meniere disease    Post-operative nausea and vomiting    Vertigo    To ED Friday night 9-10- was given Meclazine - improved       Past Surgical History:  Procedure Laterality Date   ABDOMINAL HYSTERECTOMY     2 attempts/ unable to remove uterus/ had a lot bleeding until ablation was done   ABLATION     bowel reconstruction  2003   had adhesions/ removed part of small intestine   BREAST BIOPSY Left 2013   left/ precancerous lesion   CATARACT EXTRACTION     left eye   KIDNEY STONE SURGERY  2018   lithotripsy   TONSILLECTOMY     TUBAL LIGATION     ureter reconnection     URETERAL REIMPLANTION      Social History:  Ambulatory   independently      reports that she quit smoking about 36 years ago. Her smoking use included cigarettes. She has never used smokeless tobacco. She reports that she does not currently use alcohol. She reports  that she does not use drugs.   Family History:   Family History  Problem Relation Age of Onset   Lung cancer Mother    Lung cancer Father    Thrombosis Father    Drug abuse Brother    Colon cancer Neg Hx    Colon polyps Neg Hx    Esophageal cancer Neg Hx    Rectal cancer Neg Hx    Stomach cancer Neg Hx    ______________________________________________________________________________________________ Allergies: Allergies  Allergen Reactions   Tape     Surgical/breaks down skin     Prior to Admission medications   Medication Sig Start Date End Date Taking? Authorizing Provider  Ascorbic Acid (VITAMIN C PO) Take 2 capsules by mouth daily. Take 2 capsules  daily   Yes [provider]  atorvastatin (LIPITOR) 10 MG tablet Take 10 mg by mouth daily.   Yes [provider]  b complex vitamins tablet Take 1 tablet by mouth daily.   Yes [provider]  Coenzyme Q10 (CO Q 10 PO) Take 1 capsule by mouth daily.   Yes [provider]  diazepam (VALIUM) 2 MG tablet Take 1 tablet (2 mg total) by mouth every 12 (twelve) hours as needed (vertigo). 06/03/20  Yes Nche, Charlene Brooke, NP  Multiple Vitamin (MULTIVITAMIN) capsule Take 1 capsule by mouth daily.   Yes [provider]  Omega-3 Fatty Acids (FISH OIL) 1000 MG CAPS Take 1 capsule by mouth daily.   Yes [provider]  ondansetron (ZOFRAN ODT) 4 MG disintegrating tablet Take 1 tablet (4 mg total) by mouth every 8 (eight) hours as needed. Patient taking differently: Take 4 mg by mouth every 8 (eight) hours as needed for nausea or vomiting. 12/22/19  Yes Nche, Charlene Brooke, NP  VITAMIN D PO Take 2 tablets by mouth daily.   Yes [provider]  atorvastatin (LIPITOR) 10 MG tablet Take 1 tablet (10 mg total) by mouth daily for 15 days. 12/26/20 01/10/21  Park Liter, MD    ___________________________________________________________________________________________________ Physical Exam: Vitals with BMI 07/11/2021 07/11/2021 07/11/2021  Height - - -  Weight - - -  BMI - - -  Systolic 0000000 [id number]  Diastolic 57 60 71  Pulse 58 58 70     1. General:  in No  Acute distress    Chronically ill   -appearing 2. Psychological: Alert and   Oriented 3. Head/ENT:   Dry Mucous Membranes                          Head Non traumatic, neck supple                         Poor Dentition 4. SKIN:  decreased Skin turgor,  Skin clean Dry and intact no rash 5. Heart: Regular rate and rhythm  Murmur, no Rub or gallop 6. Lungs:  Clear to auscultation bilaterally, no wheezes or crackles   7. Abdomen: Soft,  non-tender, Non distended   obese  diminished bowel  sounds   8. Lower extremities: no clubbing, cyanosis, no  edema 9. Neurologically Grossly intact, moving all 4 extremities equally   10. MSK: Normal range of motion    Chart has been reviewed  ______________________________________________________________________________________________  Assessment/Plan 66 y.o. female with medical history significant of vertigo and Mnire's disease, hyperlipidemia  Admitted for  SBO  Present on Admission:  SBO (small bowel obstruction) ([place])  Dyslipidemia  Elevated LFTs  Hydronephrosis     SBO (small bowel obstruction) ([facility])  Likely cause  Adhesions,  - admit for conservative management  - NG tube - NPO - KUB in AM - appreciate General surgery consult.   Dyslipidemia Hold lipitor for now given elevated LFt  Elevated LFTs Unclear etiology  Check hepatitis panel  likely in the setting of mild dehydration Check CK Lipid panel givn haptic statosis  Hydronephrosis Discussed with URology this is common in the setting of reimplanted ureter and does not indicate a problem    Other plan as per orders.  DVT prophylaxis:      SCDs Start: 07/11/21 1850    Code Status:    Code Status: Full Code FULL CODE  as per patient   I had personally discussed CODE STATUS with patient      Family Communication:   Family not at  Bedside    Disposition Plan:       To home once workup is complete and patient is stable   Following barriers for discharge:                                                        Pain controlled with PO medications                                                             Will need to be able to tolerate PO                                                       Will need consultants to evaluate patient prior to discharge       Consults called: general surgery is aware, call urology   Admission status:  ED Disposition     ED Disposition  Admit   Condition  --   Comment  Hospital Area: [facility] [place] [[id number]]  Level of Care: Telemetry [5]  Admit to tele based on following criteria: Complex arrhythmia (Bradycardia/Tachycardia)  May admit patient to [facility] or [facility] if equivalent level of care is available:: No  Interfacility transfer: Yes  Covid Evaluation: Asymptomatic Screening Protocol (No Symptoms)  Diagnosis: SBO (small bowel obstruction) [facility]) [[id number]]  Admitting Physician: Lewie Chamber Mellisa.Dayhoff  Attending Physician: Frederick Peers, DAVID Mellisa.Dayhoff  Estimated length of stay: past midnight tomorrow  Certification:: I certify this patient will need inpatient services for at least 2 midnights            inpatient     I Expect 2 midnight stay secondary to severity of patient's current illness need for inpatient interventions justified by the following:     Severe lab/radiological/exam abnormalities including:    SBO    That are currently affecting medical management.   I expect  patient to be hospitalized for 2 midnights requiring inpatient medical care.  Patient is at high risk for adverse outcome (such as loss of life or disability)  if not treated.  Indication for inpatient stay as follows:    Need for operative/procedural  intervention    Need for IV fluids, IV pain medications,      Level of care     tele  For 12H     Lab Results  Component Value Date   [place] 07/11/2021     Precautions: admitted as   Covid Negative      Naszir Cott 07/11/2021, 11:29 PM    [facility]     after 2 AM please page floor coverage PA If 7AM-7PM, please contact the day team taking care of the patient using [email or web address]   Patient was evaluated in the context of the global COVID-19 pandemic, which necessitated consideration that the patient might be at risk for infection with the SARS-CoV-2 virus that causes COVID-19. Institutional protocols and algorithms that pertain to the evaluation of patients at risk for COVID-19 are in a state of  rapid change based on information released by regulatory bodies including the CDC and federal and state organizations. These policies and algorithms were followed during the patient's care.

## 2021-07-11 NOTE — ED Notes (Signed)
Pt ambulated to [facility]

## 2021-07-11 NOTE — Plan of Care (Signed)
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 CT abdomen/pelvis was obtained and showed 4 cm small bowel dilation with bowel wall thickening in the mid abdomen concerning for SBO due to adhesions.  Transition point was noted in the mid lower abdomen. Also seen was her underlying known right hydroureteronephrosis.  Case was also discussed with urology, reassurance provided given her remote history and that as patient continues to void easily, this is not an issue requiring further work-up or intervention.  Case was also discussed with general surgery who recommended placement of NG tube due to ongoing nausea and transfer for further work-up and monitoring.  Plan of care: The patient is accepted for admission to Telemetry unit, at [facility].  Author: Lewie Chamber, MD 07/11/2021  Check [email or web address] for on-call coverage.  Nursing staff, Please call [facility] Admits & Consults System-Wide number on Amion as soon as patient's arrival, so appropriate admitting provider can evaluate the pt.

## 2021-07-11 NOTE — ED Triage Notes (Signed)
Heartburn and indigestion since Saturday.  Upper abdominal pain since Sunday.  Some nausea, no vomiting, no diarrhea.  No fever.  Pt has had H-pylori in the past.

## 2021-07-11 NOTE — ED Provider Notes (Signed)
[facility] EMERGENCY DEPARTMENT Provider Note   CSN: [id number] Arrival date & time: 07/11/21  0753     History  Chief Complaint  Patient presents with   Abdominal Pain    Heather Villegas is a 66 y.o. female.  HPI  Patient with medical history including heart murmur, vertigo, Mnire's disease hysterectomy, ureter replantation presents  with complaints of upper abdominal pain.  Patient's pain started on Saturday,  states that she has a pressure-like sensation in her upper abdomen, states it was originally intermittent but now has become more constant this morning, she has associated nausea without vomiting, states that she still passing gas and having normal bowel movements, last bowel movement was today, she denies any melena or hematochezia, she denies any urinary symptoms, no nausea no vomiting, states pain is positional improved while laying down worsen while she is up, p.o. intake does not increase or decrease the pain, she denies any significant alcohol use or NSAID use, no history of stomach ulcers, she has no associated fevers or chills no URI-like symptoms.  She has had no associated chest pain, she has no cardiac history, denies pain worsen with exertion, no pleuritic chest pain, no orthopnea or worsening peripheral edema.  Reviewed patient's chart patient has significant surgical history including bowel reconstruction 2003 after failed attempt to remove uterus, urethral reimplant meant, kidney stones, total hysterectomy.  Home Medications Prior to Admission medications   Medication Sig Start Date End Date Taking? Authorizing Provider  Ascorbic Acid (VITAMIN C PO) Take 500 mg by mouth daily. Take 2 capsules daily    [provider]  atorvastatin (LIPITOR) 10 MG tablet Take 1 tablet (10 mg total) by mouth daily for 15 days. 12/26/20 01/10/21  Park Liter, MD  b complex vitamins tablet Take 1 tablet by mouth daily. Unknown strenght    [provider]   Coenzyme Q10 (CO Q 10 PO) Take 1 capsule by mouth daily. Unknown strenght    [provider]  diazepam (VALIUM) 2 MG tablet Take 1 tablet (2 mg total) by mouth every 12 (twelve) hours as needed (vertigo). 06/03/20   Nche, Charlene Brooke, NP  Multiple Vitamin (MULTIVITAMIN) capsule Take 1 capsule by mouth daily. Unknown strenght    [provider]  ondansetron (ZOFRAN ODT) 4 MG disintegrating tablet Take 1 tablet (4 mg total) by mouth every 8 (eight) hours as needed. Patient taking differently: Take 4 mg by mouth every 8 (eight) hours as needed for nausea or vomiting. 12/22/19   Nche, Charlene Brooke, NP      Allergies    Tape    Review of Systems   Review of Systems  Constitutional:  Negative for chills and fever.  Respiratory:  Negative for shortness of breath.   Cardiovascular:  Negative for chest pain.  Gastrointestinal:  Positive for abdominal pain and nausea. Negative for constipation, diarrhea and vomiting.  Genitourinary:  Negative for enuresis and frequency.  Neurological:  Negative for headaches.   Physical Exam Updated Vital Signs BP (!) 179/71    Pulse 70    Temp 98.2 F (36.8 C)    Resp 14    Ht 5\' 6"  (1.676 m)    Wt 87.5 kg    SpO2 96%    BMI 31.15 kg/m  Physical Exam Vitals and nursing note reviewed.  Constitutional:      General: She is not in acute distress.    Appearance: She is not ill-appearing.  HENT:     Head:  Normocephalic and atraumatic.     Nose: No congestion.  Eyes:     Conjunctiva/sclera: Conjunctivae normal.  Cardiovascular:     Rate and Rhythm: Normal rate and regular rhythm.     Pulses: Normal pulses.     Heart sounds: No murmur heard.   No friction rub. No gallop.  Pulmonary:     Effort: No respiratory distress.     Breath sounds: No wheezing, rhonchi or rales.  Abdominal:     General: There is no distension.     Palpations: Abdomen is soft.     Tenderness: There is abdominal tenderness. There is no right CVA tenderness or left  CVA tenderness.     Comments: Abdomen nondistended, normal bowel sounds, dull to percussion, she had noted epigastric tenderness as well as right upper quadrant tenderness, there is no guarding, rebound tenderness, peritoneal sign, negative Murphy sign McBurney point, she had no CVA tenderness.  Musculoskeletal:     Right lower leg: No edema.     Left lower leg: No edema.  Skin:    General: Skin is warm and dry.  Neurological:     Mental Status: She is alert.  Psychiatric:        Mood and Affect: Mood normal.    ED Results / Procedures / Treatments   Labs (all labs ordered are listed, but only abnormal results are displayed) Labs Reviewed  COMPREHENSIVE METABOLIC PANEL - Abnormal; Notable for the following components:      Result Value   Glucose, Bld 117 (*)    AST 105 (*)    ALT 112 (*)    All other components within normal limits  CBC - Abnormal; Notable for the following components:   Hemoglobin 15.4 (*)    All other components within normal limits  URINALYSIS, ROUTINE W REFLEX MICROSCOPIC - Abnormal; Notable for the following components:   Leukocytes,Ua TRACE (*)    All other components within normal limits  URINALYSIS, MICROSCOPIC (REFLEX) - Abnormal; Notable for the following components:   Bacteria, UA RARE (*)    All other components within normal limits  RESP PANEL BY RT-PCR (FLU A&B, COVID) ARPGX2  LIPASE, BLOOD  TROPONIN I (HIGH SENSITIVITY)  TROPONIN I (HIGH SENSITIVITY)    EKG EKG Interpretation  Date/Time:  Tuesday July 11 2021 08:17:22 EST Ventricular Rate:  69 PR Interval:  156 QRS Duration: 84 QT Interval:  408 QTC Calculation: 437 R Axis:   72 Text Interpretation: Normal sinus rhythm Normal ECG No previous ECGs available Confirmed by Vanetta Mulders [phone]) on 07/11/2021 9:04:58 AM  Radiology DG Chest 2 View  Result Date: 07/11/2021 CLINICAL DATA:  epigastric pain EXAM: CHEST - 2 VIEW COMPARISON:  None. FINDINGS: Low lung volumes. No visible  pleural effusions or pneumothorax. Cardiomediastinal silhouette is within normal limits. No evidence of acute osseous abnormality. IMPRESSION: Low lung volumes without evidence of acute cardiopulmonary disease. Electronically Signed   By: Feliberto Harts M.D.   On: 07/11/2021 09:01   CT ABDOMEN PELVIS W CONTRAST  Result Date: 07/11/2021 CLINICAL DATA:  Right lower quadrant pain. EXAM: CT ABDOMEN AND PELVIS WITH CONTRAST TECHNIQUE: Multidetector CT imaging of the abdomen and pelvis was performed using the standard protocol following bolus administration of intravenous contrast. RADIATION DOSE REDUCTION: This exam was performed according to the departmental dose-optimization program which includes automated exposure control, adjustment of the mA and/or kV according to patient size and/or use of iterative reconstruction technique. CONTRAST:  OMNIPAQUE IOHEXOL 300 MG/ML  SOLN  COMPARISON:  None. FINDINGS: Lower chest: No acute abnormality. Hepatobiliary: No focal liver abnormality is seen. No gallstones, gallbladder wall thickening, or biliary dilatation. Pancreas: Unremarkable. No pancreatic ductal dilatation or surrounding inflammatory changes. Spleen: Normal in size without focal abnormality. Adrenals/Urinary Tract: Adrenal glands are unremarkable. Nonobstructing 6 mm right inferior pole renal calculus. Dystrophic calcification and scarring in the right mid kidney. Severe right hydronephrosis and moderate right hydroureter. Patient is status post reimplantation of the right ureter. Bladder is unremarkable. Stomach/Bowel: Small bowel dilatation measuring up to 4 cm with bowel wall thickening in the mid abdomen concerning for small bowel obstruction likely secondary to adhesions. With a relative transition point in the mid lower abdomen. No pneumoperitoneum, pneumatosis or portal venous gas. Diverticulosis without evidence of diverticulitis. Vascular/Lymphatic: Aortic atherosclerosis. No enlarged abdominal  or pelvic lymph nodes. Reproductive: Uterus and bilateral adnexa are unremarkable. Other: Small amount of pelvic free fluid.  No abdominal hernia. Musculoskeletal: No acute osseous abnormality. No aggressive osseous lesion. Grade 1 anterolisthesis of L4 on L5 secondary to bilateral L4 pars interarticularis defects. 2 mm retrolisthesis of L5 on S1. IMPRESSION: 1. Small bowel dilatation measuring up to 4 cm with bowel wall thickening in the mid abdomen concerning for small bowel obstruction likely secondary to adhesions. With a relative transition point in the mid lower abdomen. 2. Diverticulosis without evidence of diverticulitis. 3. Aortic Atherosclerosis (ICD10-I70.0). 4. Nonobstructing 6 mm right inferior pole renal calculus. 5. Severe right hydronephrosis and moderate right hydroureter. Patient is status post reimplantation of the right ureter. Electronically Signed   By: Kathreen Devoid M.D.   On: 07/11/2021 12:51   US Abdomen Limited  Result Date: 07/11/2021 CLINICAL DATA:  Right upper quadrant abdominal pain. EXAM: ULTRASOUND ABDOMEN LIMITED RIGHT UPPER QUADRANT COMPARISON:  CT of the abdomen and pelvis 07/11/2021 FINDINGS: Gallbladder: No gallstones or wall thickening visualized. No sonographic Murphy sign noted by sonographer. Common bile duct: Diameter: 2 mm Liver: Diffuse fatty infiltration liver is present. No discrete lesions are present. Portal vein is patent on color Doppler imaging with normal direction of blood flow towards the liver. Other: Right hydronephrosis noted. IMPRESSION: 1. No acute abnormality. 2. Hepatic steatosis. 3. Right hydronephrosis. Electronically Signed   By: San Morelle M.D.   On: 07/11/2021 12:53    Procedures .Critical Care Performed by: Marcello Fennel, PA-C Authorized by: Marcello Fennel, PA-C   Critical care provider statement:    Critical care time (minutes):  30   Critical care was necessary to treat or prevent imminent or life-threatening  deterioration of the following conditions: Bowel obstruction as well as severe hydronephrosis.   Critical care was time spent personally by me on the following activities:  Discussions with consultants, evaluation of patient's response to treatment, examination of patient, ordering and review of laboratory studies, ordering and review of radiographic studies, ordering and performing treatments and interventions, pulse oximetry, re-evaluation of patient's condition and review of old charts   I assumed direction of critical care for this patient from another provider in my specialty: no     Care discussed with: admitting provider      Medications Ordered in ED Medications  fentaNYL (SUBLIMAZE) injection 50 mcg (50 mcg Intravenous Given 07/11/21 0835)  lidocaine (XYLOCAINE) 2 % jelly 1 application (has no administration in time range)  ondansetron (ZOFRAN) injection 4 mg (4 mg Intravenous Given 07/11/21 0835)  alum & mag hydroxide-simeth (MAALOX/MYLANTA) 200-200-20 MG/5ML suspension 30 mL (30 mLs Oral Given 07/11/21 1002)  HYDROmorphone (DILAUDID) injection  0.5 mg (0.5 mg Intravenous Given 07/11/21 1002)  sodium chloride 0.9 % bolus 1,000 mL (0 mLs Intravenous Stopped 07/11/21 1209)  iohexol (OMNIPAQUE) 300 MG/ML solution 100 mL (100 mLs Intravenous Contrast Given 07/11/21 1226)    ED Course/ Medical Decision Making/ A&P                           Medical Decision Making Amount and/or Complexity of Data Reviewed Labs: ordered. Radiology: ordered.  Risk OTC drugs. Prescription drug management. Decision regarding hospitalization.   This patient presents to the ED for concern of abdominal pain, this involves an extensive number of treatment options, and is a complaint that carries with it a high risk of complications and morbidity.  The differential diagnosis includes cholecystitis, choledocholithiasis, stomach ulcer, diverticulitis, bowel obstruction, ACS    Additional history  obtained:  Additional history obtained from electronic medical record External records from outside source obtained and reviewed including please see HPI for further detail   Co morbidities that complicate the patient evaluation  Multiple abdominal surgeries  Social Determinants of Health:  N/A    Lab Tests:  I Ordered, and personally interpreted labs.  The pertinent results include: CBC unremarkable, CMP shows hyperglycemia of 117, AST elevated 105 ALT 112, lipase is 25, UA unremarkable, first troponin is 3, second opponent is 4   Imaging Studies ordered:  I ordered imaging studies including chest x-ray, limited ultrasound ultrasound was negative for acute findings, does show fatty infiltrates.,  CT imaging reveals small bowel dilation up to 4 cm with bowel wall thickening concerning for small bowel obstruction likely due to adhesions.  She also notes severe right hydronephrosis and moderate right hydroureter. I independently visualized and interpreted imaging which showed chest x-ray unremarkable, limited ultrasound I agree with the radiologist interpretation   Cardiac Monitoring:  The patient was maintained on a cardiac monitor.  I personally viewed and interpreted the cardiac monitored which showed an underlying rhythm of: EKG sinus without signs of ischemia   Medicines ordered and prescription drug management:  I ordered medication including fluids, pain medication, antiemetics for pain control and nausea I have reviewed the patients home medicines and have made adjustments as needed  Critical Interventions:  Has evidence of bowel obstruction as well as severe hydronephrosis, will place NG tube, consult with urology as well as general surgery.   Reevaluation:  On arrival patient presents with stomach pain, right upper quadrant, she had a noted elevated liver enzymes, I am concerned for possible gallbladder abnormality, will obtain limited ultrasound.  Patient was  reassessed after pain medication fluids, she is found resting calmly, has no complaints, limited ultrasound was negative for acute findings, will obtain CT imaging for further evaluation.  CT imaging reveals likely bowel obstruction with hydronephrosis, will consult with both urology as well as general surgery for further recommendations.  Updated patient on recommendations from both urology as well as general surgery she agreed with this plan.  We will consult hospice team for admission.    Consultations Obtained:  I requested consultation with general surgery Dr. Windle Guard,  and discussed lab and imaging findings as well as pertinent plan - they recommend: Due to her multiple medical issues recommend hospital admission, placed NG tube and they will consult on the patient. Spoke Dr. Tammi Klippel of urology he reviewed patient's case likely this is reflux from a full bladder, once bladder is drained the hydronephrosis should resolve on its own.  Rule out Low suspicion for systemic infection patient nontoxic-appearing, vital signs reassuring, she does not meet sepsis or SIRS criteria.  I have low suspicion for cholecystitis, cholangitis, choledocholithiasis as CT imaging as well as ultrasound is negative for these findings.  She does have elevated liver enzymes but note that she has fatty infiltrate seen on ultrasound likely secondary to this.  I have low suspicion for dissection as pain is not radiating to her back, is not a stabbing or tearing like sensation, presentation is more consistent with likely bowel obstruction.  I have low suspicion for pyelo-, kidney stone, UTI she has no urinary symptoms, UA is negative for signs of infection, no AKI present.    Dispostion and problem list  After consideration of the diagnostic results and the patients response to treatment, I feel that the patent would benefit from admission.  Bowel obstruction-likely secondary due to adhesions, NG tube has been  placed, will need further evaluation. Hydronephrosis-likely secondary due to full bladder, should resolve on its own.  Will need further observation. Spoke with Dr. Sabino Gasser who will admit the patient.            Final Clinical Impression(s) / ED Diagnoses Final diagnoses:  Small bowel obstruction [facility])  Other hydronephrosis    Rx / DC Orders ED Discharge Orders     None         Marcello Fennel, PA-C 07/11/21 1505    Fredia Sorrow, MD 07/20/21 [phone]

## 2021-07-11 NOTE — Assessment & Plan Note (Signed)
Hold lipitor for now given elevated LFt

## 2021-07-11 NOTE — Assessment & Plan Note (Signed)
Unclear etiology  Check hepatitis panel  likely in the setting of mild dehydration Check CK Lipid panel givn haptic statosis

## 2021-07-11 NOTE — Subjective & Objective (Signed)
Heartburn and indigestion for the past 3 days.  Upper upper abdominal pain 2 days ago some nausea no vomiting initially no fever Reports pressure-like sensation in her upper abdomen first became intermittently became constant.  Passing gas and still had normal bowel movements no last 1 was today.  No blood in stool no urinary complaints.  No fevers no chills no chest pain or shortness of breath

## 2021-07-11 NOTE — Plan of Care (Signed)

## 2021-07-11 NOTE — Assessment & Plan Note (Signed)
Likely cause  Adhesions,  - admit for conservative management  - NG tube - NPO - KUB in AM - appreciate General surgery consult.

## 2021-07-12 ENCOUNTER — Inpatient Hospital Stay (HOSPITAL_COMMUNITY): Payer: Federal, State, Local not specified - PPO

## 2021-07-12 DIAGNOSIS — K5669 Other partial intestinal obstruction: Secondary | ICD-10-CM | POA: Diagnosis not present

## 2021-07-12 DIAGNOSIS — Z9889 Other specified postprocedural states: Secondary | ICD-10-CM | POA: Diagnosis not present

## 2021-07-12 DIAGNOSIS — K6389 Other specified diseases of intestine: Secondary | ICD-10-CM | POA: Diagnosis not present

## 2021-07-12 DIAGNOSIS — N200 Calculus of kidney: Secondary | ICD-10-CM | POA: Diagnosis not present

## 2021-07-12 DIAGNOSIS — K56609 Unspecified intestinal obstruction, unspecified as to partial versus complete obstruction: Secondary | ICD-10-CM | POA: Diagnosis not present

## 2021-07-12 LAB — COMPREHENSIVE METABOLIC PANEL
ALT: 71 U/L — ABNORMAL HIGH (ref 0–44)
AST: 43 U/L — ABNORMAL HIGH (ref 15–41)
Albumin: 3.5 g/dL (ref 3.5–5.0)
Alkaline Phosphatase: 83 U/L (ref 38–126)
Anion gap: 4 — ABNORMAL LOW (ref 5–15)
BUN: 15 mg/dL (ref 8–23)
CO2: 26 mmol/L (ref 22–32)
Calcium: 8.5 mg/dL — ABNORMAL LOW (ref 8.9–10.3)
Chloride: 108 mmol/L (ref 98–111)
Creatinine, Ser: 0.61 mg/dL (ref 0.44–1.00)
GFR, Estimated: >60 mL/min (ref >60)
Glucose, Bld: 91 mg/dL (ref 70–99)
Potassium: 3.8 mmol/L (ref 3.5–5.1)
Sodium: 138 mmol/L (ref 135–145)
Total Bilirubin: 0.4 mg/dL (ref 0.3–1.2)
Total Protein: 6.4 g/dL — ABNORMAL LOW (ref 6.5–8.1)

## 2021-07-12 LAB — LIPID PANEL
Cholesterol: 165 mg/dL (ref 0–200)
HDL: 45 mg/dL (ref >40)
LDL Cholesterol: 98 mg/dL (ref 0–99)
Total CHOL/HDL Ratio: 3.7 RATIO
Triglycerides: 111 mg/dL (ref <150)
VLDL: 22 mg/dL (ref 0–40)

## 2021-07-12 LAB — CBC WITH DIFFERENTIAL/PLATELET
Abs Immature Granulocytes: 0.02 10*3/uL (ref 0.00–0.07)
Basophils Absolute: 0 10*3/uL (ref 0.0–0.1)
Basophils Relative: 0 %
Eosinophils Absolute: 0.1 10*3/uL (ref 0.0–0.5)
Eosinophils Relative: 1 %
HCT: 39.3 % (ref 36.0–46.0)
Hemoglobin: 13.1 g/dL (ref 12.0–15.0)
Immature Granulocytes: 0 %
Lymphocytes Relative: 21 %
Lymphs Abs: 1.2 10*3/uL (ref 0.7–4.0)
MCH: 31 pg (ref 26.0–34.0)
MCHC: 33.3 g/dL (ref 30.0–36.0)
MCV: 92.9 fL (ref 80.0–100.0)
Monocytes Absolute: 0.5 10*3/uL (ref 0.1–1.0)
Monocytes Relative: 8 %
Neutro Abs: 3.9 10*3/uL (ref 1.7–7.7)
Neutrophils Relative %: 70 %
Platelets: 226 10*3/uL (ref 150–400)
RBC: 4.23 MIL/uL (ref 3.87–5.11)
RDW: 13.4 % (ref 11.5–15.5)
WBC: 5.7 10*3/uL (ref 4.0–10.5)
nRBC: 0 % (ref 0.0–0.2)

## 2021-07-12 LAB — HEPATITIS PANEL, ACUTE
HCV Ab: NONREACTIVE
Hep A IgM: NONREACTIVE
Hep B C IgM: NONREACTIVE
Hepatitis B Surface Ag: NONREACTIVE

## 2021-07-12 LAB — MAGNESIUM: Magnesium: 2.1 mg/dL (ref 1.7–2.4)

## 2021-07-12 LAB — HIV ANTIBODY (ROUTINE TESTING W REFLEX): HIV Screen 4th Generation wRfx: NONREACTIVE

## 2021-07-12 LAB — TSH: TSH: 3.114 u[IU]/mL (ref 0.350–4.500)

## 2021-07-12 LAB — PHOSPHORUS: Phosphorus: 3.4 mg/dL (ref 2.5–4.6)

## 2021-07-12 MED ORDER — ONDANSETRON HCL 4 MG/2ML IJ SOLN
4.0000 mg | Freq: Four times a day (QID) | INTRAMUSCULAR | Status: DC | PRN
  Filled 2021-07-12: qty 2

## 2021-07-12 MED ORDER — PHENOL 1.4 % MT LIQD
1.0000 | OROMUCOSAL | Status: DC | PRN
  Filled 2021-07-12: qty 177

## 2021-07-12 MED ORDER — DIATRIZOATE MEGLUMINE & SODIUM 66-10 % PO SOLN
90.0000 mL | Freq: Once | ORAL | Status: AC
  Administered 2021-07-12: 90 mL via NASOGASTRIC
  Filled 2021-07-12: qty 90

## 2021-07-12 NOTE — Clinical Social Work Note (Signed)
°  Transition of Care [facility]) Screening Note   Patient Details  Name: Heather Villegas Date of Birth: 1955-08-27   Transition of Care [facility]) CM/SW Contact:    Ida Rogue, LCSW Phone Number: 07/12/2021, 9:18 AM    Transition of Care Department [facility]) has reviewed patient and no TOC needs have been identified at this time. We will continue to monitor patient advancement through interdisciplinary progression rounds. If new patient transition needs arise, please place a TOC consult.

## 2021-07-12 NOTE — Progress Notes (Signed)
PROGRESS NOTE  Tonda Wiederhold KGY:[id number] DOB: 02-12-1956 DOA: 07/11/2021 PCP: Mliss Sax, MD  Brief History   Tanisa Lagace is a 66 y.o. female with medical history significant of vertigo and Mnire's disease, hyperlipidemia,      Presented with   nausea abdominal fullness Heartburn and indigestion for the past 3 days.  Upper upper abdominal pain 2 days ago some nausea no vomiting initially no fever Reports pressure-like sensation in her upper abdomen first became intermittently became constant.  Passing gas and still had normal bowel movements no last 1 was today.  No blood in stool no urinary complaints.  No fevers no chills no chest pain or shortness of breath  She have had Ureter implantation She have had problem with stents in the past Reports her Ureter was too small and was blocked so she had to have reconnected at the age of 69  2 attempted hysterectomy failed due to adhesions  and bowel resection  NGT was placed and has been clamped at the time of my visit. The patient states that she is feeling better, but abdomen remains quite distended.  General surgery has evaluated the patient. Due to the patient's history of severe adhesions and history of small bowel resection, they intend to slowly decompress the patient's abdomen.   Consultants  General surgery.  Procedures  NGT placement.  Antibiotics   Anti-infectives (From admission, onward)    None      Subjective  The patient is resting in bed. NGT is clamped. No new complaints.  Objective   Vitals:  Vitals:   07/12/21 0618 07/12/21 1404  BP: (!) 136/55 (!) 154/55  Pulse: 60 (!) 58  Resp: 16 18  Temp: 97.8 F (36.6 C) (!) 97.5 F (36.4 C)  SpO2: 94% 96%   Exam:  Constitutional:  The patient is awake, alert, and oriented x 3. No acute distress. Respiratory:  No increased work of breathing. No wheezes, rales, or rhonchi No tactile fremitus Cardiovascular:  Regular rate and rhythm No murmurs,  ectopy, or gallups. No lateral PMI. No thrills. Abdomen:  Abdomen is not soft, but not tense.  The abdomen is quite distended and tympanic to percussion.  The abdomen is non-tender No hernias, masses, or organomegaly Hypoactive bowel sounds.  Musculoskeletal:  No cyanosis, clubbing, or edema Skin:  No rashes, lesions, ulcers palpation of skin: no induration or nodules Neurologic:  CN 2-12 intact Sensation all 4 extremities intact Psychiatric:  Mental status Mood, affect appropriate Orientation to person, place, time  judgment and insight appear intact   I have personally reviewed the following:   Today's Data  Vitals  Lab Data  CBC, CMP  Micro Data    Imaging  CT abdomen and pelvis Portable abdomen film  Cardiology Data  EKG  Principal Problem:   SBO (small bowel obstruction) (HCC) Active Problems:   Dyslipidemia   Elevated LFTs   Hydronephrosis   LOS: 1 day   A & P    SBO (small bowel obstruction) (HCC) POA  Likely cause  Adhesions,  - admit for conservative management  - NG tube - NPO - KUB in AM - appreciate General surgery consult. They recommend slow decompression.     Dyslipidemia POA Hold lipitor for now given elevated LFt   Elevated LFTs POA Unclear etiology  Check hepatitis panel  likely in the setting of mild dehydration Check CK Lipid panel givn haptic statosis   Hydronephrosis POA Discussed with URology this is common in the setting  of reimplanted ureter and does not indicate a problem  I have seen and examined this patient myself. I have spent 34 minutes in her evaluation and care.  DVT prophylaxis: SCD's Code Status: Full Code Family Communication: Spouse is at bedside. All questions answered to the best of my ability. Disposition Plan: tbd    Alvia Jablonski, DO [facility] Direct contact: see [email or web address]  7PM-7AM contact night coverage as above 07/12/2021, 6:53 PM  LOS: 1 day

## 2021-07-12 NOTE — Progress Notes (Signed)
°   07/12/21 1100  Mobility  Activity Ambulated independently in hallway  Level of Assistance Modified independent, requires aide device or extra time  Assistive Device None  Distance Ambulated (ft) 400 ft  Activity Response Tolerated well  $Mobility charge 1 Mobility   Pt up ambulatin gin hall with husband upon arrival. Ambulated about 428ft in hall with no AD, tolerated well.  Pt left in bed with call bell at side.   Timoteo [employer] Mobility Specialist Acute Rehab Services Office: [phone]

## 2021-07-13 DIAGNOSIS — K56609 Unspecified intestinal obstruction, unspecified as to partial versus complete obstruction: Secondary | ICD-10-CM | POA: Diagnosis not present

## 2021-07-13 DIAGNOSIS — K5669 Other partial intestinal obstruction: Secondary | ICD-10-CM | POA: Diagnosis not present

## 2021-07-13 LAB — BASIC METABOLIC PANEL
Anion gap: 11 (ref 5–15)
BUN: 20 mg/dL (ref 8–23)
CO2: 24 mmol/L (ref 22–32)
Calcium: 9.3 mg/dL (ref 8.9–10.3)
Chloride: 104 mmol/L (ref 98–111)
Creatinine, Ser: 0.71 mg/dL (ref 0.44–1.00)
GFR, Estimated: >60 mL/min (ref >60)
Glucose, Bld: 112 mg/dL — ABNORMAL HIGH (ref 70–99)
Potassium: 3.7 mmol/L (ref 3.5–5.1)
Sodium: 139 mmol/L (ref 135–145)

## 2021-07-13 MED ORDER — LACTATED RINGERS IV SOLN
INTRAVENOUS | Status: DC
Start: 2021-07-13 — End: 2021-07-14

## 2021-07-13 NOTE — Assessment & Plan Note (Signed)
-   Likely cause Adhesions, - admit for conservative management - NG tube - NPO - appreciate General surgery consult. They recommend slow decompression. - The patient states that she has had 2 BM's this morning.  -Abdominal film performed this morning suggests a partial SBO at this point.

## 2021-07-13 NOTE — Assessment & Plan Note (Signed)
Lipitor held now due to elevated LFT's. Monitor.

## 2021-07-13 NOTE — Assessment & Plan Note (Signed)
-   Unclear etiology  Check hepatitis panel  likely in the setting of mild dehydration Check CK Lipid panel givn haptic steatosis Improving. Continue to monitor.

## 2021-07-13 NOTE — Progress Notes (Signed)
°   07/13/21 1100  Mobility  Activity Ambulated independently in hallway  Level of Assistance Independent  Assistive Device None  Distance Ambulated (ft) 1000 ft  Activity Response Tolerated well  $Mobility charge 1 Mobility   Pt agreeable to mobilize this morning. Ambulated about 1044ft in hall with no AD, tolerated well. No complaints. Left pt in bed, call bell at side. RN/NT notified of session.   [facility] [employer] Mobility Specialist Acute Rehab Services Office: [phone]

## 2021-07-13 NOTE — Assessment & Plan Note (Signed)
-   This was discussed with urology on admission. This is common in the setting of reimplanted ureter and does not indicate a problem.

## 2021-07-13 NOTE — Progress Notes (Signed)
Progress Note     Subjective: Abdominal pain remains resolved. No nausea or emesis. Has had 2 bowel movements   Objective: Vital signs in last 24 hours: Temp:  [97.5 F (36.4 C)-98 F (36.7 C)] 98 F (36.7 C) (02/23 0454) Pulse Rate:  [58-66] 64 (02/23 0454) Resp:  [18-20] 20 (02/23 0454) BP: (150-162)/(55-60) 162/60 (02/23 0454) SpO2:  [91 %-96 %] 91 % (02/23 0454) Last BM Date : 07/11/21  Intake/Output from previous day: 02/22 0701 - 02/23 0700 In: 0  Out: 150 [Emesis/NG output:150] Intake/Output this shift: No intake/output data recorded.  PE: General: pleasant, WD, female who is laying in bed in NAD HEENT: head is normocephalic, atraumatic. Mouth is pink and moist Heart: regular, rate, and rhythm.  Palpable radial pulses bilaterally Lungs:  Respiratory effort nonlabored Abd: soft, NT, ND, +BS. NGT with thin yellow output MSK: all 4 extremities are symmetrical with no cyanosis, clubbing, or edema. Skin: warm and dry Psych: A&Ox3 with an appropriate affect.    Lab Results:  Recent Labs    07/11/21 0829 07/12/21 0520  WBC 7.2 5.7  HGB 15.4* 13.1  HCT 45.1 39.3  PLT 265 226   BMET Recent Labs    07/11/21 1944 07/12/21 0520  NA 138 138  K 3.8 3.8  CL 103 108  CO2 29 26  GLUCOSE 96 91  BUN 13 15  CREATININE 0.62 0.61  CALCIUM 9.2 8.5*   PT/INR Recent Labs    07/11/21 1944  LABPROT 12.8  INR 1.0   CMP     Component Value Date/Time   NA 138 07/12/2021 0520   K 3.8 07/12/2021 0520   CL 108 07/12/2021 0520   CO2 26 07/12/2021 0520   GLUCOSE 91 07/12/2021 0520   BUN 15 07/12/2021 0520   CREATININE 0.61 07/12/2021 0520   CALCIUM 8.5 (L) 07/12/2021 0520   PROT 6.4 (L) 07/12/2021 0520   ALBUMIN 3.5 07/12/2021 0520   AST 43 (H) 07/12/2021 0520   ALT 71 (H) 07/12/2021 0520   ALKPHOS 83 07/12/2021 0520   BILITOT 0.4 07/12/2021 0520   GFRNONAA >60 07/12/2021 0520   Lipase     Component Value Date/Time   LIPASE 25 07/11/2021 0829        Studies/Results: DG Chest 2 View  Result Date: 07/11/2021 CLINICAL DATA:  epigastric pain EXAM: CHEST - 2 VIEW COMPARISON:  None. FINDINGS: Low lung volumes. No visible pleural effusions or pneumothorax. Cardiomediastinal silhouette is within normal limits. No evidence of acute osseous abnormality. IMPRESSION: Low lung volumes without evidence of acute cardiopulmonary disease. Electronically Signed   By: Feliberto Harts M.D.   On: 07/11/2021 09:01   DG Abd 1 View  Result Date: 07/12/2021 CLINICAL DATA:  Follow-up small bowel obstruction. EXAM: ABDOMEN - 1 VIEW COMPARISON:  Abdomen film yesterday at 3:35 p.m., CT yesterday at 12:39 p.m. FINDINGS: 4:46 a.m., 07/12/2021. Dilated small bowel is again noted up to 3.7 cm caliber in the lower abdomen. Other dilated segments seen on CT are not readily apparent due to fluid filling. This is probably not significantly changed. There are nonobstructive stones in the right kidney with no other pathologic calcifications seen. Bowel gas and stool continue to be noted through the colon into the rectum. NGT is coiled in the stomach with tip in the antrum. Old surgical clips right pelvic sidewall. There is contrast in the bladder. IMPRESSION: Small-bowel dilatation approaching 4 cm is still seen but no worsening bowel dilatation is evident. Right-sided nephrolithiasis.  Electronically Signed   By: Almira Bar M.D.   On: 07/12/2021 07:09   DG Abdomen 1 View  Result Date: 07/11/2021 CLINICAL DATA:  NG tube placement EXAM: ABDOMEN - 1 VIEW COMPARISON:  Previous studies including the chest radiographs done on 07/11/2021 FINDINGS: There is interval placement of nasogastric tube. Distal portion of NG tube is coiled within the fundus of the stomach with its tip in the medial aspect of fundus of the stomach close to the gastroesophageal junction. Bowel gas pattern is nonspecific. There is poor inspiration. IMPRESSION: Distal portion of NG tube is noted coiled  within the fundus of the stomach with its tip in the medial aspect of the fundus close to the gastroesophageal junction. Electronically Signed   By: Ernie Avena M.D.   On: 07/11/2021 15:42   CT ABDOMEN PELVIS W CONTRAST  Result Date: 07/11/2021 CLINICAL DATA:  Right lower quadrant pain. EXAM: CT ABDOMEN AND PELVIS WITH CONTRAST TECHNIQUE: Multidetector CT imaging of the abdomen and pelvis was performed using the standard protocol following bolus administration of intravenous contrast. RADIATION DOSE REDUCTION: This exam was performed according to the departmental dose-optimization program which includes automated exposure control, adjustment of the mA and/or kV according to patient size and/or use of iterative reconstruction technique. CONTRAST:  OMNIPAQUE IOHEXOL 300 MG/ML  SOLN COMPARISON:  None. FINDINGS: Lower chest: No acute abnormality. Hepatobiliary: No focal liver abnormality is seen. No gallstones, gallbladder wall thickening, or biliary dilatation. Pancreas: Unremarkable. No pancreatic ductal dilatation or surrounding inflammatory changes. Spleen: Normal in size without focal abnormality. Adrenals/Urinary Tract: Adrenal glands are unremarkable. Nonobstructing 6 mm right inferior pole renal calculus. Dystrophic calcification and scarring in the right mid kidney. Severe right hydronephrosis and moderate right hydroureter. Patient is status post reimplantation of the right ureter. Bladder is unremarkable. Stomach/Bowel: Small bowel dilatation measuring up to 4 cm with bowel wall thickening in the mid abdomen concerning for small bowel obstruction likely secondary to adhesions. With a relative transition point in the mid lower abdomen. No pneumoperitoneum, pneumatosis or portal venous gas. Diverticulosis without evidence of diverticulitis. Vascular/Lymphatic: Aortic atherosclerosis. No enlarged abdominal or pelvic lymph nodes. Reproductive: Uterus and bilateral adnexa are unremarkable. Other:  Small amount of pelvic free fluid.  No abdominal hernia. Musculoskeletal: No acute osseous abnormality. No aggressive osseous lesion. Grade 1 anterolisthesis of L4 on L5 secondary to bilateral L4 pars interarticularis defects. 2 mm retrolisthesis of L5 on S1. IMPRESSION: 1. Small bowel dilatation measuring up to 4 cm with bowel wall thickening in the mid abdomen concerning for small bowel obstruction likely secondary to adhesions. With a relative transition point in the mid lower abdomen. 2. Diverticulosis without evidence of diverticulitis. 3. Aortic Atherosclerosis (ICD10-I70.0). 4. Nonobstructing 6 mm right inferior pole renal calculus. 5. Severe right hydronephrosis and moderate right hydroureter. Patient is status post reimplantation of the right ureter. Electronically Signed   By: Elige Ko M.D.   On: 07/11/2021 12:51   US Abdomen Limited  Result Date: 07/11/2021 CLINICAL DATA:  Right upper quadrant abdominal pain. EXAM: ULTRASOUND ABDOMEN LIMITED RIGHT UPPER QUADRANT COMPARISON:  CT of the abdomen and pelvis 07/11/2021 FINDINGS: Gallbladder: No gallstones or wall thickening visualized. No sonographic Murphy sign noted by sonographer. Common bile duct: Diameter: 2 mm Liver: Diffuse fatty infiltration liver is present. No discrete lesions are present. Portal vein is patent on color Doppler imaging with normal direction of blood flow towards the liver. Other: Right hydronephrosis noted. IMPRESSION: 1. No acute abnormality. 2. Hepatic steatosis. 3.  Right hydronephrosis. Electronically Signed   By: Marin Roberts M.D.   On: 07/11/2021 12:53   DG Abd Portable 1V-Small Bowel Obstruction Protocol-initial, 8 hr delay  Result Date: 07/12/2021 CLINICAL DATA:  Follow up small bowel obstruction. EXAM: PORTABLE ABDOMEN - 1 VIEW COMPARISON:  Film from earlier in the same day. FINDINGS: Previously administered contrast now lies entirely within the colon consistent with a partial small bowel obstruction. Some  persistent small bowel dilatation is noted. Gastric catheter extends into the proximal duodenum. Postsurgical changes are noted. IMPRESSION: Contrast now lies entirely within the colon consistent with partial small bowel obstruction. Persistent small bowel dilatation is noted. Electronically Signed   By: Alcide Clever M.D.   On: 07/12/2021 19:35    Anti-infectives: Anti-infectives (From admission, onward)    None        Assessment/Plan SBO, likely adhesive - CT w/ CT scan showing small bowel dilatation measuring up to 4 cm concerning for small bowel obstruction likely secondary to adhesions with relative transition point in the mid lower abdomen - No current indication for emergency surgery - 8 hr delay abd xray with contrast in colon with persistent small bowel dilatation. Has had bowel movements - NGT output 300 ml overnight, clamp trial NGT and sips of clears from floor - Keep K > 4 and Mg > 2 for bowel function - Mobilize for bowel function - Hopefully patient will improve with conservative management. If patient fails to improve with conservative management, he may require exploratory surgery during admission     FEN: sips of clears, NGT clamped ID: none VTE: SCDs. Okay for chemical prophylaxis from surgical standpoint    I reviewed hospitalist notes, last 24 h vitals and pain scores, last 48 h intake and output, last 24 h labs and trends, and last 24 h imaging results.  This care required moderate level of medical decision making.    LOS: 2 days   Eric Form, [facility] 07/13/2021, 7:58 AM Please see Amion for pager number during day hours 7:00am-4:30pm

## 2021-07-13 NOTE — Plan of Care (Signed)

## 2021-07-13 NOTE — Progress Notes (Signed)
PROGRESS NOTE  Heather Villegas [id number] DOB: November 23, 1955 DOA: 07/11/2021 PCP: Libby Maw, MD  Brief History   Heather Villegas is a 66 y.o. female with medical history significant of vertigo and Mnire's disease, hyperlipidemia,      Presented with   nausea abdominal fullness Heartburn and indigestion for the past 3 days.  Upper upper abdominal pain 2 days ago some nausea no vomiting initially no fever Reports pressure-like sensation in her upper abdomen first became intermittently became constant.  Passing gas and still had normal bowel movements no last 1 was today.  No blood in stool no urinary complaints.  No fevers no chills no chest pain or shortness of breath  She have had Ureter implantation She have had problem with stents in the past Reports her Ureter was too small and was blocked so she had to have reconnected at the age of 54  2 attempted hysterectomy failed due to adhesions  and bowel resection  NGT was placed and has been clamped at the time of my visit. The patient states that she is feeling better, but abdomen remains quite distended.  General surgery has evaluated the patient. Due to the patient's history of severe adhesions and history of small bowel resection, they intend to slowly decompress the patient's abdomen.   The patient reports that she is feeling better. She has had 2 BM's this morning. No new complaints.  Consultants  General surgery.  Procedures  NGT placement.  Antibiotics   Anti-infectives (From admission, onward)    None      Subjective  The patient is resting in bed. NGT is clamped. No new complaints.  Objective   Vitals:  Vitals:   07/13/21 0454 07/13/21 1340  BP: (!) 162/60 (!) 161/64  Pulse: 64 63  Resp: 20 16  Temp: 98 F (36.7 C) 98 F (36.7 C)  SpO2: 91% 95%   Exam:  Constitutional:  The patient is awake, alert, and oriented x 3. No acute distress. Respiratory:  No increased work of breathing. No wheezes,  rales, or rhonchi No tactile fremitus Cardiovascular:  Regular rate and rhythm No murmurs, ectopy, or gallups. No lateral PMI. No thrills. Abdomen:  Abdomen is not soft, but not tense.  The abdomen is quite distended and tympanic to percussion.  The abdomen is non-tender No hernias, masses, or organomegaly Hypoactive bowel sounds.  Musculoskeletal:  No cyanosis, clubbing, or edema Skin:  No rashes, lesions, ulcers palpation of skin: no induration or nodules Neurologic:  CN 2-12 intact Sensation all 4 extremities intact Psychiatric:  Mental status Mood, affect appropriate Orientation to person, place, time  judgment and insight appear intact   I have personally reviewed the following:   Today's Data  Vitals  Lab Data  CBC, CMP  Micro Data    Imaging  CT abdomen and pelvis Portable abdomen film  Cardiology Data  EKG  Principal Problem:   SBO (small bowel obstruction) ([facility]) Active Problems:   Dyslipidemia   Elevated LFTs   Hydronephrosis   LOS: 2 days   A & P    Assessment and Plan: * SBO (small bowel obstruction) ([place])- (present on admission) Likely cause  Adhesions,  - admit for conservative management  - NG tube - NPO - appreciate General surgery consult. They recommend slow decompression. - The patient states that she has had 2 BM's this morning.  -Abdominal film performed this morning suggests a partial SBO at this point.  Hydronephrosis- (present on admission) This was discussed with  urology on admission. This is common in the setting of reimplanted ureter and does not indicate a problem.  Elevated LFTs- (present on admission) Unclear etiology  Check hepatitis panel  likely in the setting of mild dehydration Check CK Lipid panel givn haptic steatosis Improving. Continue to monitor.    Dyslipidemia- (present on admission) Lipitor held now due to elevated LFT's. Monitor.      I have seen and examined this patient myself. I have  spent 32 minutes in her evaluation and care.  DVT prophylaxis: SCD's Code Status: Full Code Family Communication: Spouse is at bedside. All questions answered to the best of my ability. Disposition Plan: tbd    Azaryah Oleksy, DO [facility] Direct contact: see [email or web address]  7PM-7AM contact night coverage as above 07/13/2021, 3:31 PM  LOS: 1 day

## 2021-07-14 ENCOUNTER — Encounter: Payer: Self-pay | Admitting: General Surgery

## 2021-07-14 DIAGNOSIS — K5669 Other partial intestinal obstruction: Secondary | ICD-10-CM | POA: Diagnosis not present

## 2021-07-14 DIAGNOSIS — K56609 Unspecified intestinal obstruction, unspecified as to partial versus complete obstruction: Secondary | ICD-10-CM | POA: Diagnosis not present

## 2021-07-14 LAB — CBC
HCT: 39.6 % (ref 36.0–46.0)
Hemoglobin: 13.2 g/dL (ref 12.0–15.0)
MCH: 30.5 pg (ref 26.0–34.0)
MCHC: 33.3 g/dL (ref 30.0–36.0)
MCV: 91.5 fL (ref 80.0–100.0)
Platelets: 229 10*3/uL (ref 150–400)
RBC: 4.33 MIL/uL (ref 3.87–5.11)
RDW: 12.7 % (ref 11.5–15.5)
WBC: 5.3 10*3/uL (ref 4.0–10.5)
nRBC: 0 % (ref 0.0–0.2)

## 2021-07-14 LAB — BASIC METABOLIC PANEL
Anion gap: 8 (ref 5–15)
BUN: 14 mg/dL (ref 8–23)
CO2: 27 mmol/L (ref 22–32)
Calcium: 8.9 mg/dL (ref 8.9–10.3)
Chloride: 103 mmol/L (ref 98–111)
Creatinine, Ser: 0.64 mg/dL (ref 0.44–1.00)
GFR, Estimated: >60 mL/min (ref >60)
Glucose, Bld: 91 mg/dL (ref 70–99)
Potassium: 3.5 mmol/L (ref 3.5–5.1)
Sodium: 138 mmol/L (ref 135–145)

## 2021-07-14 LAB — MAGNESIUM: Magnesium: 2 mg/dL (ref 1.7–2.4)

## 2021-07-14 NOTE — Progress Notes (Signed)
Work note from [place], [place] provided to pt.

## 2021-07-14 NOTE — Plan of Care (Signed)
  Problem: Education: Goal: Knowledge of General Education information will improve Description: Including pain rating scale, medication(s)/side effects and non-pharmacologic comfort measures Outcome: Progressing   Problem: Health Behavior/Discharge Planning: Goal: Ability to manage health-related needs will improve Outcome: Progressing   Problem: Clinical Measurements: Goal: Ability to maintain clinical measurements within normal limits will improve Outcome: Progressing Goal: Will remain free from infection Outcome: Progressing Goal: Respiratory complications will improve Outcome: Progressing   Problem: Activity: Goal: Risk for activity intolerance will decrease Outcome: Progressing   Problem: Nutrition: Goal: Adequate nutrition will be maintained Outcome: Progressing   Problem: Elimination: Goal: Will not experience complications related to bowel motility Outcome: Progressing Goal: Will not experience complications related to urinary retention Outcome: Progressing   Problem: Pain Managment: Goal: General experience of comfort will improve Outcome: Progressing   Problem: Safety: Goal: Ability to remain free from injury will improve Outcome: Progressing   Problem: Skin Integrity: Goal: Risk for impaired skin integrity will decrease Outcome: Progressing   

## 2021-07-14 NOTE — Discharge Summary (Signed)
Physician Discharge Summary   Patient: Heather Villegas MRN: [id number] DOB: 09/07/1955  Admit date:     07/11/2021  Discharge date: 07/14/21  Discharge Physician: Karie Kirks   PCP: Libby Maw, MD   Recommendations at discharge:    Discharge to home Follow up with PCP in 7-10 days. BMP should be drawn on this visit and reported to PCP.  Discharge Diagnoses: Principal Problem:   SBO (small bowel obstruction) (HCC) Active Problems:   Dyslipidemia   Elevated LFTs   Hydronephrosis  Resolved Problems:   * No resolved hospital problems. [facility] Course: Heather Villegas is a 66 y.o. female with medical history significant of vertigo and Mnire's disease, hyperlipidemia,      Presented with   nausea abdominal fullness Heartburn and indigestion for the past 3 days.  Upper upper abdominal pain 2 days ago some nausea no vomiting initially no fever Reports pressure-like sensation in her upper abdomen first became intermittently became constant.  Passing gas and still had normal bowel movements no last 1 was today.  No blood in stool no urinary complaints.  No fevers no chills no chest pain or shortness of breath  She have had Ureter implantation She have had problem with stents in the past Reports her Ureter was too small and was blocked so she had to have reconnected at the age of 53  2 attempted hysterectomy failed due to adhesions  and bowel resection   NGT was placed and has been clamped at the time of my visit. The patient states that she is feeling better, but abdomen remains quite distended.   General surgery has evaluated the patient. Due to the patient's history of severe adhesions and history of small bowel resection, they intend to slowly decompress the patient's abdomen.    The patient reports that she is feeling better. She is having BM's. Diet has been advanced to a full liquid diet. She has been cleared for discharge to home by general surgery. She has no new  complaints.  Assessment and Plan: * SBO (small bowel obstruction) ([place])- (present on admission) Likely cause  Adhesions,  - admit for conservative management  - NG tube - NPO - appreciate General surgery consult. They recommend slow decompression. - The patient states that she has had 2 BM's this morning.  -Abdominal film performed this morning suggests a partial SBO at this point.  Hydronephrosis- (present on admission) This was discussed with urology on admission. This is common in the setting of reimplanted ureter and does not indicate a problem.  Elevated LFTs- (present on admission) Unclear etiology  Check hepatitis panel  likely in the setting of mild dehydration Check CK Lipid panel givn haptic steatosis Improving. Continue to monitor.    Dyslipidemia- (present on admission) Lipitor held now due to elevated LFT's. Monitor.   Consultants: General surgery Procedures performed: NGT  Disposition: Home Diet recommendation:  Discharge Diet Orders (From admission, onward)     Start     Ordered   07/14/21 0000  Diet - low sodium heart healthy        07/14/21 1508           Cardiac diet  DISCHARGE MEDICATION: Allergies as of 07/14/2021       Reactions   Tape    Surgical/breaks down skin        Medication List     STOP taking these medications    diazepam 2 MG tablet Commonly known as: Valium  TAKE these medications    atorvastatin 10 MG tablet Commonly known as: LIPITOR Take 1 tablet (10 mg total) by mouth daily for 15 days. What changed: Another medication with the same name was removed. Continue taking this medication, and follow the directions you see here.   b complex vitamins tablet Take 1 tablet by mouth daily.   CO Q 10 PO Take 1 capsule by mouth daily.   Fish Oil 1000 MG Caps Take 1 capsule by mouth daily.   multivitamin capsule Take 1 capsule by mouth daily.   ondansetron 4 MG disintegrating tablet Commonly known as:  Zofran ODT Take 1 tablet (4 mg total) by mouth every 8 (eight) hours as needed. What changed: reasons to take this   VITAMIN C PO Take 2 capsules by mouth daily. Take 2 capsules daily   VITAMIN D PO Take 2 tablets by mouth daily.         Discharge Exam: Filed Weights   07/11/21 0822 07/11/21 1853  Weight: 87.5 kg 88.6 kg   Vitals:   07/14/21 0511 07/14/21 1244  BP: (!) 152/59 (!) 152/53  Pulse: (!) 57 (!) 55  Resp: 16 18  Temp: 98.3 F (36.8 C) 98 F (36.7 C)  SpO2: 97% 100%   Exam:  Constitutional:  The patient is awake, alert, and oriented x 3. No acute distress. Respiratory:  No increased work of breathing. No wheezes, rales, or rhonchi No tactile fremitus Cardiovascular:  Regular rate and rhythm No murmurs, ectopy, or gallups. No lateral PMI. No thrills. Abdomen:  Abdomen is soft, non-tender, non-distended No hernias, masses, or organomegaly Normoactive bowel sounds.  Musculoskeletal:  No cyanosis, clubbing, or edema Skin:  No rashes, lesions, ulcers palpation of skin: no induration or nodules Neurologic:  CN 2-12 intact Sensation all 4 extremities intact Psychiatric:  Mental status Mood, affect appropriate Orientation to person, place, time  judgment and insight appear intact   Condition at discharge: fair  The results of significant diagnostics from this hospitalization (including imaging, microbiology, ancillary and laboratory) are listed below for reference.   Imaging Studies: DG Chest 2 View  Result Date: 07/11/2021 CLINICAL DATA:  epigastric pain EXAM: CHEST - 2 VIEW COMPARISON:  None. FINDINGS: Low lung volumes. No visible pleural effusions or pneumothorax. Cardiomediastinal silhouette is within normal limits. No evidence of acute osseous abnormality. IMPRESSION: Low lung volumes without evidence of acute cardiopulmonary disease. Electronically Signed   By: Margaretha Sheffield M.D.   On: 07/11/2021 09:01   DG Abd 1 View  Result Date:  07/12/2021 CLINICAL DATA:  Follow-up small bowel obstruction. EXAM: ABDOMEN - 1 VIEW COMPARISON:  Abdomen film yesterday at 3:35 p.m., CT yesterday at 12:39 p.m. FINDINGS: 4:46 a.m., 07/12/2021. Dilated small bowel is again noted up to 3.7 cm caliber in the lower abdomen. Other dilated segments seen on CT are not readily apparent due to fluid filling. This is probably not significantly changed. There are nonobstructive stones in the right kidney with no other pathologic calcifications seen. Bowel gas and stool continue to be noted through the colon into the rectum. NGT is coiled in the stomach with tip in the antrum. Old surgical clips right pelvic sidewall. There is contrast in the bladder. IMPRESSION: Small-bowel dilatation approaching 4 cm is still seen but no worsening bowel dilatation is evident. Right-sided nephrolithiasis. Electronically Signed   By: Telford Nab M.D.   On: 07/12/2021 07:09   DG Abdomen 1 View  Result Date: 07/11/2021 CLINICAL DATA:  NG tube placement  EXAM: ABDOMEN - 1 VIEW COMPARISON:  Previous studies including the chest radiographs done on 07/11/2021 FINDINGS: There is interval placement of nasogastric tube. Distal portion of NG tube is coiled within the fundus of the stomach with its tip in the medial aspect of fundus of the stomach close to the gastroesophageal junction. Bowel gas pattern is nonspecific. There is poor inspiration. IMPRESSION: Distal portion of NG tube is noted coiled within the fundus of the stomach with its tip in the medial aspect of the fundus close to the gastroesophageal junction. Electronically Signed   By: Elmer Picker M.D.   On: 07/11/2021 15:42   CT ABDOMEN PELVIS W CONTRAST  Result Date: 07/11/2021 CLINICAL DATA:  Right lower quadrant pain. EXAM: CT ABDOMEN AND PELVIS WITH CONTRAST TECHNIQUE: Multidetector CT imaging of the abdomen and pelvis was performed using the standard protocol following bolus administration of intravenous contrast.  RADIATION DOSE REDUCTION: This exam was performed according to the departmental dose-optimization program which includes automated exposure control, adjustment of the mA and/or kV according to patient size and/or use of iterative reconstruction technique. CONTRAST:  123mL OMNIPAQUE IOHEXOL 300 MG/ML  SOLN COMPARISON:  None. FINDINGS: Lower chest: No acute abnormality. Hepatobiliary: No focal liver abnormality is seen. No gallstones, gallbladder wall thickening, or biliary dilatation. Pancreas: Unremarkable. No pancreatic ductal dilatation or surrounding inflammatory changes. Spleen: Normal in size without focal abnormality. Adrenals/Urinary Tract: Adrenal glands are unremarkable. Nonobstructing 6 mm right inferior pole renal calculus. Dystrophic calcification and scarring in the right mid kidney. Severe right hydronephrosis and moderate right hydroureter. Patient is status post reimplantation of the right ureter. Bladder is unremarkable. Stomach/Bowel: Small bowel dilatation measuring up to 4 cm with bowel wall thickening in the mid abdomen concerning for small bowel obstruction likely secondary to adhesions. With a relative transition point in the mid lower abdomen. No pneumoperitoneum, pneumatosis or portal venous gas. Diverticulosis without evidence of diverticulitis. Vascular/Lymphatic: Aortic atherosclerosis. No enlarged abdominal or pelvic lymph nodes. Reproductive: Uterus and bilateral adnexa are unremarkable. Other: Small amount of pelvic free fluid.  No abdominal hernia. Musculoskeletal: No acute osseous abnormality. No aggressive osseous lesion. Grade 1 anterolisthesis of L4 on L5 secondary to bilateral L4 pars interarticularis defects. 2 mm retrolisthesis of L5 on S1. IMPRESSION: 1. Small bowel dilatation measuring up to 4 cm with bowel wall thickening in the mid abdomen concerning for small bowel obstruction likely secondary to adhesions. With a relative transition point in the mid lower abdomen. 2.  Diverticulosis without evidence of diverticulitis. 3. Aortic Atherosclerosis (ICD10-I70.0). 4. Nonobstructing 6 mm right inferior pole renal calculus. 5. Severe right hydronephrosis and moderate right hydroureter. Patient is status post reimplantation of the right ureter. Electronically Signed   By: Kathreen Devoid M.D.   On: 07/11/2021 12:51   US Abdomen Limited  Result Date: 07/11/2021 CLINICAL DATA:  Right upper quadrant abdominal pain. EXAM: ULTRASOUND ABDOMEN LIMITED RIGHT UPPER QUADRANT COMPARISON:  CT of the abdomen and pelvis 07/11/2021 FINDINGS: Gallbladder: No gallstones or wall thickening visualized. No sonographic Murphy sign noted by sonographer. Common bile duct: Diameter: 2 mm Liver: Diffuse fatty infiltration liver is present. No discrete lesions are present. Portal vein is patent on color Doppler imaging with normal direction of blood flow towards the liver. Other: Right hydronephrosis noted. IMPRESSION: 1. No acute abnormality. 2. Hepatic steatosis. 3. Right hydronephrosis. Electronically Signed   By: San Morelle M.D.   On: 07/11/2021 12:53   DG Abd Portable 1V-Small Bowel Obstruction Protocol-initial, 8 hr delay  Result  Date: 07/12/2021 CLINICAL DATA:  Follow up small bowel obstruction. EXAM: PORTABLE ABDOMEN - 1 VIEW COMPARISON:  Film from earlier in the same day. FINDINGS: Previously administered contrast now lies entirely within the colon consistent with a partial small bowel obstruction. Some persistent small bowel dilatation is noted. Gastric catheter extends into the proximal duodenum. Postsurgical changes are noted. IMPRESSION: Contrast now lies entirely within the colon consistent with partial small bowel obstruction. Persistent small bowel dilatation is noted. Electronically Signed   By: Inez Catalina M.D.   On: 07/12/2021 19:35    Microbiology: Results for orders placed or performed during the hospital encounter of 07/11/21  Resp Panel by RT-PCR (Flu A&B, Covid)  Nasopharyngeal Swab     Status: None   Collection Time: 07/11/21  2:19 PM   Specimen: Nasopharyngeal Swab; Nasopharyngeal(NP) swabs in vial transport medium  Result Value Ref Range Status   SARS Coronavirus 2 by RT PCR NEGATIVE NEGATIVE Final    Comment: (NOTE) SARS-CoV-2 target nucleic acids are NOT DETECTED.  The SARS-CoV-2 RNA is generally detectable in upper respiratory specimens during the acute phase of infection. The lowest concentration of SARS-CoV-2 viral copies this assay can detect is 138 copies/mL. A negative result does not preclude SARS-Cov-2 infection and should not be used as the sole basis for treatment or other patient management decisions. A negative result may occur with  improper specimen collection/handling, submission of specimen other than nasopharyngeal swab, presence of viral mutation(s) within the areas targeted by this assay, and inadequate number of viral copies(<138 copies/mL). A negative result must be combined with clinical observations, patient history, and epidemiological information. The expected result is Negative.  Fact Sheet for Patients:  EntrepreneurPulse.com.au  Fact Sheet for Healthcare Providers:  IncredibleEmployment.be  This test is no t yet approved or cleared by the [place] FDA and  has been authorized for detection and/or diagnosis of SARS-CoV-2 by FDA under an Emergency Use Authorization (EUA). This EUA will remain  in effect (meaning this test can be used) for the duration of the COVID-19 declaration under Section 564(b)(1) of the Act, 21 U.S.C.section 360bbb-3(b)(1), unless the authorization is terminated  or revoked sooner.       Influenza A by PCR NEGATIVE NEGATIVE Final   Influenza B by PCR NEGATIVE NEGATIVE Final    Comment: (NOTE) The Xpert Xpress SARS-CoV-2/FLU/RSV plus assay is intended as an aid in the diagnosis of influenza from Nasopharyngeal swab specimens and should not be  used as a sole basis for treatment. Nasal washings and aspirates are unacceptable for Xpert Xpress SARS-CoV-2/FLU/RSV testing.  Fact Sheet for Patients: EntrepreneurPulse.com.au  Fact Sheet for Healthcare Providers: IncredibleEmployment.be  This test is not yet approved or cleared by the [place] FDA and has been authorized for detection and/or diagnosis of SARS-CoV-2 by FDA under an Emergency Use Authorization (EUA). This EUA will remain in effect (meaning this test can be used) for the duration of the COVID-19 declaration under Section 564(b)(1) of the Act, 21 U.S.C. section 360bbb-3(b)(1), unless the authorization is terminated or revoked.  Performed at [facility], [address] [place], [place] [address]     Labs: CBC: Recent Labs  Lab 07/11/21 [phone] 07/12/21 0520 07/14/21 0526  WBC 7.2 5.7 5.3  NEUTROABS  --  3.9  --   HGB 15.4* 13.1 13.2  HCT 45.1 39.3 39.6  MCV 90.2 92.9 91.5  PLT 265 226 [id number]   Basic Metabolic Panel: Recent Labs  Lab 07/11/21 0829 07/11/21 1944  07/12/21 0520 07/13/21 0956 07/14/21 0526  NA 137 138 138 139 138  K 4.2 3.8 3.8 3.7 3.5  CL 104 103 108 104 103  CO2 26 29 26 24 27   GLUCOSE 117* 96 91 112* 91  BUN 14 13 15 20 14   CREATININE 0.61 0.62 0.61 0.71 0.64  CALCIUM 9.5 9.2 8.5* 9.3 8.9  MG  --  2.3 2.1  --  2.0  PHOS  --   --  3.4  --   --    Liver Function Tests: Recent Labs  Lab 07/11/21 0829 07/11/21 1944 07/12/21 0520  AST 105* 56* 43*  ALT 112* 87* 71*  ALKPHOS 103 97 83  BILITOT 0.7 0.6 0.4  PROT 7.9 7.0 6.4*  ALBUMIN 4.2 3.9 3.5   CBG: No results for input(s): GLUCAP in the last 168 hours.  Discharge time spent: greater than 30 minutes.  Signed: Karie Kirks, DO [facility] 07/14/2021

## 2021-07-14 NOTE — Progress Notes (Signed)
Progress Note     Subjective: Tolerated clears well and continues to have bowel movements. No recurrence of abdominal pain or nausea. Ambulating    Objective: Vital signs in last 24 hours: Temp:  [98 F (36.7 C)-98.6 F (37 C)] 98.3 F (36.8 C) (02/24 0511) Pulse Rate:  [57-67] 57 (02/24 0511) Resp:  [16-19] 16 (02/24 0511) BP: (152-161)/(52-64) 152/59 (02/24 0511) SpO2:  [95 %-98 %] 97 % (02/24 0511) Last BM Date : 07/13/21  Intake/Output from previous day: 02/23 0701 - 02/24 0700 In: 1598.3 [P.O.:490; I.V.:1108.3] Out: 50 [Emesis/NG output:50] Intake/Output this shift: No intake/output data recorded.  PE: General: pleasant, WD, female who is laying in bed in NAD HEENT: head is normocephalic, atraumatic. Mouth is pink and moist Heart: regular, rate, and rhythm.  Palpable radial pulses bilaterally Lungs:  Respiratory effort nonlabored Abd: soft, NT, ND, +BS MSK: all 4 extremities are symmetrical with no cyanosis, clubbing, or edema. Skin: warm and dry Psych: A&Ox3 with an appropriate affect.    Lab Results:  Recent Labs    07/12/21 0520 07/14/21 0526  WBC 5.7 5.3  HGB 13.1 13.2  HCT 39.3 39.6  PLT 226 229    BMET Recent Labs    07/13/21 0956 07/14/21 0526  NA 139 138  K 3.7 3.5  CL 104 103  CO2 24 27  GLUCOSE 112* 91  BUN 20 14  CREATININE 0.71 0.64  CALCIUM 9.3 8.9    PT/INR Recent Labs    07/11/21 1944  LABPROT 12.8  INR 1.0    CMP     Component Value Date/Time   NA 138 07/14/2021 0526   K 3.5 07/14/2021 0526   CL 103 07/14/2021 0526   CO2 27 07/14/2021 0526   GLUCOSE 91 07/14/2021 0526   BUN 14 07/14/2021 0526   CREATININE 0.64 07/14/2021 0526   CALCIUM 8.9 07/14/2021 0526   PROT 6.4 (L) 07/12/2021 0520   ALBUMIN 3.5 07/12/2021 0520   AST 43 (H) 07/12/2021 0520   ALT 71 (H) 07/12/2021 0520   ALKPHOS 83 07/12/2021 0520   BILITOT 0.4 07/12/2021 0520   GFRNONAA >60 07/14/2021 0526   Lipase     Component Value Date/Time    LIPASE 25 07/11/2021 0829       Studies/Results: DG Abd Portable 1V-Small Bowel Obstruction Protocol-initial, 8 hr delay  Result Date: 07/12/2021 CLINICAL DATA:  Follow up small bowel obstruction. EXAM: PORTABLE ABDOMEN - 1 VIEW COMPARISON:  Film from earlier in the same day. FINDINGS: Previously administered contrast now lies entirely within the colon consistent with a partial small bowel obstruction. Some persistent small bowel dilatation is noted. Gastric catheter extends into the proximal duodenum. Postsurgical changes are noted. IMPRESSION: Contrast now lies entirely within the colon consistent with partial small bowel obstruction. Persistent small bowel dilatation is noted. Electronically Signed   By: Alcide Clever M.D.   On: 07/12/2021 19:35    Anti-infectives: Anti-infectives (From admission, onward)    None        Assessment/Plan SBO, likely adhesive - CT w/ CT scan showing small bowel dilatation measuring up to 4 cm concerning for small bowel obstruction likely secondary to adhesions with relative transition point in the mid lower abdomen - No current indication for emergency surgery - 8 hr delay abd xray with contrast in colon with persistent small bowel dilatation. Has had bowel movements - NGT out yesterday and tolerating CLD - Keep K > 4 and Mg > 2 for bowel function -  Mobilize for bowel function  Patient is improving with conservative management - will advance to full liquid diet for lunch and advance as tolerated to soft for dinner however encouraged her to go slow and can continue predominately full liquids depending on how she feels. Likely stable for discharge this evening vs am tomorrow   FEN: FLD ADAT soft ID: none VTE: SCDs. Okay for chemical prophylaxis from surgical standpoint    I reviewed hospitalist notes, last 24 h vitals and pain scores, last 48 h intake and output, and last 24 h labs and trends.  This care required moderate level of medical decision  making.    LOS: 3 days   Eric Form, [facility] 07/14/2021, 8:38 AM Please see Amion for pager number during day hours 7:00am-4:30pm

## 2021-07-14 NOTE — Progress Notes (Signed)
AVS given and reviewed with pt. Medications discussed. All questions answered to satisfaction. Pt verbalized understanding of information given. Pt escorted off the unit with all belongings via wheelchair by staff member.  

## 2021-07-24 ENCOUNTER — Ambulatory Visit: Payer: Federal, State, Local not specified - PPO | Admitting: Family Medicine

## 2021-07-24 ENCOUNTER — Encounter: Payer: Self-pay | Admitting: Family Medicine

## 2021-07-24 ENCOUNTER — Other Ambulatory Visit: Payer: Self-pay

## 2021-07-24 VITALS — BP 122/70 | HR 61 | Temp 97.7°F | Ht 66.0 in | Wt 185.0 lb

## 2021-07-24 DIAGNOSIS — R7309 Other abnormal glucose: Secondary | ICD-10-CM

## 2021-07-24 DIAGNOSIS — R7989 Other specified abnormal findings of blood chemistry: Secondary | ICD-10-CM

## 2021-07-24 DIAGNOSIS — Z09 Encounter for follow-up examination after completed treatment for conditions other than malignant neoplasm: Secondary | ICD-10-CM | POA: Insufficient documentation

## 2021-07-24 LAB — BASIC METABOLIC PANEL
BUN: 14 mg/dL (ref 6–23)
CO2: 31 mEq/L (ref 19–32)
Calcium: 10 mg/dL (ref 8.4–10.5)
Chloride: 101 mEq/L (ref 96–112)
Creatinine, Ser: 0.71 mg/dL (ref 0.40–1.20)
GFR: 90.22 mL/min (ref >60.00)
Glucose, Bld: 85 mg/dL (ref 70–99)
Potassium: 4.2 mEq/L (ref 3.5–5.1)
Sodium: 139 mEq/L (ref 135–145)

## 2021-07-24 LAB — HEPATIC FUNCTION PANEL
ALT: 29 U/L (ref 0–35)
AST: 19 U/L (ref 0–37)
Albumin: 4.3 g/dL (ref 3.5–5.2)
Alkaline Phosphatase: 115 U/L (ref 39–117)
Bilirubin, Direct: 0.1 mg/dL (ref 0.0–0.3)
Total Bilirubin: 0.5 mg/dL (ref 0.2–1.2)
Total Protein: 7.7 g/dL (ref 6.0–8.3)

## 2021-07-24 LAB — CBC
HCT: 40.7 % (ref 36.0–46.0)
Hemoglobin: 13.8 g/dL (ref 12.0–15.0)
MCHC: 34 g/dL (ref 30.0–36.0)
MCV: 90 fl (ref 78.0–100.0)
Platelets: 367 10*3/uL (ref 150.0–400.0)
RBC: 4.52 Mil/uL (ref 3.87–5.11)
RDW: 12.9 % (ref 11.5–15.5)
WBC: 5.3 10*3/uL (ref 4.0–10.5)

## 2021-07-24 LAB — HEMOGLOBIN A1C: Hgb A1c MFr Bld: 5.7 % (ref 4.6–6.5)

## 2021-07-24 NOTE — Progress Notes (Signed)
Established Patient Office Visit  Subjective:  Patient ID: Heather Villegas, female    DOB: [birth year]  Age: 66 y.o. MRN: [id number]  CC:  Chief Complaint  Patient presents with   Hospitalization [facility] follow up, seen for bowel obstruction. Discuss diet. Patient fasting.     HPI Heather Villegas presents for hospital discharge follow-up status post admission for small bowel obstruction believed to be associated with adhesions.  She has done well status post discharge.  Continues on a soft diet.  Denies abdominal pain fevers chills nausea or vomiting.  She is only stooling every other day when previously she had stooled daily.  Liver enzymes had been elevated in the hospital.  2 days prior to admission she had to visit friends at the beach and enjoyed some wine with them.  Rarely drinks otherwise.  CT of abdomen revealed normal liver with evidence of SBO secondary to adhesions.  Glucose had been elevated mildly in the hospital.  She has no history of diabetes.  Past Medical History:  Diagnosis Date   Deafness in right ear    Heart murmur    Hyperlipidemia    diet  controlled   Meniere disease    Post-operative nausea and vomiting    Vertigo    To ED Friday night 9-10- was given Meclazine - improved     Past Surgical History:  Procedure Laterality Date   ABDOMINAL HYSTERECTOMY     2 attempts/ unable to remove uterus/ had a lot bleeding until ablation was done   ABLATION     bowel reconstruction  2003   had adhesions/ removed part of small intestine   BREAST BIOPSY Left 2013   left/ precancerous lesion   CATARACT EXTRACTION     left eye   KIDNEY STONE SURGERY  2018   lithotripsy   TONSILLECTOMY     TUBAL LIGATION     ureter reconnection     URETERAL REIMPLANTION      Family History  Problem Relation Age of Onset   Lung cancer Mother    Lung cancer Father    Thrombosis Father    Drug abuse Brother    Colon cancer Neg Hx    Colon polyps Neg Hx    Esophageal  cancer Neg Hx    Rectal cancer Neg Hx    Stomach cancer Neg Hx     Social History   Socioeconomic History   Marital status: Married    Spouse name: Not on file   Number of children: 3   Years of education: 12   Highest education level: High school graduate  Occupational History   Occupation: Probation officer  Tobacco Use   Smoking status: Former    Types: Cigarettes    Quit date: 1987    Years since quitting: 36.2   Smokeless tobacco: Never  Vaping Use   Vaping Use: Never used  Substance and Sexual Activity   Alcohol use: Not Currently   Drug use: Never   Sexual activity: Not Currently    Comment: 1st intercourse- 20, partners- 31, married  Other Topics Concern   Not on file  Social History Narrative   Lives at home with her husband.   Right-handed.   1-2 cups caffeine per day.   Social Determinants of Health   Financial Resource Strain: Not on file  Food Insecurity: Not on file  Transportation Needs: Not on file  Physical Activity: Not on file  Stress: Not on file  Social Connections: Not on file  Intimate Partner Violence: Not on file    Outpatient Medications Prior to Visit  Medication Sig Dispense Refill   Ascorbic Acid (VITAMIN C PO) Take 2 capsules by mouth daily. Take 2 capsules daily     atorvastatin (LIPITOR) 10 MG tablet Take 1 tablet (10 mg total) by mouth daily for 15 days. 90 tablet 3   b complex vitamins tablet Take 1 tablet by mouth daily.     Coenzyme Q10 (CO Q 10 PO) Take 1 capsule by mouth daily.     Multiple Vitamin (MULTIVITAMIN) capsule Take 1 capsule by mouth daily.     Omega-3 Fatty Acids (FISH OIL) 1000 MG CAPS Take 1 capsule by mouth daily.     ondansetron (ZOFRAN ODT) 4 MG disintegrating tablet Take 1 tablet (4 mg total) by mouth every 8 (eight) hours as needed. (Patient taking differently: Take 4 mg by mouth every 8 (eight) hours as needed for nausea or vomiting.) 20 tablet 6   VITAMIN D PO Take 2 tablets by mouth daily.     No  facility-administered medications prior to visit.    Allergies  Allergen Reactions   Tape     Surgical/breaks down skin    ROS Review of Systems  Constitutional:  Negative for chills, diaphoresis, fatigue, fever and unexpected weight change.  HENT: Negative.    Eyes:  Negative for photophobia and visual disturbance.  Respiratory: Negative.    Cardiovascular: Negative.   Gastrointestinal:  Negative for abdominal pain, constipation, nausea and vomiting.  Genitourinary: Negative.   Musculoskeletal:  Negative for gait problem and joint swelling.  Neurological:  Negative for speech difficulty and weakness.  Psychiatric/Behavioral: Negative.       Objective:    Physical Exam Vitals and nursing note reviewed.  Constitutional:      General: She is not in acute distress.    Appearance: Normal appearance. She is not ill-appearing, toxic-appearing or diaphoretic.  HENT:     Head: Normocephalic and atraumatic.     Right Ear: External ear normal.     Left Ear: External ear normal.     Mouth/Throat:     Mouth: Mucous membranes are moist.     Pharynx: Oropharynx is clear. No oropharyngeal exudate or posterior oropharyngeal erythema.  Eyes:     General: No scleral icterus.       Right eye: No discharge.        Left eye: No discharge.     Extraocular Movements: Extraocular movements intact.     Conjunctiva/sclera: Conjunctivae normal.     Pupils: Pupils are equal, round, and reactive to light.  Cardiovascular:     Rate and Rhythm: Normal rate and regular rhythm.  Pulmonary:     Effort: Pulmonary effort is normal.     Breath sounds: Normal breath sounds.  Abdominal:     General: Bowel sounds are normal. There is no distension.     Palpations: Abdomen is soft. There is no mass.     Tenderness: There is no abdominal tenderness. There is no guarding or rebound.     Hernia: No hernia is present.  Musculoskeletal:     Cervical back: No rigidity or tenderness.  Lymphadenopathy:      Cervical: No cervical adenopathy.  Skin:    General: Skin is warm and dry.  Neurological:     Mental Status: She is alert and oriented to person, place, and time.  Psychiatric:        Mood  and Affect: Mood normal.        Behavior: Behavior normal.    BP 122/70 (BP Location: Left Arm, Patient Position: Sitting, Cuff Size: Normal)    Pulse 61    Temp 97.7 F (36.5 C) (Temporal)    Ht 5\' 6"  (1.676 m)    Wt 185 lb (83.9 kg)    SpO2 98%    BMI 29.86 kg/m  Wt Readings from Last 3 Encounters:  07/24/21 185 lb (83.9 kg)  07/11/21 195 lb 5.2 oz (88.6 kg)  02/20/21 190 lb (86.2 kg)     There are no preventive care reminders to display for this patient.  There are no preventive care reminders to display for this patient.  Lab Results  Component Value Date   TSH 3.114 07/12/2021   Lab Results  Component Value Date   WBC 5.3 07/14/2021   HGB 13.2 07/14/2021   HCT 39.6 07/14/2021   MCV 91.5 07/14/2021   PLT 229 07/14/2021   Lab Results  Component Value Date   NA 138 07/14/2021   K 3.5 07/14/2021   CO2 27 07/14/2021   GLUCOSE 91 07/14/2021   BUN 14 07/14/2021   CREATININE 0.64 07/14/2021   BILITOT 0.4 07/12/2021   ALKPHOS 83 07/12/2021   AST 43 (H) 07/12/2021   ALT 71 (H) 07/12/2021   PROT 6.4 (L) 07/12/2021   ALBUMIN 3.5 07/12/2021   CALCIUM 8.9 07/14/2021   ANIONGAP 8 07/14/2021   GFR 94.22 02/10/2019   Lab Results  Component Value Date   CHOL 165 07/12/2021   Lab Results  Component Value Date   HDL 45 07/12/2021   Lab Results  Component Value Date   LDLCALC 98 07/12/2021   Lab Results  Component Value Date   TRIG 111 07/12/2021   Lab Results  Component Value Date   CHOLHDL 3.7 07/12/2021   No results found for: HGBA1C    Assessment & Plan:   Problem List Items Addressed This Visit       Other   Elevated LFTs   Relevant Orders   Hepatic function panel   Elevated glucose   Relevant Orders   Basic metabolic panel   Hemoglobin A1c   Hospital  discharge follow-up - Primary   Relevant Orders   Basic metabolic panel   CBC    No orders of the defined types were placed in this encounter.   Follow-up: Return in about 5 weeks (around 08/28/2021), or May use MiraLAX or Colace as needed for constipation. May gradually advance diet.Libby Maw, MD

## 2021-08-18 ENCOUNTER — Other Ambulatory Visit: Payer: Self-pay | Admitting: Obstetrics & Gynecology

## 2021-08-18 DIAGNOSIS — Z1231 Encounter for screening mammogram for malignant neoplasm of breast: Secondary | ICD-10-CM

## 2021-08-25 ENCOUNTER — Ambulatory Visit
Admission: RE | Admit: 2021-08-25 | Discharge: 2021-08-25 | Disposition: A | Discharge status: Home / Self Care | Payer: Federal, State, Local not specified - PPO | Source: Ambulatory Visit | Attending: Obstetrics & Gynecology | Admitting: Obstetrics & Gynecology

## 2021-08-25 DIAGNOSIS — Z1231 Encounter for screening mammogram for malignant neoplasm of breast: Secondary | ICD-10-CM

## 2021-08-29 ENCOUNTER — Encounter: Payer: Self-pay | Admitting: Family Medicine

## 2021-08-29 ENCOUNTER — Ambulatory Visit: Payer: Federal, State, Local not specified - PPO | Admitting: Family Medicine

## 2021-08-29 VITALS — BP 118/68 | HR 50 | Temp 97.4°F | Ht 66.0 in | Wt 179.8 lb

## 2021-08-29 DIAGNOSIS — K565 Intestinal adhesions [bands], unspecified as to partial versus complete obstruction: Secondary | ICD-10-CM | POA: Insufficient documentation

## 2021-08-29 DIAGNOSIS — K5900 Constipation, unspecified: Secondary | ICD-10-CM

## 2021-08-29 DIAGNOSIS — R1032 Left lower quadrant pain: Secondary | ICD-10-CM | POA: Diagnosis not present

## 2021-08-29 LAB — CBC
HCT: 40.5 % (ref 36.0–46.0)
Hemoglobin: 13.7 g/dL (ref 12.0–15.0)
MCHC: 33.7 g/dL (ref 30.0–36.0)
MCV: 90.5 fl (ref 78.0–100.0)
Platelets: 266 10*3/uL (ref 150.0–400.0)
RBC: 4.47 Mil/uL (ref 3.87–5.11)
RDW: 14 % (ref 11.5–15.5)
WBC: 4.8 10*3/uL (ref 4.0–10.5)

## 2021-08-29 LAB — BASIC METABOLIC PANEL
BUN: 13 mg/dL (ref 6–23)
CO2: 28 mEq/L (ref 19–32)
Calcium: 9.8 mg/dL (ref 8.4–10.5)
Chloride: 105 mEq/L (ref 96–112)
Creatinine, Ser: 0.68 mg/dL (ref 0.40–1.20)
GFR: 92.35 mL/min (ref >60.00)
Glucose, Bld: 88 mg/dL (ref 70–99)
Potassium: 4.2 mEq/L (ref 3.5–5.1)
Sodium: 139 mEq/L (ref 135–145)

## 2021-08-29 NOTE — Progress Notes (Signed)
? ?Established Patient Office Visit ? ?Subjective:  ?Patient ID: Heather Villegas, female    DOB: 10-07-1955  Age: 66 y.o. MRN: [id number] ? ?CC:  ?Chief Complaint  ?Patient presents with  ? Follow-up  ?  5 week follow up on partial bowel obstruction c/o lower left side pain x 3 weeks. Patient fasting.   ? ? ?HPI ?Heather Villegas presents for abdominal pain seems to be most pronounced in the left lower quadrant.  She denies fevers chills nausea or vomiting hematochezia or melena.  She is using Colace once weekly for constipation but otherwise stools daily on her own.  Advised not to use fiber for constipation because it would bulk her stools and make it possibly more difficult for her to pass a bowel movement with her known history of adhesions.  She gets 12,000 steps daily between her job and her own exercise routine.  She does have a history of diverticulosis.  No history of diverticulitis.  There are parts defects in the lumbar spine.  She denies low back pain.  Occasional pain in her left anterior thigh. ? ?Past Medical History:  ?Diagnosis Date  ? Deafness in right ear   ? Heart murmur   ? Hyperlipidemia   ? diet  controlled  ? Meniere disease   ? Post-operative nausea and vomiting   ? Vertigo   ? To ED Friday night 9-10- was given Meclazine - improved   ? ? ?Past Surgical History:  ?Procedure Laterality Date  ? ABDOMINAL HYSTERECTOMY    ? 2 attempts/ unable to remove uterus/ had a lot bleeding until ablation was done  ? ABLATION    ? bowel reconstruction  2003  ? had adhesions/ removed part of small intestine  ? BREAST BIOPSY Left 2013  ? left/ precancerous lesion  ? CATARACT EXTRACTION    ? left eye  ? KIDNEY STONE SURGERY  2018  ? lithotripsy  ? TONSILLECTOMY    ? TUBAL LIGATION    ? ureter reconnection    ? URETERAL REIMPLANTION    ? ? ?Family History  ?Problem Relation Age of Onset  ? Lung cancer Mother   ? Lung cancer Father   ? Thrombosis Father   ? Drug abuse Brother   ? Colon cancer Neg Hx   ? Colon polyps Neg  Hx   ? Esophageal cancer Neg Hx   ? Rectal cancer Neg Hx   ? Stomach cancer Neg Hx   ? ? ?Social History  ? ?Socioeconomic History  ? Marital status: Married  ?  Spouse name: Not on file  ? Number of children: 3  ? Years of education: 25  ? Highest education level: High school graduate  ?Occupational History  ? Occupation: sterile processing tech  ?Tobacco Use  ? Smoking status: Former  ?  Types: Cigarettes  ?  Quit date: 1  ?  Years since quitting: 36.2  ? Smokeless tobacco: Never  ?Vaping Use  ? Vaping Use: Never used  ?Substance and Sexual Activity  ? Alcohol use: Not Currently  ? Drug use: Never  ? Sexual activity: Not Currently  ?  Comment: 1st intercourse- 20, partners- 4, married  ?Other Topics Concern  ? Not on file  ?Social History Narrative  ? Lives at home with her husband.  ? Right-handed.  ? 1-2 cups caffeine per day.  ? ?Social Determinants of Health  ? ?Financial Resource Strain: Not on file  ?Food Insecurity: Not on file  ?Transportation Needs:  Not on file  ?Physical Activity: Not on file  ?Stress: Not on file  ?Social Connections: Not on file  ?Intimate Partner Violence: Not on file  ? ? ?Outpatient Medications Prior to Visit  ?Medication Sig Dispense Refill  ? Ascorbic Acid (VITAMIN C PO) Take 2 capsules by mouth daily. Take 2 capsules daily    ? atorvastatin (LIPITOR) 10 MG tablet Take 1 tablet (10 mg total) by mouth daily for 15 days. 90 tablet 3  ? b complex vitamins tablet Take 1 tablet by mouth daily.    ? Coenzyme Q10 (CO Q 10 PO) Take 1 capsule by mouth daily.    ? Multiple Vitamin (MULTIVITAMIN) capsule Take 1 capsule by mouth daily.    ? Omega-3 Fatty Acids (FISH OIL) 1000 MG CAPS Take 1 capsule by mouth daily.    ? ondansetron (ZOFRAN ODT) 4 MG disintegrating tablet Take 1 tablet (4 mg total) by mouth every 8 (eight) hours as needed. (Patient taking differently: Take 4 mg by mouth every 8 (eight) hours as needed for nausea or vomiting.) 20 tablet 6  ? VITAMIN D PO Take 2 tablets by  mouth daily.    ? ?No facility-administered medications prior to visit.  ? ? ?Allergies  ?Allergen Reactions  ? Tape   ?  Surgical/breaks down skin  ? ? ?ROS ?Review of Systems  ?Constitutional:  Negative for diaphoresis, fatigue, fever and unexpected weight change.  ?Respiratory: Negative.    ?Cardiovascular: Negative.   ?Gastrointestinal:  Positive for abdominal pain and constipation. Negative for abdominal distention, anal bleeding, blood in stool, diarrhea, nausea, rectal pain and vomiting.  ?Genitourinary: Negative.   ?Musculoskeletal:  Negative for back pain and myalgias.  ?Neurological:  Negative for speech difficulty, weakness and numbness.  ? ?  ?Objective:  ?  ?Physical Exam ?Vitals and nursing note reviewed.  ?Constitutional:   ?   General: She is not in acute distress. ?   Appearance: Normal appearance. She is not ill-appearing, toxic-appearing or diaphoretic.  ?HENT:  ?   Head: Normocephalic and atraumatic.  ?   Right Ear: External ear normal.  ?   Left Ear: External ear normal.  ?   Mouth/Throat:  ?   Mouth: Mucous membranes are moist.  ?   Pharynx: Oropharynx is clear. No oropharyngeal exudate or posterior oropharyngeal erythema.  ?Eyes:  ?   Extraocular Movements: Extraocular movements intact.  ?   Conjunctiva/sclera: Conjunctivae normal.  ?   Pupils: Pupils are equal, round, and reactive to light.  ?Cardiovascular:  ?   Rate and Rhythm: Normal rate and regular rhythm.  ?Pulmonary:  ?   Effort: Pulmonary effort is normal.  ?   Breath sounds: Normal breath sounds.  ?Abdominal:  ?   General: Abdomen is flat. Bowel sounds are normal. There is no distension.  ?   Palpations: Abdomen is soft. There is no mass.  ?   Tenderness: There is abdominal tenderness. There is no right CVA tenderness, left CVA tenderness, guarding or rebound.  ?   Hernia: No hernia is present.  ?Musculoskeletal:  ?   Cervical back: No rigidity or tenderness.  ?   Lumbar back: No tenderness or bony tenderness. Normal range of motion.  Negative right straight leg raise test and negative left straight leg raise test.  ?Lymphadenopathy:  ?   Cervical: No cervical adenopathy.  ?Skin: ?   General: Skin is dry.  ?Neurological:  ?   Mental Status: She is alert and oriented to person,  place, and time.  ?Psychiatric:     ?   Mood and Affect: Mood normal.     ?   Behavior: Behavior normal.  ? ? ?BP 118/68 (BP Location: Left Arm, Patient Position: Sitting, Cuff Size: Normal)   Pulse (!) 50   Temp (!) 97.4 ?F (36.3 ?C) (Temporal)   Ht 5\' 6"  (1.676 m)   Wt 179 lb 12.8 oz (81.6 kg)   SpO2 99%   BMI 29.02 kg/m?  ?Wt Readings from Last 3 Encounters:  ?08/29/21 179 lb 12.8 oz (81.6 kg)  ?07/24/21 185 lb (83.9 kg)  ?07/11/21 195 lb 5.2 oz (88.6 kg)  ? ? ? ?There are no preventive care reminders to display for this patient. ? ?There are no preventive care reminders to display for this patient. ? ?Lab Results  ?Component Value Date  ? TSH 3.114 07/12/2021  ? ?Lab Results  ?Component Value Date  ? WBC 5.3 07/24/2021  ? HGB 13.8 07/24/2021  ? HCT 40.7 07/24/2021  ? MCV 90.0 07/24/2021  ? PLT 367.0 07/24/2021  ? ?Lab Results  ?Component Value Date  ? NA 139 07/24/2021  ? K 4.2 07/24/2021  ? CO2 31 07/24/2021  ? GLUCOSE 85 07/24/2021  ? BUN 14 07/24/2021  ? CREATININE 0.71 07/24/2021  ? BILITOT 0.5 07/24/2021  ? ALKPHOS 115 07/24/2021  ? AST 19 07/24/2021  ? ALT 29 07/24/2021  ? PROT 7.7 07/24/2021  ? ALBUMIN 4.3 07/24/2021  ? CALCIUM 10.0 07/24/2021  ? ANIONGAP 8 07/14/2021  ? GFR 90.22 07/24/2021  ? ?Lab Results  ?Component Value Date  ? CHOL 165 07/12/2021  ? ?Lab Results  ?Component Value Date  ? HDL 45 07/12/2021  ? ?Lab Results  ?Component Value Date  ? LDLCALC 98 07/12/2021  ? ?Lab Results  ?Component Value Date  ? TRIG 111 07/12/2021  ? ?Lab Results  ?Component Value Date  ? CHOLHDL 3.7 07/12/2021  ? ?Lab Results  ?Component Value Date  ? HGBA1C 5.7 07/24/2021  ? ? ?  ?Assessment & Plan:  ? ?Problem List Items Addressed This Visit   ? ?  ? Digestive  ?  Intermittent small bowel obstruction due to adhesions [facility](HCC)  ? Relevant Orders  ? CBC  ? Basic metabolic panel  ?  ? Other  ? Left lower quadrant abdominal pain - Primary  ? Relevant Orders  ? CBC  ? Basic metab

## 2021-12-26 ENCOUNTER — Ambulatory Visit: Payer: Federal, State, Local not specified - PPO | Admitting: Cardiology

## 2022-01-17 ENCOUNTER — Encounter (HOSPITAL_BASED_OUTPATIENT_CLINIC_OR_DEPARTMENT_OTHER): Payer: Self-pay | Admitting: Emergency Medicine

## 2022-01-17 ENCOUNTER — Other Ambulatory Visit: Payer: Self-pay

## 2022-01-17 ENCOUNTER — Telehealth (HOSPITAL_BASED_OUTPATIENT_CLINIC_OR_DEPARTMENT_OTHER): Payer: Self-pay | Admitting: Emergency Medicine

## 2022-01-17 ENCOUNTER — Emergency Department (HOSPITAL_BASED_OUTPATIENT_CLINIC_OR_DEPARTMENT_OTHER)
Admission: EM | Admit: 2022-01-17 | Discharge: 2022-01-17 | Disposition: A | Discharge status: Home / Self Care | Payer: Federal, State, Local not specified - PPO | Attending: Emergency Medicine | Admitting: Emergency Medicine

## 2022-01-17 ENCOUNTER — Emergency Department (HOSPITAL_BASED_OUTPATIENT_CLINIC_OR_DEPARTMENT_OTHER): Payer: Federal, State, Local not specified - PPO

## 2022-01-17 DIAGNOSIS — S86002A Unspecified injury of left Achilles tendon, initial encounter: Secondary | ICD-10-CM | POA: Diagnosis not present

## 2022-01-17 DIAGNOSIS — M7732 Calcaneal spur, left foot: Secondary | ICD-10-CM | POA: Diagnosis not present

## 2022-01-17 DIAGNOSIS — X58XXXA Exposure to other specified factors, initial encounter: Secondary | ICD-10-CM | POA: Diagnosis not present

## 2022-01-17 MED ORDER — IBUPROFEN 200 MG PO TABS
600.0000 mg | ORAL_TABLET | Freq: Once | ORAL | Status: AC
  Administered 2022-01-17: 600 mg via ORAL
  Filled 2022-01-17: qty 1

## 2022-01-17 MED ORDER — IBUPROFEN 600 MG PO TABS: 600.0000 mg | ORAL_TABLET | Freq: Four times a day (QID) | ORAL | 0 refills | Status: DC | PRN

## 2022-01-17 MED ORDER — OXYCODONE HCL 5 MG PO TABS
5.0000 mg | ORAL_TABLET | Freq: Once | ORAL | Status: AC
  Administered 2022-01-17: 5 mg via ORAL
  Filled 2022-01-17: qty 1

## 2022-01-17 MED ORDER — MORPHINE SULFATE 15 MG PO TABS: 7.5000 mg | ORAL_TABLET | ORAL | 0 refills | Status: DC | PRN

## 2022-01-17 MED ORDER — OXYCODONE HCL 5 MG PO TABS: 5.0000 mg | ORAL_TABLET | Freq: Four times a day (QID) | ORAL | 0 refills | Status: DC | PRN

## 2022-01-17 NOTE — ED Notes (Signed)
ED Provider at bedside. 

## 2022-01-17 NOTE — ED Provider Notes (Signed)
[facility] EMERGENCY DEPARTMENT Provider Note   CSN: [id number] Arrival date & time: 01/17/22  9381     History  Chief Complaint  Patient presents with   Ankle Pain    Heather Villegas is a 66 y.o. female.  Patient is a 66 year old female presenting for left lower extremity pain.  Patient points to the region of her Achilles tendon and states she has been having pain over the past several days.  States she works on her feet all day.  Denies any sitting injury or acute onset.  Admits to gradual onset of worsening pain severity and mass that develops at the bottom near her heel that resolves with foot elevation and rest.  Denies any sensation deficits.  Difficulty with range of motion due to pain however is able to flex and plantarflex the foot.  The history is provided by the patient. No language interpreter was used.  Ankle Pain Associated symptoms: no fever        Home Medications Prior to Admission medications   Medication Sig Start Date End Date Taking? Authorizing Provider  ibuprofen (ADVIL) 600 MG tablet Take 1 tablet (600 mg total) by mouth every 6 (six) hours as needed for mild pain. 01/17/22  Yes Edwin Dada P, DO  oxyCODONE (ROXICODONE) 5 MG immediate release tablet Take 1 tablet (5 mg total) by mouth every 6 (six) hours as needed for up to 3 days for severe pain. 01/17/22 01/20/22 Yes Edwin Dada P, DO  Ascorbic Acid (VITAMIN C PO) Take 2 capsules by mouth daily. Take 2 capsules daily    [provider]  atorvastatin (LIPITOR) 10 MG tablet Take 1 tablet (10 mg total) by mouth daily for 15 days. 12/26/20 08/29/21  Georgeanna Lea, MD  b complex vitamins tablet Take 1 tablet by mouth daily.    [provider]  Coenzyme Q10 (CO Q 10 PO) Take 1 capsule by mouth daily.    [provider]  Multiple Vitamin (MULTIVITAMIN) capsule Take 1 capsule by mouth daily.    [provider]  Omega-3 Fatty Acids (FISH OIL) 1000 MG CAPS Take 1  capsule by mouth daily.    [provider]  ondansetron (ZOFRAN ODT) 4 MG disintegrating tablet Take 1 tablet (4 mg total) by mouth every 8 (eight) hours as needed. Patient taking differently: Take 4 mg by mouth every 8 (eight) hours as needed for nausea or vomiting. 12/22/19   Nche, Bonna Gains, NP  VITAMIN D PO Take 2 tablets by mouth daily.    [provider]      Allergies    Tape    Review of Systems   Review of Systems  Constitutional:  Negative for chills and fever.  Skin:  Negative for color change and wound.  Neurological:  Negative for weakness and numbness.    Physical Exam Updated Vital Signs BP (!) 147/55 (BP Location: Right Arm)   Pulse (!) 57   Temp 97.9 F (36.6 C) (Oral)   Resp 20   Ht 5\' 6"  (1.676 m)   Wt 81.6 kg   SpO2 96%   BMI 29.05 kg/m  Physical Exam Vitals and nursing note reviewed.  Constitutional:      Appearance: Normal appearance.  HENT:     Head: Normocephalic and atraumatic.  Cardiovascular:     Rate and Rhythm: Normal rate and regular rhythm.  Pulmonary:     Effort: Pulmonary effort is normal.     Breath sounds: Normal  breath sounds.  Musculoskeletal:     Right ankle:     Right Achilles Tendon: Normal.     Left ankle:     Left Achilles Tendon: Tenderness present. No defects. Thompson's test negative.     Right foot: Normal pulse.     Left foot: Normal pulse.  Skin:    Capillary Refill: Capillary refill takes less than 2 seconds.  Neurological:     Mental Status: She is alert and oriented to person, place, and time.     GCS: GCS eye subscore is 4. GCS verbal subscore is 5. GCS motor subscore is 6.     Sensory: Sensation is intact.     Motor: Motor function is intact.     ED Results / Procedures / Treatments   Labs (all labs ordered are listed, but only abnormal results are displayed) Labs Reviewed - No data to display  EKG None  Radiology DG Ankle Complete Left  Result Date: 01/17/2022 CLINICAL DATA:   non-traumatic L ankle pain EXAM: LEFT ANKLE COMPLETE - 3+ VIEW COMPARISON:  None Available. FINDINGS: There is no evidence of fracture, dislocation, or joint effusion. There is no evidence of arthropathy or other focal bone abnormality except for a large plantar bony spur in the calcaneus. There are linear calcifications seen along the insertion of the tendo-Achilles. Soft tissues are unremarkable. IMPRESSION: Large bony spur at the plantar calcaneus. Linear calcifications along the insertion of the tendo-Achilles. Electronically Signed   By: Marjo Bicker M.D.   On: 01/17/2022 07:38    Procedures Procedures    Medications Ordered in ED Medications  oxyCODONE (Oxy IR/ROXICODONE) immediate release tablet 5 mg (has no administration in time range)  ibuprofen (ADVIL) tablet 600 mg (has no administration in time range)    ED Course/ Medical Decision Making/ A&P                           Medical Decision Making  50:39 AM 66 year old female presenting for tenderness at the Achilles tendon.  Physical exam demonstrates tenderness palpation at the insertion point to the heel.  No swelling appreciated.  No erythema or external wounds.  I contacted our ultrasound technician at this time and she has not trained for MSK.  Will treat pain and recommend close follow-up with orthopedic surgery for further evaluation.  Short leg posterior splint in plantar flexion and crutches provided.  Patient in no distress and overall condition improved here in the ED. Detailed discussions were had with the patient regarding current findings, and need for close f/u with PCP or on call doctor. The patient has been instructed to return immediately if the symptoms worsen in any way for re-evaluation. Patient verbalized understanding and is in agreement with current care plan. All questions answered prior to discharge.        Final Clinical Impression(s) / ED Diagnoses Final diagnoses:  Injury of left Achilles tendon,  initial encounter    Rx / DC Orders ED Discharge Orders          Ordered    ibuprofen (ADVIL) 600 MG tablet  Every 6 hours PRN        01/17/22 0742    oxyCODONE (ROXICODONE) 5 MG immediate release tablet  Every 6 hours PRN        01/17/22 0742              Franne Forts, DO 01/17/22 [phone]

## 2022-01-17 NOTE — Telephone Encounter (Signed)
Patient had narcotics sent to this pharmacy unfortunately the narcotic that was sent is not in stock.  We will attempt to try different narcotic

## 2022-01-17 NOTE — ED Triage Notes (Signed)
Patient arrived via POV c/o left ankle pain of and on for several months. Patient states pain has gotten progressively worse since Friday. Patient states unable to bear weight on it today. Patient states 8/10 pain. Patient is AO x 4, VS WDL, unable to bear weight.

## 2022-01-17 NOTE — Discharge Instructions (Signed)
Our ultrasound technician is not trained for musculoskeletal disorders including the Achilles.  Please call make appointment with Dr. Magnus Ivan with orthopedic surgery for evaluations for Achilles tendon injury.  Take Motrin 600 mg every 6 hours as needed for mild pain and oxycodone every 6 hours as needed for severe pain.  Use crutches and keep foot in splint to prevent further injury.  No weightbearing activity on the foot.

## 2022-01-19 ENCOUNTER — Ambulatory Visit: Payer: Federal, State, Local not specified - PPO | Admitting: Orthopaedic Surgery

## 2022-01-19 DIAGNOSIS — M7662 Achilles tendinitis, left leg: Secondary | ICD-10-CM | POA: Diagnosis not present

## 2022-01-19 MED ORDER — PREDNISONE 10 MG (21) PO TBPK: ORAL_TABLET | ORAL | 3 refills | Status: DC

## 2022-01-19 NOTE — Progress Notes (Signed)
Office Visit Note   Patient: Heather Villegas           Date of Birth: July 04, 1955           MRN: [id number] Visit Date: 01/19/2022              Requested by: Mliss Sax, MD [address] [place],  [place] [address] PCP: Mliss Sax, MD   Assessment & Plan: Visit Diagnoses:  1. Left Achilles tendinitis     Plan: Impression is left Achilles tendinitis.  Condition reviewed with the patient today.  Will immobilize in a cam boot with 2 heel lifts which she may gradually remove as symptoms improved.  Also recommend Voltaren gel.  We will try some prednisone.  Briefly discussed nitroglycerin patches.  Follow-up in 6 weeks.  May need to consider PT if symptoms persist.  Follow-Up Instructions: Return in about 6 weeks (around 03/02/2022).   Orders:  No orders of the defined types were placed in this encounter.  Meds ordered this encounter  Medications   predniSONE (STERAPRED UNI-PAK 21 TAB) 10 MG (21) TBPK tablet    Sig: Take as directed    Dispense:  21 tablet    Refill:  3      Procedures: No procedures performed   Clinical Data: No additional findings.   Subjective: Chief Complaint  Patient presents with   Left Ankle - Pain    HPI [name] is a 66 year old female follow-up from the ED for acute severe left heel pain.  Was seen at [facility] on the 30th.  Noticed swelling and lump over the Achilles tendon over the weekend when the pain started.  Denies any injuries.  She does work at the [employer] as a Scientist, clinical (histocompatibility and immunogenetics) and is required to do a lot of standing and walking.  She has had some chronic Achilles tendinitis problems in the past.  Review of Systems  Constitutional: Negative.   HENT: Negative.    Eyes: Negative.   Respiratory: Negative.    Cardiovascular: Negative.   Endocrine: Negative.   Musculoskeletal: Negative.   Neurological: Negative.   Hematological: Negative.   Psychiatric/Behavioral: Negative.    All other systems reviewed and  are negative.    Objective: Vital Signs: There were no vitals taken for this visit.  Physical Exam Vitals and nursing note reviewed.  Constitutional:      Appearance: She is well-developed.  HENT:     Head: Atraumatic.     Nose: Nose normal.  Eyes:     Extraocular Movements: Extraocular movements intact.  Cardiovascular:     Pulses: Normal pulses.  Pulmonary:     Effort: Pulmonary effort is normal.  Abdominal:     Palpations: Abdomen is soft.  Musculoskeletal:     Cervical back: Neck supple.  Skin:    General: Skin is warm.     Capillary Refill: Capillary refill takes less than 2 seconds.  Neurological:     Mental Status: She is alert. Mental status is at baseline.  Psychiatric:        Behavior: Behavior normal.        Thought Content: Thought content normal.        Judgment: Judgment normal.     Ortho Exam Examination of the left ankle shows Achilles tendon that is in continuity.  There is some enlargement and swelling of the mid substance of the Achilles.  Thompson's test is normal.  Sensation normal.  Capillary refill is normal.  Strong pulses.  Good strength with ankle plantarflexion. Specialty Comments:  No specialty comments available.  Imaging: No results found.   PMFS History: Patient Active Problem List   Diagnosis Date Noted   Left Achilles tendinitis 01/19/2022   Left lower quadrant abdominal pain 08/29/2021   Intermittent small bowel obstruction due to adhesions (HCC) 08/29/2021   Constipation 08/29/2021   Elevated glucose 07/24/2021   Hospital discharge follow-up 07/24/2021   SBO (small bowel obstruction) (HCC) 07/11/2021   Elevated LFTs 07/11/2021   Hydronephrosis 07/11/2021   Vertigo 12/19/2020   Post-operative nausea and vomiting 12/19/2020   Deafness in right ear 12/19/2020   Vestibular hypofunction of both ears 06/03/2020   Mitral regurgitation mild based on the echo done in 2020 07/15/2019   Asymmetric SNHL (sensorineural hearing loss)  05/07/2019   Tinnitus of both ears 05/07/2019   Meniere's disease of left ear 02/10/2019   Elevated LDL cholesterol level 02/10/2019   Dyslipidemia 01/05/2019   Vitamin D deficiency 12/05/2018   DOE (dyspnea on exertion) 12/04/2018   Heart murmur 12/04/2018   Healthcare maintenance 12/04/2018   Past Medical History:  Diagnosis Date   Deafness in right ear    Heart murmur    Hyperlipidemia    diet  controlled   Meniere disease    Post-operative nausea and vomiting    Vertigo    To ED Friday night 9-10- was given Meclazine - improved     Family History  Problem Relation Age of Onset   Lung cancer Mother    Lung cancer Father    Thrombosis Father    Drug abuse Brother    Colon cancer Neg Hx    Colon polyps Neg Hx    Esophageal cancer Neg Hx    Rectal cancer Neg Hx    Stomach cancer Neg Hx     Past Surgical History:  Procedure Laterality Date   ABDOMINAL HYSTERECTOMY     2 attempts/ unable to remove uterus/ had a lot bleeding until ablation was done   ABLATION     bowel reconstruction  2003   had adhesions/ removed part of small intestine   BREAST BIOPSY Left 2013   left/ precancerous lesion   CATARACT EXTRACTION     left eye   KIDNEY STONE SURGERY  2018   lithotripsy   TONSILLECTOMY     TUBAL LIGATION     ureter reconnection     URETERAL REIMPLANTION     Social History   Occupational History   Occupation: Conservation officer, historic buildings  Tobacco Use   Smoking status: Former    Types: Cigarettes    Quit date: 1987    Years since quitting: 36.6   Smokeless tobacco: Never  Vaping Use   Vaping Use: Never used  Substance and Sexual Activity   Alcohol use: Not Currently   Drug use: Never   Sexual activity: Not Currently    Comment: 1st intercourse- 20, partners- 4, married

## 2022-01-23 ENCOUNTER — Ambulatory Visit: Payer: Federal, State, Local not specified - PPO | Admitting: Cardiology

## 2022-02-06 ENCOUNTER — Other Ambulatory Visit: Payer: Self-pay | Admitting: Cardiology

## 2022-02-06 MED ORDER — ATORVASTATIN CALCIUM 10 MG PO TABS: 10.0000 mg | ORAL_TABLET | Freq: Every day | ORAL | 0 refills | Status: DC

## 2022-02-20 ENCOUNTER — Telehealth: Payer: Self-pay | Admitting: Cardiology

## 2022-02-20 ENCOUNTER — Telehealth: Payer: Self-pay

## 2022-02-20 MED ORDER — ATORVASTATIN CALCIUM 10 MG PO TABS: 10.0000 mg | ORAL_TABLET | Freq: Every day | ORAL | 3 refills | Status: DC

## 2022-02-20 NOTE — Telephone Encounter (Signed)
Ref Atorvastatin 10mg #90 ref x 3 

## 2022-02-20 NOTE — Telephone Encounter (Signed)
*  STAT* If patient is at the pharmacy, call can be transferred to refill team.   1. Which medications need to be refilled? (please list name of each medication and dose if known) atorvastatin (LIPITOR) 10 MG tablet  2. Which pharmacy/location (including street and city if local pharmacy) is medication to be sent to? [facility] [address] [place], [place]  3. Do they need a 30 day or 90 day supply? 90   Patient has appt for 12/5

## 2022-03-01 ENCOUNTER — Encounter: Payer: Self-pay | Admitting: Orthopaedic Surgery

## 2022-03-01 ENCOUNTER — Ambulatory Visit: Payer: Federal, State, Local not specified - PPO | Admitting: Orthopaedic Surgery

## 2022-03-01 DIAGNOSIS — M7662 Achilles tendinitis, left leg: Secondary | ICD-10-CM

## 2022-03-01 MED ORDER — TRAMADOL HCL 50 MG PO TABS: 50.0000 mg | ORAL_TABLET | Freq: Two times a day (BID) | ORAL | 2 refills | Status: DC | PRN

## 2022-03-01 NOTE — Progress Notes (Signed)
Office Visit Note   Patient: Heather Villegas           Date of Birth: 02/22/1956           MRN: [id number] Visit Date: 03/01/2022              Requested by: Libby Maw, MD [address] [place],  [place] [address] PCP: Libby Maw, MD   Assessment & Plan: Visit Diagnoses:  1. Left Achilles tendinitis     Plan: Impression is improving left heel Achilles tendinitis.  At this point, she has gone ahead and removed the 2 heel lifts in the boot.  She will continue wearing the boot for a little while longer.  I would like to go ahead and start her in outpatient physical therapy for range of motion and modalities.  Referral has been made.  She will follow-up with [place] in 6 weeks for recheck.  If her symptoms have dissipated by that point she will call and cancel her appointment.  Call with concerns or questions in the meantime.  Follow-Up Instructions: Return in about 6 weeks (around 04/12/2022).   Orders:  Orders Placed This Encounter  Procedures   Ambulatory referral to Physical Therapy   Meds ordered this encounter  Medications   traMADol (ULTRAM) 50 MG tablet    Sig: Take 1 tablet (50 mg total) by mouth every 12 (twelve) hours as needed.    Dispense:  30 tablet    Refill:  2      Procedures: No procedures performed   Clinical Data: No additional findings.   Subjective: Chief Complaint  Patient presents with   Left Ankle - Follow-up    Left achilles tendonitis    HPI patient is a pleasant 66 year old female who comes in today for follow-up of her left heel.  She has been dealing with left heel Achilles tendinitis for several weeks now.  She was placed in a cam walker.  I asked about 6 weeks ago.  She has noticed relief with wearing the boot but notes that her symptoms return when she is out of the boot.  She is unable to take NSAIDs due to renal issues.  She has been using topical Voltaren without significant relief.  Review of Systems as  detailed in HPI.  All others reviewed and are negative.   Objective: Vital Signs: There were no vitals taken for this visit.  Physical Exam well-developed well-nourished female no acute distress.  Alert and oriented x3.  Ortho Exam left heel reveals moderate tenderness to the mid Achilles.  She is not very limited with dorsiflexion.  She is neurovascular intact distally.  Specialty Comments:  No specialty comments available.  Imaging: No new imaging   PMFS History: Patient Active Problem List   Diagnosis Date Noted   Left Achilles tendinitis 01/19/2022   Left lower quadrant abdominal pain 08/29/2021   Intermittent small bowel obstruction due to adhesions ([place]) 08/29/2021   Constipation 08/29/2021   Elevated glucose 07/24/2021   Hospital discharge follow-up 07/24/2021   SBO (small bowel obstruction) ([place]) 07/11/2021   Elevated LFTs 07/11/2021   Hydronephrosis 07/11/2021   Vertigo 12/19/2020   Post-operative nausea and vomiting 12/19/2020   Deafness in right ear 12/19/2020   Vestibular hypofunction of both ears 06/03/2020   Mitral regurgitation mild based on the echo done in 2020 07/15/2019   Asymmetric SNHL (sensorineural hearing loss) 05/07/2019   Tinnitus of both ears 05/07/2019   Meniere's disease of left ear  02/10/2019   Elevated LDL cholesterol level 02/10/2019   Dyslipidemia 01/05/2019   Vitamin D deficiency 12/05/2018   DOE (dyspnea on exertion) 12/04/2018   Heart murmur 12/04/2018   Healthcare maintenance 12/04/2018   Past Medical History:  Diagnosis Date   Deafness in right ear    Heart murmur    Hyperlipidemia    diet  controlled   Meniere disease    Post-operative nausea and vomiting    Vertigo    To ED Friday night 9-10- was given Meclazine - improved     Family History  Problem Relation Age of Onset   Lung cancer Mother    Lung cancer Father    Thrombosis Father    Drug abuse Brother    Colon cancer Neg Hx    Colon polyps Neg Hx    Esophageal  cancer Neg Hx    Rectal cancer Neg Hx    Stomach cancer Neg Hx     Past Surgical History:  Procedure Laterality Date   ABDOMINAL HYSTERECTOMY     2 attempts/ unable to remove uterus/ had a lot bleeding until ablation was done   ABLATION     bowel reconstruction  2003   had adhesions/ removed part of small intestine   BREAST BIOPSY Left 2013   left/ precancerous lesion   CATARACT EXTRACTION     left eye   KIDNEY STONE SURGERY  2018   lithotripsy   TONSILLECTOMY     TUBAL LIGATION     ureter reconnection     URETERAL REIMPLANTION     Social History   Occupational History   Occupation: Probation officer  Tobacco Use   Smoking status: Former    Types: Cigarettes    Quit date: 1987    Years since quitting: 36.8   Smokeless tobacco: Never  Vaping Use   Vaping Use: Never used  Substance and Sexual Activity   Alcohol use: Not Currently   Drug use: Never   Sexual activity: Not Currently    Comment: 1st intercourse- 20, partners- 41, married

## 2022-03-02 ENCOUNTER — Ambulatory Visit: Payer: Federal, State, Local not specified - PPO | Admitting: Orthopaedic Surgery

## 2022-03-14 ENCOUNTER — Ambulatory Visit: Payer: Federal, State, Local not specified - PPO | Attending: Physician Assistant | Admitting: Physical Therapy

## 2022-03-14 ENCOUNTER — Other Ambulatory Visit: Payer: Self-pay

## 2022-03-14 DIAGNOSIS — M25672 Stiffness of left ankle, not elsewhere classified: Secondary | ICD-10-CM | POA: Insufficient documentation

## 2022-03-14 DIAGNOSIS — R2689 Other abnormalities of gait and mobility: Secondary | ICD-10-CM | POA: Diagnosis not present

## 2022-03-14 DIAGNOSIS — R600 Localized edema: Secondary | ICD-10-CM | POA: Diagnosis not present

## 2022-03-14 DIAGNOSIS — M7662 Achilles tendinitis, left leg: Secondary | ICD-10-CM | POA: Insufficient documentation

## 2022-03-14 DIAGNOSIS — M25572 Pain in left ankle and joints of left foot: Secondary | ICD-10-CM | POA: Insufficient documentation

## 2022-03-14 NOTE — Therapy (Signed)
OUTPATIENT PHYSICAL THERAPY LOWER EXTREMITY EVALUATION   Patient Name: Heather Villegas MRN: [id number] DOB:05-13-1956, 66 y.o., female Today's Date: 03/14/2022   PT End of Session - 03/14/22 0850     Visit Number 1    Number of Visits 8    Date for PT Re-Evaluation 05/09/22    Authorization Type BCBS    PT Start Time [phone]    PT Stop Time 0930    PT Time Calculation (min) 43 min    Activity Tolerance Patient tolerated treatment well    Behavior During Therapy [facility] for tasks assessed/performed             Past Medical History:  Diagnosis Date   Deafness in right ear    Heart murmur    Hyperlipidemia    diet  controlled   Meniere disease    Post-operative nausea and vomiting    Vertigo    To ED Friday night 9-10- was given Meclazine - improved    Past Surgical History:  Procedure Laterality Date   ABDOMINAL HYSTERECTOMY     2 attempts/ unable to remove uterus/ had a lot bleeding until ablation was done   ABLATION     bowel reconstruction  2003   had adhesions/ removed part of small intestine   BREAST BIOPSY Left 2013   left/ precancerous lesion   CATARACT EXTRACTION     left eye   KIDNEY STONE SURGERY  2018   lithotripsy   TONSILLECTOMY     TUBAL LIGATION     ureter reconnection     URETERAL REIMPLANTION     Patient Active Problem List   Diagnosis Date Noted   Left Achilles tendinitis 01/19/2022   Left lower quadrant abdominal pain 08/29/2021   Intermittent small bowel obstruction due to adhesions ([place]) 08/29/2021   Constipation 08/29/2021   Elevated glucose 07/24/2021   Hospital discharge follow-up 07/24/2021   SBO (small bowel obstruction) ([place]) 07/11/2021   Elevated LFTs 07/11/2021   Hydronephrosis 07/11/2021   Vertigo 12/19/2020   Post-operative nausea and vomiting 12/19/2020   Deafness in right ear 12/19/2020   Vestibular hypofunction of both ears 06/03/2020   Mitral regurgitation mild based on the echo done in 2020 07/15/2019   Asymmetric SNHL  (sensorineural hearing loss) 05/07/2019   Tinnitus of both ears 05/07/2019   Meniere's disease of left ear 02/10/2019   Elevated LDL cholesterol level 02/10/2019   Dyslipidemia 01/05/2019   Vitamin D deficiency 12/05/2018   DOE (dyspnea on exertion) 12/04/2018   Heart murmur 12/04/2018   Healthcare maintenance 12/04/2018    PCP: Abelino Derrick  REFERRING PROVIDER: Aundra Dubin, PA-C   REFERRING DIAG: [phone] (ICD-10-CM) - Left Achilles tendinitis   THERAPY DIAG:  Pain in left ankle and joints of left foot - Plan: PT plan of care cert/re-cert  Stiffness of left ankle, not elsewhere classified - Plan: PT plan of care cert/re-cert  Other abnormalities of gait and mobility - Plan: PT plan of care cert/re-cert  Localized edema - Plan: PT plan of care cert/re-cert  Rationale for Evaluation and Treatment Rehabilitation  ONSET DATE: ~6 weeks ago  SUBJECTIVE:   SUBJECTIVE STATEMENT: Pt reports she was working a lot of over time -- stands and walks on cement floors. Noticed ~6 weeks ago she was getting pain with walking and then the pain wasn't going away with sitting/relaxing. It is normally painful first thing in the morning and then it goes away. Pt reports she was NWB on crutches and now  in a boot. Pt states she was in a heel lift for 5 weeks and has slowly weaned off lift. Pt reports it doesn't bother her walking in the boot. Wraps it at home after work in ACE bandage but without wearing footwear at home she can feel the pain. Pt notes that the "lump" on the back of her foot it will swell. Pt is to remain in boot for 6 weeks (mid November).  PERTINENT HISTORY: Meniere's on R ear (reports baseline decreased balance)   PAIN:  Are you having pain? Yes: NPRS scale: 0 currently, at worst without boot 5/10 Pain location: Back of L heel, mid achilles Pain description: Sharp, normally aching/sore/stiff Aggravating factors: Standing on toes, not wearing boot and walking Relieving  factors: Boot  PRECAUTIONS: None  WEIGHT BEARING RESTRICTIONS: No  FALLS:  Has patient fallen in last 6 months? No  LIVING ENVIRONMENT: Lives with: lives with their spouse Lives in: House/apartment Stairs: No Has following equipment at home: None  OCCUPATION: sterile processing tech   PLOF: Independent  PATIENT GOALS: Improve pain with walking; return to walking dog (usually 2-3 miles)   OBJECTIVE:   DIAGNOSTIC FINDINGS: Heel spur  PATIENT SURVEYS:  FOTO 65; predicted 79  COGNITION: Overall cognitive status: Within functional limits for tasks assessed     SENSATION: WFL  EDEMA:  Mild edema noted along Achilles insertion on calcaneus  MUSCLE LENGTH: See dorsiflexion ROM for Achilles  POSTURE: No Significant postural limitations  PALPATION: TTP L Achilles, gastroc/soleus  LOWER EXTREMITY MMT:  MMT Right eval Left eval  Hip flexion    Hip extension    Hip abduction    Hip adduction    Hip internal rotation    Hip external rotation    Knee flexion    Knee extension    Ankle dorsiflexion 5 5  Ankle plantarflexion 5 4+  Ankle inversion 5 5  Ankle eversion 5 5   (Blank rows = not tested)  LOWER EXTREMITY ROM:  AROM Right eval Left eval  Hip flexion    Hip extension    Hip abduction    Hip adduction    Hip internal rotation    Hip external rotation    Knee flexion    Knee extension    Ankle dorsiflexion 10 5  Ankle plantarflexion 42 35  Ankle inversion 35 25  Ankle eversion 25 30   (Blank rows = not tested)  LOWER EXTREMITY SPECIAL TESTS:  Did not assess  FUNCTIONAL TESTS:  R 1/2 tandem: 15 sec, L 1/2 tandem: 14 sec DL heel raise W26 -- L heel raises less and painful   GAIT: Distance walked: 150' Assistive device utilized:  boot Level of assistance: Complete Independence Comments: Mildly antalgic gait and wide BOS due to boot   TODAY'S TREATMENT                                                                           DATE:03/14/22 THEREX Standing Gastroc stretch 2x30 sec Soleus stretch 2x30 sec  SELF CARE Massage stick calf   MODALITIES Ionto 4 hour patch dexamethasone  PATIENT EDUCATION:  Education details: Exam findings, POC, and initial HEP/self massage Person educated: Patient Education method: Explanation, Demonstration, and  Handouts Education comprehension: verbalized understanding, returned demonstration, and needs further education  HOME EXERCISE PROGRAM: Access Code: [email or web address] URL: [email or web address] Date: 03/14/2022 Prepared by: Vernon Prey April Kirstie Peri  Exercises - Gastroc Stretch on Wall  - 1 x daily - 7 x weekly - 2 sets - 30 sec hold - Soleus Stretch on Wall  - 1 x daily - 7 x weekly - 2 sets - 30 sec hold  Patient Education - [place] Patient Instructions  ASSESSMENT:  CLINICAL IMPRESSION: Patient is a 66 y.o. F who was seen today for physical therapy evaluation and treatment for Achilles tendonitis. Assessment significant for decreased ankle ROM, pain, and decreased balance affecting functional mobility, t/fs, and amb for home and work tasks. Performed trial of ionto to decrease inflammation and pain (discussed with pt precautions to ionto and to watch her skin due to history of allergy to ibuprofen and surgical tape). Pt would benefit from PT to address these issues for return to PLOF.   OBJECTIVE IMPAIRMENTS: Abnormal gait, decreased activity tolerance, decreased balance, decreased mobility, difficulty walking, decreased ROM, decreased strength, increased edema, increased fascial restrictions, increased muscle spasms, improper body mechanics, postural dysfunction, and pain.   ACTIVITY LIMITATIONS: standing, transfers, locomotion level, and caring for others  PARTICIPATION LIMITATIONS: cleaning, shopping, community activity, and occupation  PERSONAL FACTORS: Age, Past/current experiences, Profession, and Time since onset of injury/illness/exacerbation are  also affecting patient's functional outcome.   REHAB POTENTIAL: Good  CLINICAL DECISION MAKING: Stable/uncomplicated  EVALUATION COMPLEXITY: Low   GOALS: Goals reviewed with patient? Yes  SHORT TERM GOALS: Target date: 04/11/2022  Pt will be ind with initial HEP Baseline: Goal status: INITIAL  2.  Pt will demo L = R ankle ROM Baseline:  Goal status: INITIAL    LONG TERM GOALS: Target date: 05/09/2022   Pt will be ind with continuing HEP and exercises for community wellness Baseline:  Goal status: INITIAL  2.  Pt will be able to perform single leg heel raise x10 without pain on L Baseline:  Goal status: INITIAL  3.  Pt will be able to walk with her dog 2-3 miles without pain Baseline:  Goal status: INITIAL  4.  Pt will be able to demo improved foot/ankle stability by maintaining 1/2 tandem stance at least 20 sec Baseline:  Goal status: INITIAL  5.  Pt will demo no tenderness to palpation of gastroc/soleus/Achilles Baseline:  Goal status: INITIAL  6.   Pt will demo improved FOTO score to >/= 79 Baseline:  Goal status: INITIAL   PLAN:  PT FREQUENCY: 1x/week  PT DURATION: 8 weeks  PLANNED INTERVENTIONS: Therapeutic exercises, Therapeutic activity, Neuromuscular re-education, Balance training, Gait training, Patient/Family education, Self Care, Joint mobilization, Stair training, Aquatic Therapy, Dry Needling, Electrical stimulation, Cryotherapy, Moist heat, Taping, Ultrasound, Ionotophoresis 4mg /ml Dexamethasone, Manual therapy, and Re-evaluation  PLAN FOR NEXT SESSION: Assess response to initial HEP. Continue manual work as indicated. Discuss TPDN and perform as indicated. Continue to stretch/strengthen ankle/foot. Initiate toe yoga   Yevonne Yokum April Ma L [place], PT, DPT 03/14/2022, 3:00 PM

## 2022-03-20 ENCOUNTER — Encounter: Payer: Self-pay | Admitting: Physical Therapy

## 2022-03-20 ENCOUNTER — Ambulatory Visit: Payer: Federal, State, Local not specified - PPO | Admitting: Physical Therapy

## 2022-03-20 DIAGNOSIS — M25572 Pain in left ankle and joints of left foot: Secondary | ICD-10-CM

## 2022-03-20 DIAGNOSIS — R600 Localized edema: Secondary | ICD-10-CM | POA: Diagnosis not present

## 2022-03-20 DIAGNOSIS — R2689 Other abnormalities of gait and mobility: Secondary | ICD-10-CM | POA: Diagnosis not present

## 2022-03-20 DIAGNOSIS — M25672 Stiffness of left ankle, not elsewhere classified: Secondary | ICD-10-CM

## 2022-03-20 DIAGNOSIS — M7662 Achilles tendinitis, left leg: Secondary | ICD-10-CM | POA: Diagnosis not present

## 2022-03-20 NOTE — Therapy (Signed)
OUTPATIENT PHYSICAL THERAPY LOWER EXTREMITY EVALUATION   Patient Name: Heather Villegas MRN: [id number] DOB:08-24-1955, 66 y.o., female Today's Date: 03/20/2022   PT End of Session - 03/20/22 0851     Visit Number 2    Number of Visits 8    Date for PT Re-Evaluation 05/09/22    Authorization Type BCBS    PT Start Time [phone]    PT Stop Time 0930    PT Time Calculation (min) 39 min    Activity Tolerance Patient tolerated treatment well    Behavior During Therapy [facility] for tasks assessed/performed             Past Medical History:  Diagnosis Date   Deafness in right ear    Heart murmur    Hyperlipidemia    diet  controlled   Meniere disease    Post-operative nausea and vomiting    Vertigo    To ED Friday night 9-10- was given Meclazine - improved    Past Surgical History:  Procedure Laterality Date   ABDOMINAL HYSTERECTOMY     2 attempts/ unable to remove uterus/ had a lot bleeding until ablation was done   ABLATION     bowel reconstruction  2003   had adhesions/ removed part of small intestine   BREAST BIOPSY Left 2013   left/ precancerous lesion   CATARACT EXTRACTION     left eye   KIDNEY STONE SURGERY  2018   lithotripsy   TONSILLECTOMY     TUBAL LIGATION     ureter reconnection     URETERAL REIMPLANTION     Patient Active Problem List   Diagnosis Date Noted   Left Achilles tendinitis 01/19/2022   Left lower quadrant abdominal pain 08/29/2021   Intermittent small bowel obstruction due to adhesions (HCC) 08/29/2021   Constipation 08/29/2021   Elevated glucose 07/24/2021   Hospital discharge follow-up 07/24/2021   SBO (small bowel obstruction) (HCC) 07/11/2021   Elevated LFTs 07/11/2021   Hydronephrosis 07/11/2021   Vertigo 12/19/2020   Post-operative nausea and vomiting 12/19/2020   Deafness in right ear 12/19/2020   Vestibular hypofunction of both ears 06/03/2020   Mitral regurgitation mild based on the echo done in 2020 07/15/2019   Asymmetric SNHL  (sensorineural hearing loss) 05/07/2019   Tinnitus of both ears 05/07/2019   Meniere's disease of left ear 02/10/2019   Elevated LDL cholesterol level 02/10/2019   Dyslipidemia 01/05/2019   Vitamin D deficiency 12/05/2018   DOE (dyspnea on exertion) 12/04/2018   Heart murmur 12/04/2018   Healthcare maintenance 12/04/2018    PCP: Nadene Rubins  REFERRING PROVIDER: Cristie Hem, PA-C   REFERRING DIAG: [phone] (ICD-10-CM) - Left Achilles tendinitis   THERAPY DIAG:  Pain in left ankle and joints of left foot  Stiffness of left ankle, not elsewhere classified  Other abnormalities of gait and mobility  Localized edema  Rationale for Evaluation and Treatment Rehabilitation  ONSET DATE: ~6 weeks ago  SUBJECTIVE:   SUBJECTIVE STATEMENT: Pt states she has been doing her stretches and self manual work. Does note less stiffness.   PERTINENT HISTORY: Meniere's on R ear (reports baseline decreased balance)   PAIN:  Are you having pain? Yes: NPRS scale: 0 currently, at worst without boot 5/10 Pain location: Back of L heel, mid achilles Pain description: Sharp, normally aching/sore/stiff Aggravating factors: Standing on toes, not wearing boot and walking Relieving factors: Boot  PRECAUTIONS: None  WEIGHT BEARING RESTRICTIONS: No  FALLS:  Has patient fallen in  last 6 months? No  LIVING ENVIRONMENT: Lives with: lives with their spouse Lives in: House/apartment Stairs: No Has following equipment at home: None  OCCUPATION: sterile processing tech   PLOF: Independent  PATIENT GOALS: Improve pain with walking; return to walking dog (usually 2-3 miles)   OBJECTIVE:   DIAGNOSTIC FINDINGS: Heel spur  PATIENT SURVEYS:  FOTO 65; predicted 91  COGNITION: Overall cognitive status: Within functional limits for tasks assessed    EDEMA:  Mild edema noted along Achilles insertion on calcaneus  MUSCLE LENGTH: See dorsiflexion ROM for Achilles  LOWER EXTREMITY  MMT:  MMT Right eval Left eval  Hip flexion    Hip extension    Hip abduction    Hip adduction    Hip internal rotation    Hip external rotation    Knee flexion    Knee extension    Ankle dorsiflexion 5 5  Ankle plantarflexion 5 4+  Ankle inversion 5 5  Ankle eversion 5 5   (Blank rows = not tested)  LOWER EXTREMITY ROM:  AROM Right eval Left eval  Hip flexion    Hip extension    Hip abduction    Hip adduction    Hip internal rotation    Hip external rotation    Knee flexion    Knee extension    Ankle dorsiflexion 10 5  Ankle plantarflexion 42 35  Ankle inversion 35 25  Ankle eversion 25 30   (Blank rows = not tested)  LOWER EXTREMITY SPECIAL TESTS:  Did not assess  FUNCTIONAL TESTS:  R 1/2 tandem: 15 sec, L 1/2 tandem: 14 sec DL heel raise x10 -- L heel raises less and painful   GAIT: Distance walked: 150' Assistive device utilized:  boot Level of assistance: Complete [place] Comments: Mildly antalgic gait and wide BOS due to boot   TODAY'S TREATMENT                                                                          DATE:03/20/22 THEREX Nustep L5 x 5 min UEs/LEs  Sitting Toe yoga 2x10 Toe splaying 2x10 Arch lifting 2x10 Heel raises x10, with 10# KB x10 3 way ankle green TB 2x10 each  Standing Gastroc stretch 2x30 sec Soleus stretch 2x30 sec Eccentric heel raise 3x10 each feet forward, toes in, toes out    TREATMENT                                                                          DATE:03/14/22 THEREX Standing Gastroc stretch 2x30 sec Soleus stretch 2x30 sec  SELF CARE Massage stick calf   MODALITIES Ionto 4 hour patch dexamethasone  PATIENT EDUCATION:  Education details: Exam findings, POC, and initial HEP/self massage Person educated: Patient Education method: Explanation, Demonstration, and Handouts Education comprehension: verbalized understanding, returned demonstration, and needs further education  HOME  EXERCISE PROGRAM: Access Code: [email or web address] URL: [email or web address] Date: 03/20/2022 Prepared by: Estill Bamberg April Thurnell Garbe  Exercises -  Gastroc Stretch on Wall  - 1 x daily - 7 x weekly - 2 sets - 30 sec hold - Soleus Stretch on Wall  - 1 x daily - 7 x weekly - 2 sets - 30 sec hold - Seated Ankle Inversion with Resistance  - 1 x daily - 7 x weekly - 2 sets - 10 reps - Toe Yoga - Alternating Great Toe and Lesser Toe Extension  - 1 x daily - 7 x weekly - 2 sets - 10 reps - Seated Arch Lifts  - 1 x daily - 7 x weekly - 2 sets - 10 reps - Seated Heel Toe Raises  - 1 x daily - 7 x weekly - 3 sets - 10 reps - Standing Heel Raise with Support  - 1 x daily - 7 x weekly - 3 sets - 10 reps  ASSESSMENT:  CLINICAL IMPRESSION: Treatment session focused on initiating strengthening exercises for foot and ankle. Tolerated well. Pt with decreased edema behind calcaneus.   OBJECTIVE IMPAIRMENTS: Abnormal gait, decreased activity tolerance, decreased balance, decreased mobility, difficulty walking, decreased ROM, decreased strength, increased edema, increased fascial restrictions, increased muscle spasms, improper body mechanics, postural dysfunction, and pain.   ACTIVITY LIMITATIONS: standing, transfers, locomotion level, and caring for others  PARTICIPATION LIMITATIONS: cleaning, shopping, community activity, and occupation  PERSONAL FACTORS: Age, Past/current experiences, Profession, and Time since onset of injury/illness/exacerbation are also affecting patient's functional outcome.   REHAB POTENTIAL: Good  CLINICAL DECISION MAKING: Stable/uncomplicated  EVALUATION COMPLEXITY: Low   GOALS: Goals reviewed with patient? Yes  SHORT TERM GOALS: Target date: 04/11/2022  Pt will be ind with initial HEP Baseline: Goal status: INITIAL  2.  Pt will demo L = R ankle ROM Baseline:  Goal status: INITIAL    LONG TERM GOALS: Target date: 05/09/2022   Pt will be ind with continuing  HEP and exercises for community wellness Baseline:  Goal status: INITIAL  2.  Pt will be able to perform single leg heel raise x10 without pain on L Baseline:  Goal status: INITIAL  3.  Pt will be able to walk with her dog 2-3 miles without pain Baseline:  Goal status: INITIAL  4.  Pt will be able to demo improved foot/ankle stability by maintaining 1/2 tandem stance at least 20 sec Baseline:  Goal status: INITIAL  5.  Pt will demo no tenderness to palpation of gastroc/soleus/Achilles Baseline:  Goal status: INITIAL  6.   Pt will demo improved FOTO score to >/= 79 Baseline:  Goal status: INITIAL   PLAN:  PT FREQUENCY: 1x/week  PT DURATION: 8 weeks  PLANNED INTERVENTIONS: Therapeutic exercises, Therapeutic activity, Neuromuscular re-education, Balance training, Gait training, Patient/Family education, Self Care, Joint mobilization, Stair training, Aquatic Therapy, Dry Needling, Electrical stimulation, Cryotherapy, Moist heat, Taping, Ultrasound, Ionotophoresis 4mg /ml Dexamethasone, Manual therapy, and Re-evaluation  PLAN FOR NEXT SESSION: Assess response to initial HEP. Continue manual work as indicated. Discuss TPDN and perform as indicated. Continue to stretch/strengthen ankle/foot.   Jessieca Rhem April Ma L Imanii Gosdin, PT, DPT 03/20/2022, 8:52 AM

## 2022-03-27 ENCOUNTER — Ambulatory Visit
Payer: Federal, State, Local not specified - PPO | Attending: Orthopaedic Surgery | Admitting: Rehabilitative and Restorative Service Providers"

## 2022-03-27 ENCOUNTER — Encounter: Payer: Self-pay | Admitting: Rehabilitative and Restorative Service Providers"

## 2022-03-27 DIAGNOSIS — M25672 Stiffness of left ankle, not elsewhere classified: Secondary | ICD-10-CM | POA: Diagnosis not present

## 2022-03-27 DIAGNOSIS — R600 Localized edema: Secondary | ICD-10-CM | POA: Diagnosis not present

## 2022-03-27 DIAGNOSIS — R2689 Other abnormalities of gait and mobility: Secondary | ICD-10-CM | POA: Diagnosis not present

## 2022-03-27 DIAGNOSIS — M25572 Pain in left ankle and joints of left foot: Secondary | ICD-10-CM | POA: Insufficient documentation

## 2022-03-27 NOTE — Therapy (Signed)
OUTPATIENT PHYSICAL THERAPY LOWER EXTREMITY TREATMENT   Patient Name: Heather Villegas MRN: [id number] DOB:07/11/1955, 67 y.o., female Today's Date: 03/27/2022   PT End of Session - 03/27/22 0931     Visit Number 3    Number of Visits 8    Date for PT Re-Evaluation 05/09/22    Authorization Type BCBS    PT Start Time 0933    PT Stop Time 1014    PT Time Calculation (min) 41 min    Activity Tolerance Patient tolerated treatment well    Behavior During Therapy [facility] for tasks assessed/performed              Past Medical History:  Diagnosis Date   Deafness in right ear    Heart murmur    Hyperlipidemia    diet  controlled   Meniere disease    Post-operative nausea and vomiting    Vertigo    To ED Friday night 9-10- was given Meclazine - improved    Past Surgical History:  Procedure Laterality Date   ABDOMINAL HYSTERECTOMY     2 attempts/ unable to remove uterus/ had a lot bleeding until ablation was done   ABLATION     bowel reconstruction  2003   had adhesions/ removed part of small intestine   BREAST BIOPSY Left 2013   left/ precancerous lesion   CATARACT EXTRACTION     left eye   KIDNEY STONE SURGERY  2018   lithotripsy   TONSILLECTOMY     TUBAL LIGATION     ureter reconnection     URETERAL REIMPLANTION     Patient Active Problem List   Diagnosis Date Noted   Left Achilles tendinitis 01/19/2022   Left lower quadrant abdominal pain 08/29/2021   Intermittent small bowel obstruction due to adhesions (HCC) 08/29/2021   Constipation 08/29/2021   Elevated glucose 07/24/2021   Hospital discharge follow-up 07/24/2021   SBO (small bowel obstruction) (HCC) 07/11/2021   Elevated LFTs 07/11/2021   Hydronephrosis 07/11/2021   Vertigo 12/19/2020   Post-operative nausea and vomiting 12/19/2020   Deafness in right ear 12/19/2020   Vestibular hypofunction of both ears 06/03/2020   Mitral regurgitation mild based on the echo done in 2020 07/15/2019   Asymmetric SNHL  (sensorineural hearing loss) 05/07/2019   Tinnitus of both ears 05/07/2019   Meniere's disease of left ear 02/10/2019   Elevated LDL cholesterol level 02/10/2019   Dyslipidemia 01/05/2019   Vitamin D deficiency 12/05/2018   DOE (dyspnea on exertion) 12/04/2018   Heart murmur 12/04/2018   Healthcare maintenance 12/04/2018    PCP: Nadene Rubins  REFERRING PROVIDER: Cristie Hem, PA-C   REFERRING DIAG: [phone] (ICD-10-CM) - Left Achilles tendinitis   THERAPY DIAG:  Pain in left ankle and joints of left foot  Stiffness of left ankle, not elsewhere classified  Other abnormalities of gait and mobility  Localized edema  Rationale for Evaluation and Treatment Rehabilitation  ONSET DATE: ~6 weeks ago  SUBJECTIVE:   SUBJECTIVE STATEMENT: The patient reports she is trying to ween out of the boot and is using a lift in her shoe. She is going to travel to [place], [place] to see her new grandson later this week. Her L ankle feels tight today. "I feel fine, except in the morning when it is stiff."  PERTINENT HISTORY: Meniere's on R ear (reports baseline decreased balance)   PAIN:  Are you having pain? Yes: NPRS scale: 0 currently, at worst without boot 5/10 Pain location: Back of  L heel, mid achilles Pain description: Sharp, normally aching/sore/stiff Aggravating factors: Standing on toes, not wearing boot and walking Relieving factors: Boot  PRECAUTIONS: Deaf in R ear, 80% intact L ear  WEIGHT BEARING RESTRICTIONS: No  FALLS:  Has patient fallen in last 6 months? No  PATIENT GOALS: Improve pain with walking; return to walking dog (usually 2-3 miles)  OBJECTIVE:  From initial eval LOWER EXTREMITY MMT:  MMT Right eval Left eval  Hip flexion    Hip extension    Hip abduction    Hip adduction    Hip internal rotation    Hip external rotation    Knee flexion    Knee extension    Ankle dorsiflexion 5 5  Ankle plantarflexion 5 4+  Ankle inversion 5 5  Ankle  eversion 5 5   (Blank rows = not tested)  LOWER EXTREMITY ROM:  AROM Right eval Left eval  Hip flexion    Hip extension    Hip abduction    Hip adduction    Hip internal rotation    Hip external rotation    Knee flexion    Knee extension    Ankle dorsiflexion 10 5  Ankle plantarflexion 42 35  Ankle inversion 35 25  Ankle eversion 25 30   (Blank rows = not tested)  FUNCTIONAL TESTS:  R 1/2 tandem: 15 sec, L 1/2 tandem: 14 sec DL heel raise Z85 -- L heel raises less and painful  [facility] Adult PT Treatment:                                                DATE: 03/27/22 Therapeutic Exercise: Nu-step level 5 Ues/Les x 5 minutes Seated Great toe extension stretch Toe yoga lifting bilat great toes, then bilat lateral toes x 12 reps Standing  Heel raises with toes in, toes out, toes neutral, and ball between heels x 12 reps in each position Single leg stance Marching x 10 feet x 4 reps Backwards walking x 10 feet x 4 reps Sidestepping toes in and then toes out Rocking for ankle mobilization with one leg on 12" surface x 10 reps  Manual Therapy: Deep soft tissue work with palpable tenderness and muscle trigger point in L soleous and gastrocs Joint mobilization to improve flexibility of foot working on mobility of metatarsals, subtalar mobility, and P/ROM into overpressure DF Neuromuscular re-ed: Rocking in stride for balance control and for ankle control x 10 reps with SBA due to imbalance  TODAY'S TREATMENT                                                                          DATE:03/20/22 THEREX Nustep L5 x 5 min UEs/LEs  Sitting Toe yoga 2x10 Toe splaying 2x10 Arch lifting 2x10 Heel raises x10, with 10# KB x10 3 way ankle green TB 2x10 each  Standing Gastroc stretch 2x30 sec Soleus stretch 2x30 sec Eccentric heel raise 3x10 each feet forward, toes in, toes out  TREATMENT  DATE:03/14/22 THEREX Standing Gastroc stretch 2x30 sec Soleus stretch 2x30 sec  SELF CARE Massage stick calf   MODALITIES Ionto 4 hour patch dexamethasone  PATIENT EDUCATION:  Education details: HEP discussion, dry needling handout Person educated: Patient Education method: Explanation, Demonstration, and Handouts Education comprehension: verbalized understanding, returned demonstration, and needs further education  HOME EXERCISE PROGRAM: Access Code: [email or web address] URL: [email or web address] Date: 03/20/2022 Prepared by: Estill Bamberg April Thurnell Garbe  Exercises - Gastroc Stretch on Wall  - 1 x daily - 7 x weekly - 2 sets - 30 sec hold - Soleus Stretch on Wall  - 1 x daily - 7 x weekly - 2 sets - 30 sec hold - Seated Ankle Inversion with Resistance  - 1 x daily - 7 x weekly - 2 sets - 10 reps - Toe Yoga - Alternating Great Toe and Lesser Toe Extension  - 1 x daily - 7 x weekly - 2 sets - 10 reps - Seated Arch Lifts  - 1 x daily - 7 x weekly - 2 sets - 10 reps - Seated Heel Toe Raises  - 1 x daily - 7 x weekly - 3 sets - 10 reps - Standing Heel Raise with Support  - 1 x daily - 7 x weekly - 3 sets - 10 reps *Gave handout for dry needling  ASSESSMENT:  CLINICAL IMPRESSION: The patient is tolerating increased strengthening and load with L ankle.  She is using ice to decrease pain after work shifts and wearing boot 4 hours on/4 hours off at work.  She will be traveling to meet grandson this week-- recommended keep current HEP and we will progress upon her return.   OBJECTIVE IMPAIRMENTS: Abnormal gait, decreased activity tolerance, decreased balance, decreased mobility, difficulty walking, decreased ROM, decreased strength, increased edema, increased fascial restrictions, increased muscle spasms, improper body mechanics, postural dysfunction, and pain.   ACTIVITY LIMITATIONS: standing, transfers, locomotion level, and caring for others  PARTICIPATION LIMITATIONS: cleaning,  shopping, community activity, and occupation  PERSONAL FACTORS: Age, Past/current experiences, Profession, and Time since onset of injury/illness/exacerbation are also affecting patient's functional outcome.   REHAB POTENTIAL: Good  CLINICAL DECISION MAKING: Stable/uncomplicated  EVALUATION COMPLEXITY: Low   GOALS: Goals reviewed with patient? Yes  SHORT TERM GOALS: Target date: 04/11/2022  Pt will be ind with initial HEP Baseline: Goal status:IN PROGRESS  2.  Pt will demo L = R ankle ROM Baseline:  Goal status: IN PROGRESS  LONG TERM GOALS: Target date: 05/09/2022   Pt will be ind with continuing HEP and exercises for community wellness Baseline:  Goal status:IN PROGRESS  2.  Pt will be able to perform single leg heel raise x10 without pain on L Baseline:  Goal status: IN PROGRESS  3.  Pt will be able to walk with her dog 2-3 miles without pain Baseline:  Goal status: IN PROGRESS  4.  Pt will be able to demo improved foot/ankle stability by maintaining 1/2 tandem stance at least 20 sec Baseline:  Goal status: IN PROGRESS  5.  Pt will demo no tenderness to palpation of gastroc/soleus/Achilles Baseline:  Goal status:IN PROGRESS  6.   Pt will demo improved FOTO score to >/= 79 Baseline:  Goal status: IN PROGRESS  PLAN:  PT FREQUENCY: 1x/week  PT DURATION: 8 weeks  PLANNED INTERVENTIONS: Therapeutic exercises, Therapeutic activity, Neuromuscular re-education, Balance training, Gait training, Patient/Family education, Self Care, Joint mobilization, Stair training, Aquatic Therapy, Dry Needling, Electrical stimulation, Cryotherapy, Moist heat, Taping, Ultrasound, Ionotophoresis 4mg /ml Dexamethasone,  Manual therapy, and Re-evaluation  PLAN FOR NEXT SESSION: Assess response to initial HEP. Continue manual work as indicated. Discuss TPDN and perform as indicated. Continue to stretch/strengthen ankle/foot. (Has 11/16 off and would be willing to try DN at next session  if still has trigger point in soleous)   Skye Rodarte, PT 03/27/2022, 9:31 AM

## 2022-04-05 ENCOUNTER — Encounter: Payer: Self-pay | Admitting: Physical Therapy

## 2022-04-05 ENCOUNTER — Ambulatory Visit: Payer: Federal, State, Local not specified - PPO | Admitting: Physical Therapy

## 2022-04-05 DIAGNOSIS — M25672 Stiffness of left ankle, not elsewhere classified: Secondary | ICD-10-CM

## 2022-04-05 DIAGNOSIS — R2689 Other abnormalities of gait and mobility: Secondary | ICD-10-CM

## 2022-04-05 DIAGNOSIS — M25572 Pain in left ankle and joints of left foot: Secondary | ICD-10-CM | POA: Diagnosis not present

## 2022-04-05 DIAGNOSIS — R600 Localized edema: Secondary | ICD-10-CM

## 2022-04-05 NOTE — Therapy (Signed)
OUTPATIENT PHYSICAL THERAPY LOWER EXTREMITY TREATMENT   Patient Name: Heather Villegas MRN: [id number] DOB:1956/03/28, 66 y.o., female Today's Date: 04/05/2022   PT End of Session - 04/05/22 1100     Visit Number 4    Number of Visits 8    Date for PT Re-Evaluation 05/09/22    Authorization Type BCBS    PT Start Time 1100    PT Stop Time 1140    PT Time Calculation (min) 40 min    Activity Tolerance Patient tolerated treatment well    Behavior During Therapy [facility] for tasks assessed/performed              Past Medical History:  Diagnosis Date   Deafness in right ear    Heart murmur    Hyperlipidemia    diet  controlled   Meniere disease    Post-operative nausea and vomiting    Vertigo    To ED Friday night 9-10- was given Meclazine - improved    Past Surgical History:  Procedure Laterality Date   ABDOMINAL HYSTERECTOMY     2 attempts/ unable to remove uterus/ had a lot bleeding until ablation was done   ABLATION     bowel reconstruction  2003   had adhesions/ removed part of small intestine   BREAST BIOPSY Left 2013   left/ precancerous lesion   CATARACT EXTRACTION     left eye   KIDNEY STONE SURGERY  2018   lithotripsy   TONSILLECTOMY     TUBAL LIGATION     ureter reconnection     URETERAL REIMPLANTION     Patient Active Problem List   Diagnosis Date Noted   Left Achilles tendinitis 01/19/2022   Left lower quadrant abdominal pain 08/29/2021   Intermittent small bowel obstruction due to adhesions (HCC) 08/29/2021   Constipation 08/29/2021   Elevated glucose 07/24/2021   Hospital discharge follow-up 07/24/2021   SBO (small bowel obstruction) (HCC) 07/11/2021   Elevated LFTs 07/11/2021   Hydronephrosis 07/11/2021   Vertigo 12/19/2020   Post-operative nausea and vomiting 12/19/2020   Deafness in right ear 12/19/2020   Vestibular hypofunction of both ears 06/03/2020   Mitral regurgitation mild based on the echo done in 2020 07/15/2019   Asymmetric SNHL  (sensorineural hearing loss) 05/07/2019   Tinnitus of both ears 05/07/2019   Meniere's disease of left ear 02/10/2019   Elevated LDL cholesterol level 02/10/2019   Dyslipidemia 01/05/2019   Vitamin D deficiency 12/05/2018   DOE (dyspnea on exertion) 12/04/2018   Heart murmur 12/04/2018   Healthcare maintenance 12/04/2018    PCP: Nadene Rubins  REFERRING PROVIDER: Cristie Hem, PA-C   REFERRING DIAG: [phone] (ICD-10-CM) - Left Achilles tendinitis   THERAPY DIAG:  Pain in left ankle and joints of left foot  Stiffness of left ankle, not elsewhere classified  Other abnormalities of gait and mobility  Localized edema  Rationale for Evaluation and Treatment Rehabilitation  ONSET DATE: ~6 weeks ago  SUBJECTIVE:   SUBJECTIVE STATEMENT: Pt states trip to [place] went well. Reports her heel is feeling pretty good. No pain currently. Pt used boot while getting around airport. Was able to do most of household walking without boot.   PERTINENT HISTORY: Meniere's on R ear (reports baseline decreased balance)   PAIN:  Are you having pain? Yes: NPRS scale: 0 currently, at worst without boot 5/10 Pain location: Back of L heel, mid achilles Pain description: Sharp, normally aching/sore/stiff Aggravating factors: Standing on toes, not wearing boot and  walking Relieving factors: Boot  PRECAUTIONS: Deaf in R ear, 80% intact L ear  WEIGHT BEARING RESTRICTIONS: No  FALLS:  Has patient fallen in last 6 months? No  PATIENT GOALS: Improve pain with walking; return to walking dog (usually 2-3 miles)  OBJECTIVE:  From initial eval LOWER EXTREMITY MMT:  MMT Right eval Left eval  Hip flexion    Hip extension    Hip abduction    Hip adduction    Hip internal rotation    Hip external rotation    Knee flexion    Knee extension    Ankle dorsiflexion 5 5  Ankle plantarflexion 5 4+  Ankle inversion 5 5  Ankle eversion 5 5   (Blank rows = not tested)  LOWER EXTREMITY  ROM:  AROM Right eval Left eval  Hip flexion    Hip extension    Hip abduction    Hip adduction    Hip internal rotation    Hip external rotation    Knee flexion    Knee extension    Ankle dorsiflexion 10 5  Ankle plantarflexion 42 35  Ankle inversion 35 25  Ankle eversion 25 30   (Blank rows = not tested)  FUNCTIONAL TESTS:  R 1/2 tandem: 15 sec, L 1/2 tandem: 14 sec DL heel raise E33 -- L heel raises less and painful  [facility] Adult PT Treatment:                                                DATE: 04/05/22 Therapeutic Exercise: Recumbent bike L3 x 5 min Standing Gastroc stretch 2 x 30 sec Soleus stretch 2 x 30 sec Toe yoga 2x10 Arch lifting 2x10 Tandem stance 2x30 sec Heel raises toes in, toes out, toes neutral 2x10 each on 4" step Heel raise knees flexed 2x10  Neuromuscular re-ed: Resisted walking with sports cord R&L, forwards and backwards x 10 each   [facility] Adult PT Treatment:                                                DATE: 03/27/22 Therapeutic Exercise: Nu-step level 5 Ues/Les x 5 minutes Seated Great toe extension stretch Toe yoga lifting bilat great toes, then bilat lateral toes x 12 reps Standing  Heel raises with toes in, toes out, toes neutral, and ball between heels x 12 reps in each position Single leg stance Marching x 10 feet x 4 reps Backwards walking x 10 feet x 4 reps Sidestepping toes in and then toes out Rocking for ankle mobilization with one leg on 12" surface x 10 reps  Manual Therapy: Deep soft tissue work with palpable tenderness and muscle trigger point in L soleous and gastrocs Joint mobilization to improve flexibility of foot working on mobility of metatarsals, subtalar mobility, and P/ROM into overpressure DF Neuromuscular re-ed: Rocking in stride for balance control and for ankle control x 10 reps with SBA due to imbalance  TODAY'S TREATMENT  DATE:03/20/22 THEREX Nustep L5 x 5 min UEs/LEs  Sitting Toe yoga 2x10 Toe splaying 2x10 Arch lifting 2x10 Heel raises x10, with 10# KB x10 3 way ankle green TB 2x10 each  Standing Gastroc stretch 2x30 sec Soleus stretch 2x30 sec Eccentric heel raise 3x10 each feet forward, toes in, toes out   PATIENT EDUCATION:  Education details: HEP discussion, dry needling handout Person educated: Patient Education method: Explanation, Demonstration, and Handouts Education comprehension: verbalized understanding, returned demonstration, and needs further education  HOME EXERCISE PROGRAM: Access Code: [email or web address] URL: [email or web address] Date: 03/20/2022 Prepared by: Vernon PreyGellen April Kirstie PeriMarie Chandon Lazcano  Exercises - Gastroc Stretch on Wall  - 1 x daily - 7 x weekly - 2 sets - 30 sec hold - Soleus Stretch on Wall  - 1 x daily - 7 x weekly - 2 sets - 30 sec hold - Seated Ankle Inversion with Resistance  - 1 x daily - 7 x weekly - 2 sets - 10 reps - Toe Yoga - Alternating Great Toe and Lesser Toe Extension  - 1 x daily - 7 x weekly - 2 sets - 10 reps - Seated Arch Lifts  - 1 x daily - 7 x weekly - 2 sets - 10 reps - Seated Heel Toe Raises  - 1 x daily - 7 x weekly - 3 sets - 10 reps - Standing Heel Raise with Support  - 1 x daily - 7 x weekly - 3 sets - 10 reps   ASSESSMENT:  CLINICAL IMPRESSION: Pt continues to progress well. No pain with activity. Has pretty much weaned herself completely out of the boot. Took out another layer from her heel lift. Progressed HEP to all standing. Tightness has improved. Aim to progress pt to uneven surfaces for return to community mobility if no issues.   OBJECTIVE IMPAIRMENTS: Abnormal gait, decreased activity tolerance, decreased balance, decreased mobility, difficulty walking, decreased ROM, decreased strength, increased edema, increased fascial restrictions, increased muscle spasms, improper body mechanics, postural dysfunction, and pain.    ACTIVITY LIMITATIONS: standing, transfers, locomotion level, and caring for others  PARTICIPATION LIMITATIONS: cleaning, shopping, community activity, and occupation  PERSONAL FACTORS: Age, Past/current experiences, Profession, and Time since onset of injury/illness/exacerbation are also affecting patient's functional outcome.   REHAB POTENTIAL: Good  CLINICAL DECISION MAKING: Stable/uncomplicated  EVALUATION COMPLEXITY: Low   GOALS: Goals reviewed with patient? Yes  SHORT TERM GOALS: Target date: 04/11/2022  Pt will be ind with initial HEP Baseline: Goal status:IN PROGRESS  2.  Pt will demo L = R ankle ROM Baseline:  Goal status: IN PROGRESS  LONG TERM GOALS: Target date: 05/09/2022   Pt will be ind with continuing HEP and exercises for community wellness Baseline:  Goal status:IN PROGRESS  2.  Pt will be able to perform single leg heel raise x10 without pain on L Baseline:  Goal status: IN PROGRESS  3.  Pt will be able to walk with her dog 2-3 miles without pain Baseline:  Goal status: IN PROGRESS  4.  Pt will be able to demo improved foot/ankle stability by maintaining 1/2 tandem stance at least 20 sec Baseline:  Goal status: IN PROGRESS  5.  Pt will demo no tenderness to palpation of gastroc/soleus/Achilles Baseline:  Goal status:IN PROGRESS  6.   Pt will demo improved FOTO score to >/= 79 Baseline:  Goal status: IN PROGRESS  PLAN:  PT FREQUENCY: 1x/week  PT DURATION: 8 weeks  PLANNED INTERVENTIONS: Therapeutic exercises, Therapeutic activity, Neuromuscular re-education, Balance training, Gait  training, Patient/Family education, Self Care, Joint mobilization, Stair training, Aquatic Therapy, Dry Needling, Electrical stimulation, Cryotherapy, Moist heat, Taping, Ultrasound, Ionotophoresis 4mg /ml Dexamethasone, Manual therapy, and Re-evaluation  PLAN FOR NEXT SESSION: Assess response to initial HEP. Continue manual work as indicated. Discuss TPDN and  perform as indicated. Continue to stretch/strengthen ankle/foot.    Raji Glinski April Ma L Rae Plotner, PT, DPT 04/05/2022, 11:01 AM

## 2022-04-11 ENCOUNTER — Encounter: Payer: Self-pay | Admitting: Physical Therapy

## 2022-04-11 ENCOUNTER — Ambulatory Visit: Payer: Federal, State, Local not specified - PPO | Admitting: Physical Therapy

## 2022-04-11 DIAGNOSIS — R600 Localized edema: Secondary | ICD-10-CM | POA: Diagnosis not present

## 2022-04-11 DIAGNOSIS — M25672 Stiffness of left ankle, not elsewhere classified: Secondary | ICD-10-CM | POA: Diagnosis not present

## 2022-04-11 DIAGNOSIS — R2689 Other abnormalities of gait and mobility: Secondary | ICD-10-CM | POA: Diagnosis not present

## 2022-04-11 DIAGNOSIS — M25572 Pain in left ankle and joints of left foot: Secondary | ICD-10-CM | POA: Diagnosis not present

## 2022-04-11 NOTE — Therapy (Signed)
OUTPATIENT PHYSICAL THERAPY LOWER EXTREMITY TREATMENT   Patient Name: Heather Villegas MRN: [id number] DOB:July 31, 1955, 66 y.o., female Today's Date: 04/11/2022   PT End of Session - 04/11/22 0802     Visit Number 5    Number of Visits 8    Date for PT Re-Evaluation 05/09/22    Authorization Type BCBS    PT Start Time 0802    PT Stop Time 0845    PT Time Calculation (min) 43 min    Activity Tolerance Patient tolerated treatment well    Behavior During Therapy [facility] for tasks assessed/performed              Past Medical History:  Diagnosis Date   Deafness in right ear    Heart murmur    Hyperlipidemia    diet  controlled   Meniere disease    Post-operative nausea and vomiting    Vertigo    To ED Friday night 9-10- was given Meclazine - improved    Past Surgical History:  Procedure Laterality Date   ABDOMINAL HYSTERECTOMY     2 attempts/ unable to remove uterus/ had a lot bleeding until ablation was done   ABLATION     bowel reconstruction  2003   had adhesions/ removed part of small intestine   BREAST BIOPSY Left 2013   left/ precancerous lesion   CATARACT EXTRACTION     left eye   KIDNEY STONE SURGERY  2018   lithotripsy   TONSILLECTOMY     TUBAL LIGATION     ureter reconnection     URETERAL REIMPLANTION     Patient Active Problem List   Diagnosis Date Noted   Left Achilles tendinitis 01/19/2022   Left lower quadrant abdominal pain 08/29/2021   Intermittent small bowel obstruction due to adhesions ([place]) 08/29/2021   Constipation 08/29/2021   Elevated glucose 07/24/2021   Hospital discharge follow-up 07/24/2021   SBO (small bowel obstruction) ([place]) 07/11/2021   Elevated LFTs 07/11/2021   Hydronephrosis 07/11/2021   Vertigo 12/19/2020   Post-operative nausea and vomiting 12/19/2020   Deafness in right ear 12/19/2020   Vestibular hypofunction of both ears 06/03/2020   Mitral regurgitation mild based on the echo done in 2020 07/15/2019   Asymmetric SNHL  (sensorineural hearing loss) 05/07/2019   Tinnitus of both ears 05/07/2019   Meniere's disease of left ear 02/10/2019   Elevated LDL cholesterol level 02/10/2019   Dyslipidemia 01/05/2019   Vitamin D deficiency 12/05/2018   DOE (dyspnea on exertion) 12/04/2018   Heart murmur 12/04/2018   Healthcare maintenance 12/04/2018    PCP: Abelino Derrick  REFERRING PROVIDER: Aundra Dubin, PA-C   REFERRING DIAG: [phone] (ICD-10-CM) - Left Achilles tendinitis   THERAPY DIAG:  Pain in left ankle and joints of left foot  Stiffness of left ankle, not elsewhere classified  Other abnormalities of gait and mobility  Localized edema  Rationale for Evaluation and Treatment Rehabilitation  ONSET DATE: ~6 weeks ago  SUBJECTIVE:   SUBJECTIVE STATEMENT: Pt reports heel continues to do well. Pt states she is completely out of the boot. Has been doing well at work with just the shoe.   PERTINENT HISTORY: Meniere's on R ear (reports baseline decreased balance)   PAIN:  Are you having pain? Yes: NPRS scale: 0 currently/10 Pain location: Back of L heel, mid achilles Pain description: Sharp, normally aching/sore/stiff Aggravating factors: Standing on toes, not wearing boot and walking Relieving factors: Boot  PRECAUTIONS: Deaf in R ear, 80% intact L  ear  WEIGHT BEARING RESTRICTIONS: No  FALLS:  Has patient fallen in last 6 months? No  PATIENT GOALS: Improve pain with walking; return to walking dog (usually 2-3 miles)  OBJECTIVE:  From initial eval LOWER EXTREMITY MMT:  MMT Right eval Left eval  Hip flexion    Hip extension    Hip abduction    Hip adduction    Hip internal rotation    Hip external rotation    Knee flexion    Knee extension    Ankle dorsiflexion 5 5  Ankle plantarflexion 5 4+  Ankle inversion 5 5  Ankle eversion 5 5   (Blank rows = not tested)  LOWER EXTREMITY ROM:  AROM Right eval Left eval  Hip flexion    Hip extension    Hip abduction    Hip  adduction    Hip internal rotation    Hip external rotation    Knee flexion    Knee extension    Ankle dorsiflexion 10 5  Ankle plantarflexion 42 35  Ankle inversion 35 25  Ankle eversion 25 30   (Blank rows = not tested)  FUNCTIONAL TESTS:  R 1/2 tandem: 15 sec, L 1/2 tandem: 14 sec DL heel raise x10 -- L heel raises less and painful  [facility] Adult PT Treatment:                                                DATE: 04/11/22 Therapeutic Exercise: Nustep L6 x 5 min Standing Gastroc stretch 2 x 30 sec Soleus stretch 2 x 30 sec DL heel raises toes in, toes out, toes neutral 2x10 each on airex SL heel raise 2x10 SLS 2x30 sec Tandem stance 2x30 sec on solid surface and then airex Arch lifting 2x10 on airex On blue side of bosu: inv, ev, DF 2x10 each  Neuromuscular re-ed: Resisted walking with sports cord R&L, forwards and backwards x 10 each   [facility] Adult PT Treatment:                                                DATE: 04/05/22 Therapeutic Exercise: Recumbent bike L3 x 5 min Standing Gastroc stretch 2 x 30 sec Soleus stretch 2 x 30 sec Toe yoga 2x10 Arch lifting 2x10 Tandem stance 2x30 sec Heel raises toes in, toes out, toes neutral 2x10 each on 4" step Heel raise knees flexed 2x10  Neuromuscular re-ed: Resisted walking with sports cord R&L, forwards and backwards x 10 each   [facility] Adult PT Treatment:                                                DATE: 03/27/22 Therapeutic Exercise: Nu-step level 5 Ues/Les x 5 minutes Seated Great toe extension stretch Toe yoga lifting bilat great toes, then bilat lateral toes x 12 reps Standing  Heel raises with toes in, toes out, toes neutral, and ball between heels x 12 reps in each position Single leg stance Marching x 10 feet x 4 reps Backwards walking x 10 feet x 4 reps Sidestepping toes in and then toes  out Rocking for ankle mobilization with one leg on 12" surface x 10 reps  Manual Therapy: Deep soft tissue work with  palpable tenderness and muscle trigger point in L soleous and gastrocs Joint mobilization to improve flexibility of foot working on mobility of metatarsals, subtalar mobility, and P/ROM into overpressure DF Neuromuscular re-ed: Rocking in stride for balance control and for ankle control x 10 reps with SBA due to imbalance    PATIENT EDUCATION:  Education details: HEP discussion, dry needling handout Person educated: Patient Education method: Consulting civil engineer, Demonstration, and Handouts Education comprehension: verbalized understanding, returned demonstration, and needs further education  HOME EXERCISE PROGRAM: Access Code: [email or web address] URL: [email or web address] Date: 03/20/2022 Prepared by: Estill Bamberg April Thurnell Garbe  Exercises - Gastroc Stretch on Wall  - 1 x daily - 7 x weekly - 2 sets - 30 sec hold - Soleus Stretch on Wall  - 1 x daily - 7 x weekly - 2 sets - 30 sec hold - Seated Ankle Inversion with Resistance  - 1 x daily - 7 x weekly - 2 sets - 10 reps - Toe Yoga - Alternating Great Toe and Lesser Toe Extension  - 1 x daily - 7 x weekly - 2 sets - 10 reps - Seated Arch Lifts  - 1 x daily - 7 x weekly - 2 sets - 10 reps - Seated Heel Toe Raises  - 1 x daily - 7 x weekly - 3 sets - 10 reps - Standing Heel Raise with Support  - 1 x daily - 7 x weekly - 3 sets - 10 reps   ASSESSMENT:  CLINICAL IMPRESSION: Pt has continued to do well. Able to tolerate single leg heel raise without pain. Session focused on improving single leg stability and strength.   OBJECTIVE IMPAIRMENTS: Abnormal gait, decreased activity tolerance, decreased balance, decreased mobility, difficulty walking, decreased ROM, decreased strength, increased edema, increased fascial restrictions, increased muscle spasms, improper body mechanics, postural dysfunction, and pain.   ACTIVITY LIMITATIONS: standing, transfers, locomotion level, and caring for others  PARTICIPATION LIMITATIONS: cleaning, shopping,  community activity, and occupation  PERSONAL FACTORS: Age, Past/current experiences, Profession, and Time since onset of injury/illness/exacerbation are also affecting patient's functional outcome.   REHAB POTENTIAL: Good  CLINICAL DECISION MAKING: Stable/uncomplicated  EVALUATION COMPLEXITY: Low   GOALS: Goals reviewed with patient? Yes  SHORT TERM GOALS: Target date: 04/11/2022  Pt will be ind with initial HEP Baseline: Goal status:MET  2.  Pt will demo L = R ankle ROM Baseline:  Goal status:MET  LONG TERM GOALS: Target date: 05/09/2022   Pt will be ind with continuing HEP and exercises for community wellness Baseline:  Goal status:IN PROGRESS  2.  Pt will be able to perform single leg heel raise x10 without pain on L Baseline:  Goal status: MET  3.  Pt will be able to walk with her dog 2-3 miles without pain Baseline:  Goal status: IN PROGRESS  4.  Pt will be able to demo improved foot/ankle stability by maintaining 1/2 tandem stance at least 20 sec Baseline:  Goal status: IN PROGRESS  5.  Pt will demo no tenderness to palpation of gastroc/soleus/Achilles Baseline:  Goal status:IN PROGRESS  6.   Pt will demo improved FOTO score to >/= 79 Baseline:  Goal status: IN PROGRESS  PLAN:  PT FREQUENCY: 1x/week  PT DURATION: 8 weeks  PLANNED INTERVENTIONS: Therapeutic exercises, Therapeutic activity, Neuromuscular re-education, Balance training, Gait training, Patient/Family education, Self Care, Joint mobilization, Stair training, Aquatic  Therapy, Dry Needling, Electrical stimulation, Cryotherapy, Moist heat, Taping, Ultrasound, Ionotophoresis 47m/ml Dexamethasone, Manual therapy, and Re-evaluation  PLAN FOR NEXT SESSION: Assess response to initial HEP. Continue manual work as indicated. Discuss TPDN and perform as indicated. Continue to stretch/strengthen ankle/foot.    Kore Madlock April Ma L Travez Stancil, PT, DPT 04/11/2022, 8:05 AM

## 2022-04-24 ENCOUNTER — Ambulatory Visit: Payer: Federal, State, Local not specified - PPO | Admitting: Cardiology

## 2022-04-25 ENCOUNTER — Ambulatory Visit: Payer: Federal, State, Local not specified - PPO | Admitting: Physical Therapy

## 2022-05-02 ENCOUNTER — Ambulatory Visit: Payer: Federal, State, Local not specified - PPO | Attending: Orthopaedic Surgery | Admitting: Physical Therapy

## 2022-05-02 ENCOUNTER — Encounter: Payer: Self-pay | Admitting: Physical Therapy

## 2022-05-02 DIAGNOSIS — M25672 Stiffness of left ankle, not elsewhere classified: Secondary | ICD-10-CM | POA: Diagnosis not present

## 2022-05-02 DIAGNOSIS — R600 Localized edema: Secondary | ICD-10-CM | POA: Diagnosis not present

## 2022-05-02 DIAGNOSIS — M25572 Pain in left ankle and joints of left foot: Secondary | ICD-10-CM | POA: Diagnosis not present

## 2022-05-02 DIAGNOSIS — R2689 Other abnormalities of gait and mobility: Secondary | ICD-10-CM | POA: Diagnosis not present

## 2022-05-02 NOTE — Therapy (Addendum)
OUTPATIENT PHYSICAL THERAPY LOWER EXTREMITY TREATMENT AND DISCHARGE   Patient Name: Heather Villegas MRN: [id number] DOB:[birth year], 66 y.o., female Today's Date: 05/02/2022   PT End of Session - 05/02/22 0857     Visit Number 6    Number of Visits 8    Date for PT Re-Evaluation 05/09/22    Authorization Type BCBS    PT Start Time [id number]    PT Stop Time 0930    PT Time Calculation (min) 38 min    Activity Tolerance Patient tolerated treatment well    Behavior During Therapy WFL for tasks assessed/performed            PHYSICAL THERAPY DISCHARGE SUMMARY  Visits from Start of Care: 6  Current functional level related to goals / functional outcomes: See below   Remaining deficits: See below   Education / Equipment: See below   Patient agrees to discharge. Patient goals were met. Patient is being discharged due to meeting the stated rehab goals.   Past Medical History:  Diagnosis Date   Deafness in right ear    Heart murmur    Hyperlipidemia    diet  controlled   Meniere disease    Post-operative nausea and vomiting    Vertigo    To ED Friday night 9-10- was given Meclazine - improved    Past Surgical History:  Procedure Laterality Date   ABDOMINAL HYSTERECTOMY     2 attempts/ unable to remove uterus/ had a lot bleeding until ablation was done   ABLATION     bowel reconstruction  2003   had adhesions/ removed part of small intestine   BREAST BIOPSY Left 2013   left/ precancerous lesion   CATARACT EXTRACTION     left eye   KIDNEY STONE SURGERY  2018   lithotripsy   TONSILLECTOMY     TUBAL LIGATION     ureter reconnection     URETERAL REIMPLANTION     Patient Active Problem List   Diagnosis Date Noted   Left Achilles tendinitis 01/19/2022   Left lower quadrant abdominal pain 08/29/2021   Intermittent small bowel obstruction due to adhesions ([place]) 08/29/2021   Constipation 08/29/2021   Elevated glucose 07/24/2021   Hospital discharge follow-up 07/24/2021    SBO (small bowel obstruction) ([place]) 07/11/2021   Elevated LFTs 07/11/2021   Hydronephrosis 07/11/2021   Vertigo 12/19/2020   Post-operative nausea and vomiting 12/19/2020   Deafness in right ear 12/19/2020   Vestibular hypofunction of both ears 06/03/2020   Mitral regurgitation mild based on the echo done in 2020 07/15/2019   Asymmetric SNHL (sensorineural hearing loss) 05/07/2019   Tinnitus of both ears 05/07/2019   Meniere's disease of left ear 02/10/2019   Elevated LDL cholesterol level 02/10/2019   Dyslipidemia 01/05/2019   Vitamin D deficiency 12/05/2018   DOE (dyspnea on exertion) 12/04/2018   Heart murmur 12/04/2018   Healthcare maintenance 12/04/2018    PCP: Abelino Derrick  REFERRING PROVIDER: Aundra Dubin, PA-C   REFERRING DIAG: [phone] (ICD-10-CM) - Left Achilles tendinitis   THERAPY DIAG:  Pain in left ankle and joints of left foot  Stiffness of left ankle, not elsewhere classified  Other abnormalities of gait and mobility  Localized edema  Rationale for Evaluation and Treatment Rehabilitation  ONSET DATE: ~6 weeks ago  SUBJECTIVE:   SUBJECTIVE STATEMENT: Pt states she has been having no issues or pain. Has been able to walk 12,000 steps. Still has one last layer of the heel lift. Did  note some "shock" sensation into her heel but it went away.  PERTINENT HISTORY: Meniere's on R ear (reports baseline decreased balance)   PAIN:  Are you having pain? Yes: NPRS scale: 0 currently/10 Pain location: Back of L heel, mid achilles Pain description: Sharp, normally aching/sore/stiff Aggravating factors: Standing on toes, not wearing boot and walking Relieving factors: Boot  PRECAUTIONS: Deaf in R ear, 80% intact L ear  WEIGHT BEARING RESTRICTIONS: No  FALLS:  Has patient fallen in last 6 months? No  PATIENT GOALS: Improve pain with walking; return to walking dog (usually 2-3 miles)  OBJECTIVE:  From initial eval LOWER EXTREMITY MMT:  MMT  Right eval Left eval  Hip flexion    Hip extension    Hip abduction    Hip adduction    Hip internal rotation    Hip external rotation    Knee flexion    Knee extension    Ankle dorsiflexion 5 5  Ankle plantarflexion 5 4+  Ankle inversion 5 5  Ankle eversion 5 5   (Blank rows = not tested)  LOWER EXTREMITY ROM:  AROM Right eval Left eval  Hip flexion    Hip extension    Hip abduction    Hip adduction    Hip internal rotation    Hip external rotation    Knee flexion    Knee extension    Ankle dorsiflexion 10 5  Ankle plantarflexion 42 35  Ankle inversion 35 25  Ankle eversion 25 30   (Blank rows = not tested)  FUNCTIONAL TESTS:  R 1/2 tandem: 15 sec, L 1/2 tandem: 14 sec DL heel raise x10 -- L heel raises less and painful  12/13 R 1/2 tandem: 30 sec, L 1/2 tandem: 30 sec R full tandem 13 sec, L 30 sec  FOTO: 99 on 12/13  [facility] Adult PT Treatment:                                                DATE: 05/02/22 Therapeutic Exercise: Nustep L6 x 6 min Figure 4 stretch x30 sec Hamstring stretch x30 sec Standing gastroc stretch 2x30 sec Standing soleus stretch 2x30 sec On blue side of bosu Single leg heel raise on L 2x10 Single leg heel raise toes in and then out 2x10 each Neuromuscular re-ed: Tandem stance 2x30 sec R&L Resisted walking at pulleys 15# x10 each direction    [facility] Adult PT Treatment:                                                DATE: 04/11/22 Therapeutic Exercise: Nustep L6 x 5 min Standing Gastroc stretch 2 x 30 sec Soleus stretch 2 x 30 sec DL heel raises toes in, toes out, toes neutral 2x10 each on airex SL heel raise 2x10 SLS 2x30 sec Tandem stance 2x30 sec on solid surface and then airex Arch lifting 2x10 on airex On blue side of bosu: inv, ev, DF 2x10 each  Neuromuscular re-ed: Resisted walking with sports cord R&L, forwards and backwards x 10 each   [facility] Adult PT Treatment:  DATE: 04/05/22 Therapeutic Exercise: Recumbent bike L3 x 5 min Standing Gastroc stretch 2 x 30 sec Soleus stretch 2 x 30 sec Toe yoga 2x10 Arch lifting 2x10 Tandem stance 2x30 sec Heel raises toes in, toes out, toes neutral 2x10 each on 4" step Heel raise knees flexed 2x10  Neuromuscular re-ed: Resisted walking with sports cord R&L, forwards and backwards x 10 each    PATIENT EDUCATION:  Education details: HEP discussion, dry needling handout Person educated: Patient Education method: Explanation, Demonstration, and Handouts Education comprehension: verbalized understanding, returned demonstration, and needs further education  HOME EXERCISE PROGRAM: Access Code: [email or web address] URL: [email or web address] Date: 03/20/2022 Prepared by: Estill Bamberg April Thurnell Garbe  Exercises - Gastroc Stretch on Wall  - 1 x daily - 7 x weekly - 2 sets - 30 sec hold - Soleus Stretch on Wall  - 1 x daily - 7 x weekly - 2 sets - 30 sec hold - Seated Ankle Inversion with Resistance  - 1 x daily - 7 x weekly - 2 sets - 10 reps - Toe Yoga - Alternating Great Toe and Lesser Toe Extension  - 1 x daily - 7 x weekly - 2 sets - 10 reps - Seated Arch Lifts  - 1 x daily - 7 x weekly - 2 sets - 10 reps - Seated Heel Toe Raises  - 1 x daily - 7 x weekly - 3 sets - 10 reps - Standing Heel Raise with Support  - 1 x daily - 7 x weekly - 3 sets - 10 reps   ASSESSMENT:  CLINICAL IMPRESSION: Pt continues to progress well and has been meeting her goals. Pt is now down to no heel lift. Progressed HEP to all single leg exercises. Continued to work on stability; however, pt has demonstrated good gains with improved tandem stance (pt was only able to perform 1/2 tandem on eval). At this point, pt is near PT d/c. Discussed canceling next appointment if she continues to do well without any heel lift.   OBJECTIVE IMPAIRMENTS: Abnormal gait, decreased activity tolerance, decreased balance, decreased mobility,  difficulty walking, decreased ROM, decreased strength, increased edema, increased fascial restrictions, increased muscle spasms, improper body mechanics, postural dysfunction, and pain.   ACTIVITY LIMITATIONS: standing, transfers, locomotion level, and caring for others  PARTICIPATION LIMITATIONS: cleaning, shopping, community activity, and occupation  PERSONAL FACTORS: Age, Past/current experiences, Profession, and Time since onset of injury/illness/exacerbation are also affecting patient's functional outcome.   REHAB POTENTIAL: Good  CLINICAL DECISION MAKING: Stable/uncomplicated  EVALUATION COMPLEXITY: Low   GOALS: Goals reviewed with patient? Yes  SHORT TERM GOALS: Target date: 04/11/2022  Pt will be ind with initial HEP Baseline: Goal status:MET  2.  Pt will demo L = R ankle ROM Baseline:  Goal status:MET  LONG TERM GOALS: Target date: 05/09/2022   Pt will be ind with continuing HEP and exercises for community wellness Baseline:  Goal status: MET  2.  Pt will be able to perform single leg heel raise x10 without pain on L Baseline:  Goal status: MET  3.  Pt will be able to walk with her dog 2-3 miles without pain Baseline:  Goal status: MET  4.  Pt will be able to demo improved foot/ankle stability by maintaining 1/2 tandem stance at least 20 sec Baseline:  Goal status: MET  5.  Pt will demo no tenderness to palpation of gastroc/soleus/Achilles Baseline:  Goal status:IN PROGRESS  6.   Pt will demo improved FOTO score to >/=  79 Baseline: 99 on 12/13 Goal status: MET  PLAN:  PT FREQUENCY: 1x/week  PT DURATION: 8 weeks  PLANNED INTERVENTIONS: Therapeutic exercises, Therapeutic activity, Neuromuscular re-education, Balance training, Gait training, Patient/Family education, Self Care, Joint mobilization, Stair training, Aquatic Therapy, Dry Needling, Electrical stimulation, Cryotherapy, Moist heat, Taping, Ultrasound, Ionotophoresis 75m/ml Dexamethasone,  Manual therapy, and Re-evaluation  PLAN FOR NEXT SESSION: Assess response to initial HEP. Continue manual work as indicated. Discuss TPDN and perform as indicated. Continue to stretch/strengthen ankle/foot.    Jeaneane Adamec April Ma L Female [place], PT, DPT 05/02/2022, 8:58 AM

## 2022-05-09 ENCOUNTER — Ambulatory Visit: Payer: Federal, State, Local not specified - PPO

## 2022-05-31 ENCOUNTER — Encounter: Payer: Self-pay | Admitting: Cardiology

## 2022-05-31 ENCOUNTER — Ambulatory Visit: Payer: Federal, State, Local not specified - PPO | Attending: Cardiology | Admitting: Cardiology

## 2022-05-31 VITALS — BP 138/62 | HR 50 | Ht 66.0 in | Wt 189.0 lb

## 2022-05-31 DIAGNOSIS — H8102 Meniere's disease, left ear: Secondary | ICD-10-CM

## 2022-05-31 DIAGNOSIS — I340 Nonrheumatic mitral (valve) insufficiency: Secondary | ICD-10-CM | POA: Diagnosis not present

## 2022-05-31 DIAGNOSIS — E7800 Pure hypercholesterolemia, unspecified: Secondary | ICD-10-CM

## 2022-05-31 DIAGNOSIS — R0609 Other forms of dyspnea: Secondary | ICD-10-CM

## 2022-05-31 NOTE — Addendum Note (Signed)
Addended by: Jacobo Forest D on: 05/31/2022 10:01 AM   Modules accepted: Orders

## 2022-05-31 NOTE — Patient Instructions (Addendum)

## 2022-05-31 NOTE — Progress Notes (Signed)
Cardiology Office Note:    Date:  05/31/2022   ID:  Heather Villegas, DOB Oct 04, 1955, MRN [id number]  PCP:  Libby Maw, MD  Cardiologist:  Jenne Campus, MD    Referring MD: Libby Maw,*   Chief Complaint  Patient presents with   yearly follow up    History of Present Illness:    Heather Villegas is a 67 y.o. female who comes for yearly follow-up of mitral regurgitation.  Since I seen him last time 2 issues happened for several she did have Achilles tendinosis very painful but likely recovered from this second issue she did have small bowel obstruction she spent 4 days in the hospital luckily no surgical intervention has been needed.  Denies have any cardiac complaints.  There is no chest pain tightness squeezing pressure burning chest no palpitation dizziness swelling of lower extremities.  She is back at work busy working with no difficulties.  Past Medical History:  Diagnosis Date   Deafness in right ear    Heart murmur    Hyperlipidemia    diet  controlled   Meniere disease    Post-operative nausea and vomiting    Vertigo    To ED Friday night 9-10- was given Meclazine - improved     Past Surgical History:  Procedure Laterality Date   ABDOMINAL HYSTERECTOMY     2 attempts/ unable to remove uterus/ had a lot bleeding until ablation was done   ABLATION     bowel reconstruction  2003   had adhesions/ removed part of small intestine   BREAST BIOPSY Left 2013   left/ precancerous lesion   CATARACT EXTRACTION     left eye   KIDNEY STONE SURGERY  2018   lithotripsy   TONSILLECTOMY     TUBAL LIGATION     ureter reconnection     URETERAL REIMPLANTION      Current Medications: Current Meds  Medication Sig   Ascorbic Acid (VITAMIN C PO) Take 2 capsules by mouth daily. Take 2 capsules daily   atorvastatin (LIPITOR) 10 MG tablet Take 1 tablet (10 mg total) by mouth daily.   b complex vitamins tablet Take 1 tablet by mouth daily.   Coenzyme Q10 (CO Q 10  PO) Take 1 capsule by mouth daily.   Multiple Vitamin (MULTIVITAMIN) capsule Take 1 capsule by mouth daily.   Omega-3 Fatty Acids (FISH OIL) 1000 MG CAPS Take 1 capsule by mouth daily.   traMADol (ULTRAM) 50 MG tablet Take 1 tablet (50 mg total) by mouth every 12 (twelve) hours as needed. (Patient taking differently: Take 50 mg by mouth every 12 (twelve) hours as needed for moderate pain or severe pain.)   VITAMIN D PO Take 2 tablets by mouth daily.     Allergies:   Tape   Social History   Socioeconomic History   Marital status: Married    Spouse name: Not on file   Number of children: 3   Years of education: 12   Highest education level: High school graduate  Occupational History   Occupation: Probation officer  Tobacco Use   Smoking status: Former    Types: Cigarettes    Quit date: 1987    Years since quitting: 37.0   Smokeless tobacco: Never  Vaping Use   Vaping Use: Never used  Substance and Sexual Activity   Alcohol use: Not Currently   Drug use: Never   Sexual activity: Not Currently    Comment: 1st intercourse-  71, partners- 75, married  Other Topics Concern   Not on file  Social History Narrative   Lives at home with her husband.   Right-handed.   1-2 cups caffeine per day.   Social Determinants of Health   Financial Resource Strain: Not on file  Food Insecurity: Not on file  Transportation Needs: Not on file  Physical Activity: Not on file  Stress: Not on file  Social Connections: Not on file     Family History: The patient's family history includes Drug abuse in her brother; Lung cancer in her father and mother; Thrombosis in her father. There is no history of Colon cancer, Colon polyps, Esophageal cancer, Rectal cancer, or Stomach cancer. ROS:   Please see the history of present illness.    All 14 point review of systems negative except as described per history of present illness  EKGs/Labs/Other Studies Reviewed:      Recent  Labs: 07/12/2021: TSH 3.114 07/14/2021: Magnesium 2.0 07/24/2021: ALT 29 08/29/2021: BUN 13; Creatinine, Ser 0.68; Hemoglobin 13.7; Platelets 266.0; Potassium 4.2; Sodium 139  Recent Lipid Panel    Component Value Date/Time   CHOL 165 07/12/2021 0520   CHOL 232 (H) 12/26/2020 0953   TRIG 111 07/12/2021 0520   HDL 45 07/12/2021 0520   HDL 66 12/26/2020 0953   CHOLHDL 3.7 07/12/2021 0520   VLDL 22 07/12/2021 0520   LDLCALC 98 07/12/2021 0520   LDLCALC 146 (H) 12/26/2020 0953   LDLDIRECT 145.0 02/10/2019 0859    Physical Exam:    VS:  BP 138/62 (BP Location: Left Arm, Patient Position: Sitting)   Pulse (!) 50   Ht 5\' 6"  (1.676 m)   Wt 189 lb (85.7 kg)   PF 98 L/min   BMI 30.51 kg/m     Wt Readings from Last 3 Encounters:  05/31/22 189 lb (85.7 kg)  01/17/22 180 lb (81.6 kg)  08/29/21 179 lb 12.8 oz (81.6 kg)     GEN:  Well nourished, well developed in no acute distress HEENT: Normal NECK: No JVD; No carotid bruits LYMPHATICS: No lymphadenopathy CARDIAC: RRR, no murmurs, no rubs, no gallops RESPIRATORY:  Clear to auscultation without rales, wheezing or rhonchi  ABDOMEN: Soft, non-tender, non-distended MUSCULOSKELETAL:  No edema; No deformity  SKIN: Warm and dry LOWER EXTREMITIES: no swelling NEUROLOGIC:  Alert and oriented x 3 PSYCHIATRIC:  Normal affect   ASSESSMENT:    1. Nonrheumatic mitral valve regurgitation   2. Elevated LDL cholesterol level   3. Meniere's disease of left ear    PLAN:    In order of problems listed above:  Mitral regurgitation denies have any signs and symptoms of decompensation, will get echocardiogram to reassess the valve, last echocardiogram was done in 2020. Dyslipidemia I did review her K PN which show me her LDL of 98 HDL 45 that is a decent cholesterol profile.  She is on Lipitor 10 which we will continue. History of Mnire's disease stable followed by internal medicine team   Medication Adjustments/Labs and Tests  Ordered: Current medicines are reviewed at length with the patient today.  Concerns regarding medicines are outlined above.  No orders of the defined types were placed in this encounter.  Medication changes: No orders of the defined types were placed in this encounter.   Signed, Park Liter, MD, [facility] 05/31/2022 9:49 AM    [place]

## 2022-06-20 ENCOUNTER — Ambulatory Visit (HOSPITAL_BASED_OUTPATIENT_CLINIC_OR_DEPARTMENT_OTHER)
Admission: RE | Admit: 2022-06-20 | Discharge: 2022-06-20 | Disposition: A | Discharge status: Home / Self Care | Payer: Federal, State, Local not specified - PPO | Source: Ambulatory Visit | Attending: Cardiology | Admitting: Cardiology

## 2022-06-20 DIAGNOSIS — R0609 Other forms of dyspnea: Secondary | ICD-10-CM

## 2022-06-20 LAB — ECHOCARDIOGRAM COMPLETE
Area-P 1/2: 3.42 cm2
MV M vel: 4.12 m/s
MV Peak grad: 67.9 mmHg
S' Lateral: 2.6 cm

## 2022-06-27 ENCOUNTER — Telehealth: Payer: Self-pay

## 2022-06-27 NOTE — Telephone Encounter (Signed)
Patient notified of results.

## 2022-06-27 NOTE — Telephone Encounter (Signed)
-----   Message from Park Liter, MD sent at 06/21/2022 12:24 PM EST ----- Echocardiac showed normal left ventricle ejection fraction, overall looks good

## 2022-09-13 ENCOUNTER — Other Ambulatory Visit: Payer: Self-pay | Admitting: Obstetrics & Gynecology

## 2022-09-13 DIAGNOSIS — Z1231 Encounter for screening mammogram for malignant neoplasm of breast: Secondary | ICD-10-CM

## 2022-09-17 ENCOUNTER — Ambulatory Visit
Admission: RE | Admit: 2022-09-17 | Discharge: 2022-09-17 | Disposition: A | Discharge status: Home / Self Care | Payer: Federal, State, Local not specified - PPO | Source: Ambulatory Visit | Attending: Obstetrics & Gynecology | Admitting: Obstetrics & Gynecology

## 2022-09-17 DIAGNOSIS — Z1231 Encounter for screening mammogram for malignant neoplasm of breast: Secondary | ICD-10-CM

## 2022-12-05 IMAGING — MG MM DIGITAL SCREENING BILAT W/ TOMO AND CAD
6 of 10 series · 6 of 30 positions shown · non-contrast
Comparison: Previous exam(s).

CLINICAL DATA: Screening.

EXAM:
DIGITAL SCREENING BILATERAL MAMMOGRAM WITH TOMOSYNTHESIS AND CAD
TECHNIQUE: Bilateral screening digital craniocaudal and mediolateral oblique
mammograms were obtained. Bilateral screening digital breast
tomosynthesis was performed. The images were evaluated with
computer-aided detection.

[R CC synth-2D]
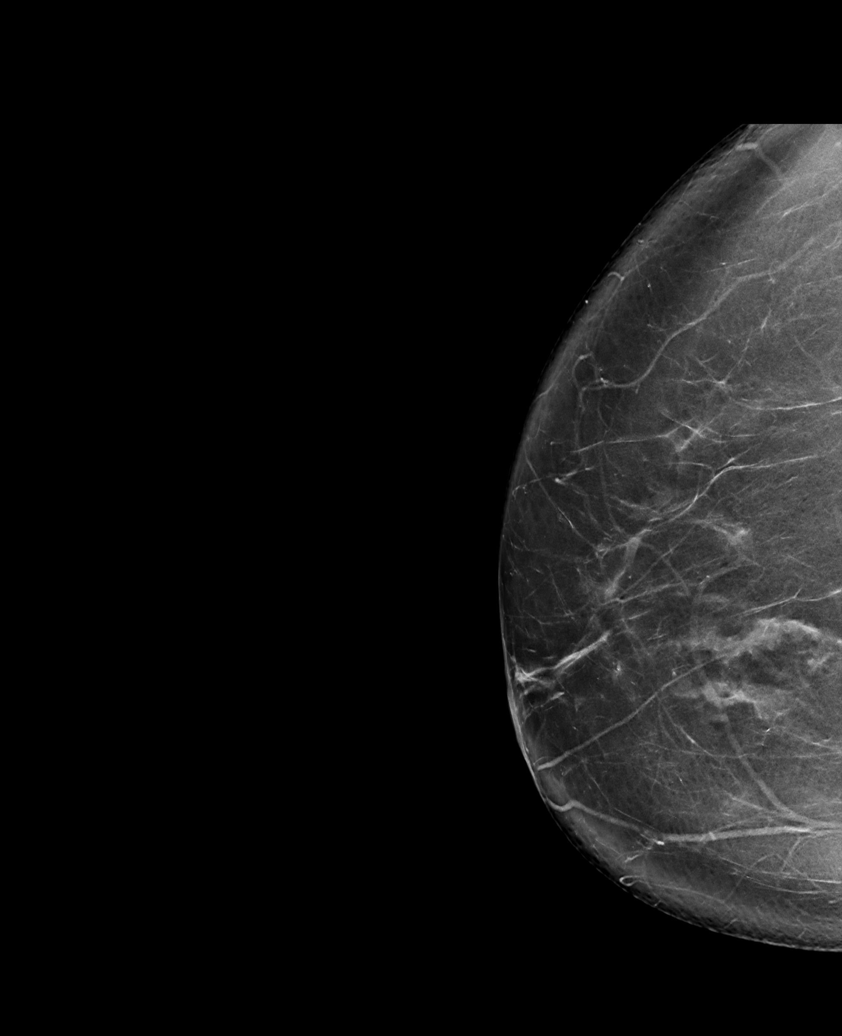

[L MLO synth-2D (1 of 2)]
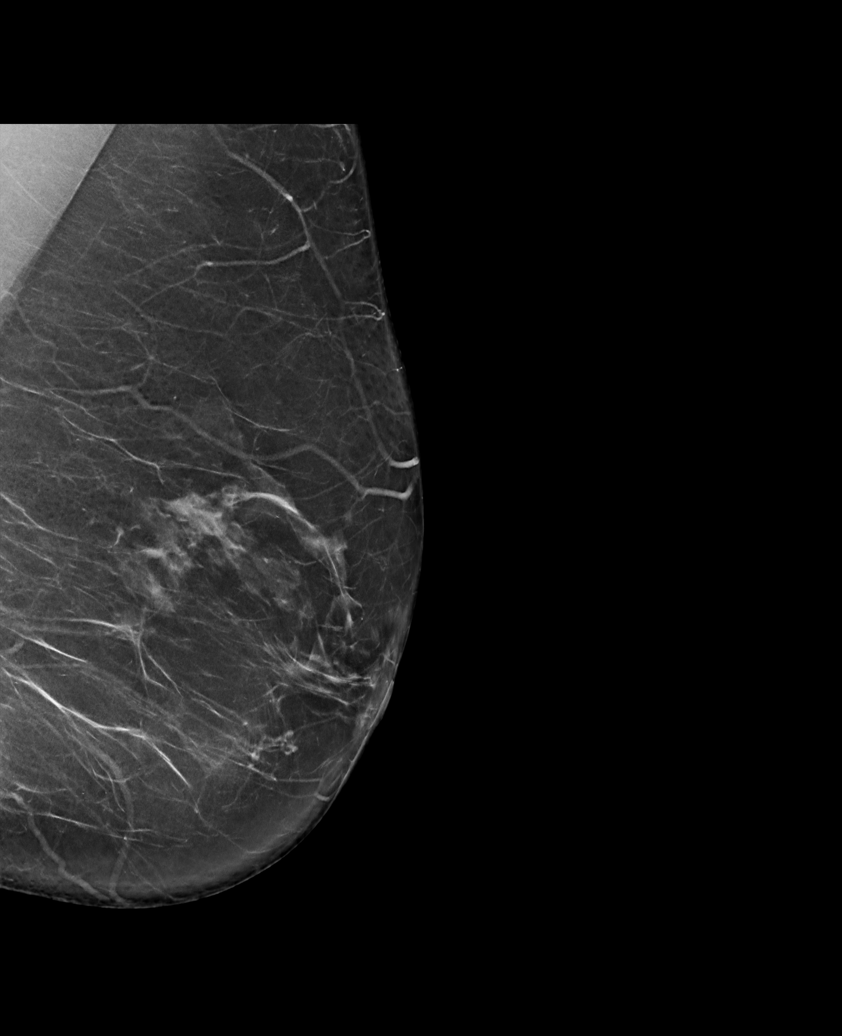

[R MLO synth-2D]
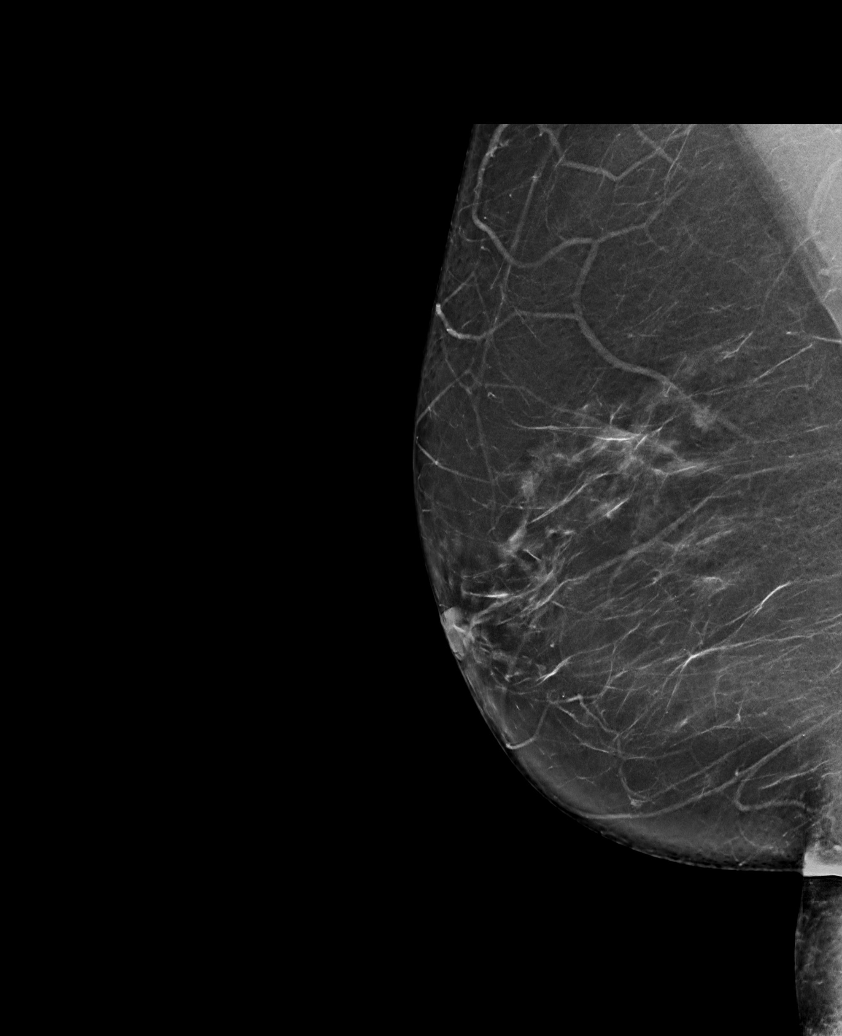

[L MLO synth-2D (2 of 2)]
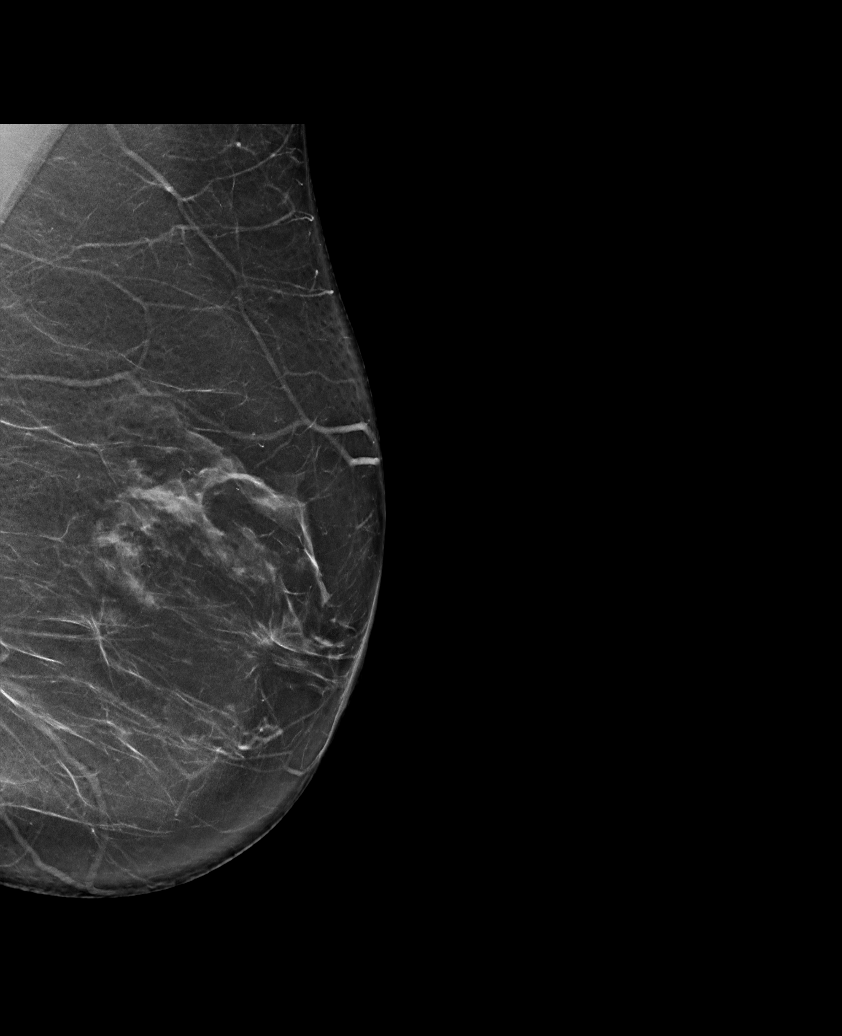

[L CC synth-2D]
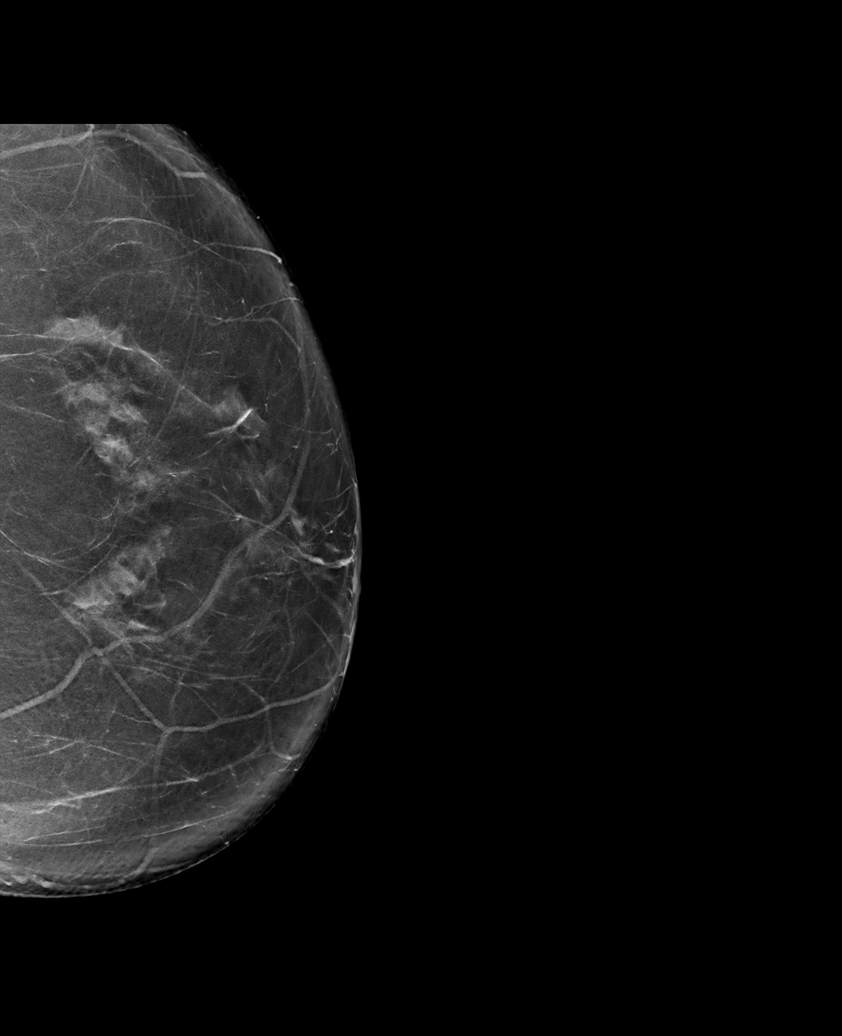

[R MLO tomo · tomo slice 45/89.0]
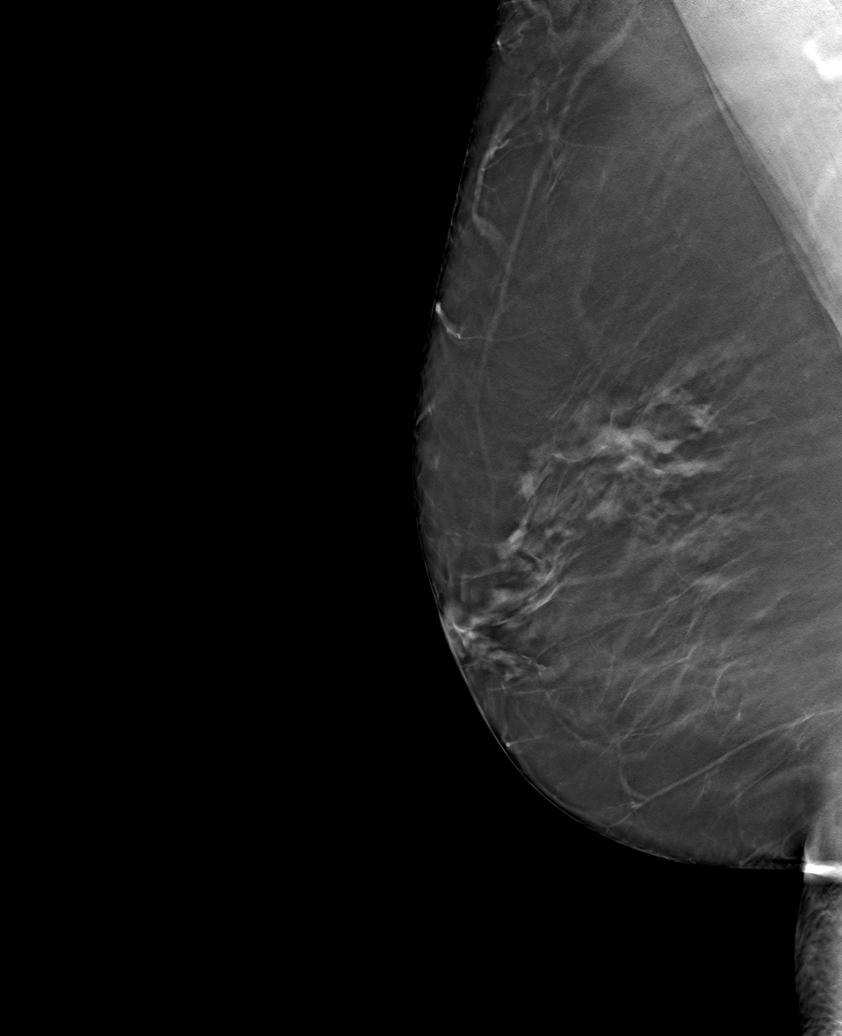

[6 of 30 positions shown; findings below may reference images not displayed]

ACR Breast Density Category b: There are scattered areas of
fibroglandular density.
FINDINGS: There are no findings suspicious for malignancy. The images were
evaluated with computer-aided detection.
IMPRESSION: No mammographic evidence of malignancy. A result letter of this
screening mammogram will be mailed directly to the patient.

RECOMMENDATION:
Screening mammogram in one year. (Code:[id number]-[id number])

BI-RADS CATEGORY  1: Negative.

## 2023-01-31 ENCOUNTER — Encounter: Payer: Self-pay | Admitting: Family Medicine

## 2023-01-31 ENCOUNTER — Ambulatory Visit (INDEPENDENT_AMBULATORY_CARE_PROVIDER_SITE_OTHER): Payer: Federal, State, Local not specified - PPO | Admitting: Family Medicine

## 2023-01-31 VITALS — BP 144/68 | HR 48 | Temp 96.8°F | Ht 66.0 in | Wt 196.6 lb

## 2023-01-31 DIAGNOSIS — Z0001 Encounter for general adult medical examination with abnormal findings: Secondary | ICD-10-CM | POA: Diagnosis not present

## 2023-01-31 DIAGNOSIS — Z23 Encounter for immunization: Secondary | ICD-10-CM | POA: Diagnosis not present

## 2023-01-31 DIAGNOSIS — R7309 Other abnormal glucose: Secondary | ICD-10-CM | POA: Diagnosis not present

## 2023-01-31 DIAGNOSIS — Z0000 Encounter for general adult medical examination without abnormal findings: Secondary | ICD-10-CM

## 2023-01-31 DIAGNOSIS — R030 Elevated blood-pressure reading, without diagnosis of hypertension: Secondary | ICD-10-CM | POA: Diagnosis not present

## 2023-01-31 DIAGNOSIS — E559 Vitamin D deficiency, unspecified: Secondary | ICD-10-CM

## 2023-01-31 DIAGNOSIS — E7800 Pure hypercholesterolemia, unspecified: Secondary | ICD-10-CM | POA: Diagnosis not present

## 2023-01-31 DIAGNOSIS — G5711 Meralgia paresthetica, right lower limb: Secondary | ICD-10-CM | POA: Insufficient documentation

## 2023-01-31 LAB — CBC
HCT: 42.2 % (ref 36.0–46.0)
Hemoglobin: 13.7 g/dL (ref 12.0–15.0)
MCHC: 32.6 g/dL (ref 30.0–36.0)
MCV: 90.5 fl (ref 78.0–100.0)
Platelets: 248 10*3/uL (ref 150.0–400.0)
RBC: 4.66 Mil/uL (ref 3.87–5.11)
RDW: 14 % (ref 11.5–15.5)
WBC: 4.1 10*3/uL (ref 4.0–10.5)

## 2023-01-31 LAB — COMPREHENSIVE METABOLIC PANEL
ALT: 20 U/L (ref 0–35)
AST: 23 U/L (ref 0–37)
Albumin: 4.2 g/dL (ref 3.5–5.2)
Alkaline Phosphatase: 81 U/L (ref 39–117)
BUN: 14 mg/dL (ref 6–23)
CO2: 28 meq/L (ref 19–32)
Calcium: 9.8 mg/dL (ref 8.4–10.5)
Chloride: 104 meq/L (ref 96–112)
Creatinine, Ser: 0.68 mg/dL (ref 0.40–1.20)
GFR: 91.43 mL/min (ref >60.00)
Glucose, Bld: 83 mg/dL (ref 70–99)
Potassium: 4.1 meq/L (ref 3.5–5.1)
Sodium: 141 meq/L (ref 135–145)
Total Bilirubin: 0.7 mg/dL (ref 0.2–1.2)
Total Protein: 6.8 g/dL (ref 6.0–8.3)

## 2023-01-31 LAB — URINALYSIS, ROUTINE W REFLEX MICROSCOPIC
Bilirubin Urine: NEGATIVE
Hgb urine dipstick: NEGATIVE
Ketones, ur: NEGATIVE
Leukocytes,Ua: NEGATIVE
Nitrite: NEGATIVE
RBC / HPF: NONE SEEN (ref 0–?)
Specific Gravity, Urine: <=1.005 — AB (ref 1.000–1.030)
Total Protein, Urine: NEGATIVE
Urine Glucose: NEGATIVE
Urobilinogen, UA: 0.2 (ref 0.0–1.0)
pH: 6 (ref 5.0–8.0)

## 2023-01-31 LAB — LIPID PANEL
Cholesterol: 198 mg/dL (ref 0–200)
HDL: 67.6 mg/dL (ref >39.00)
LDL Cholesterol: 112 mg/dL — ABNORMAL HIGH (ref 0–99)
NonHDL: 130.34
Total CHOL/HDL Ratio: 3
Triglycerides: 90 mg/dL (ref 0.0–149.0)
VLDL: 18 mg/dL (ref 0.0–40.0)

## 2023-01-31 LAB — HEMOGLOBIN A1C: Hgb A1c MFr Bld: 5.4 % (ref 4.6–6.5)

## 2023-01-31 LAB — VITAMIN D 25 HYDROXY (VIT D DEFICIENCY, FRACTURES): VITD: 56.85 ng/mL (ref 30.00–100.00)

## 2023-01-31 NOTE — Progress Notes (Signed)
Established Patient Office Visit   Subjective:  Patient ID: Heather Villegas, female    DOB: [birth year]  Age: 67 y.o. MRN: [id number]  Chief Complaint  Patient presents with   Annual Exam    CPE. Pt is fasting.    Numbness    Pt complains of riht thigh numbness, tingling and burning.     HPI Encounter Diagnoses  Name Primary?   Need for immunization against influenza Yes   Immunization due    Healthcare maintenance    Elevated glucose    Elevated LDL cholesterol level    Meralgia paresthetica of right side    Elevated BP without diagnosis of hypertension    Vitamin D deficiency    For yearly physical and follow-up of above.  Doing well.  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  She lives with her husband.  Their children are grown.  She has regular dental care.  She exercises by walking her dog for 30 minutes daily.  She is not having an area of numbness on the lateral side of her right hip.  Denies back pain or weakness in her lower extremity.  No bowel or bladder incontinence.  She stands at work but does have an anti shock matt.  Reminds me that she has 1 functioning kidney.  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  She has no history of hypertension or diabetes.   Review of Systems  Constitutional: Negative.   HENT: Negative.    Eyes:  Negative for blurred vision, discharge and redness.  Respiratory: Negative.    Cardiovascular: Negative.   Gastrointestinal:  Negative for abdominal pain.  Genitourinary: Negative.   Musculoskeletal: Negative.  Negative for back pain and myalgias.  Skin:  Negative for rash.  Neurological:  Positive for tingling. Negative for loss of consciousness and weakness.  Endo/Heme/Allergies:  Negative for polydipsia.     Current Outpatient Medications:    Ascorbic Acid (VITAMIN C PO), Take 2 capsules by mouth daily. Take 2 capsules daily, Disp: , Rfl:     atorvastatin (LIPITOR) 10 MG tablet, Take 1 tablet (10 mg total) by mouth daily., Disp: 90 tablet, Rfl: 3   b complex vitamins tablet, Take 1 tablet by mouth daily., Disp: , Rfl:    Coenzyme Q10 (CO Q 10 PO), Take 1 capsule by mouth daily., Disp: , Rfl:    Multiple Vitamin (MULTIVITAMIN) capsule, Take 1 capsule by mouth daily., Disp: , Rfl:    Omega-3 Fatty Acids (FISH OIL) 1000 MG CAPS, Take 1 capsule by mouth daily., Disp: , Rfl:    VITAMIN D PO, Take 2 tablets by mouth daily., Disp: , Rfl:    Objective:     BP (!) 144/68   Pulse (!) 48   Temp (!) 96.8 F (36 C)   Ht 5\' 6"  (1.676 m)   Wt 196 lb 9.6 oz (89.2 kg)   PF 98 L/min   BMI 31.73 kg/m  BP Readings from Last 3 Encounters:  01/31/23 (!) 144/68  05/31/22 138/62  01/17/22 (!) 147/55   Wt Readings from Last 3 Encounters:  01/31/23 196 lb 9.6 oz (89.2 kg)  05/31/22 189 lb (85.7 kg)  01/17/22 180 lb (81.6 kg)      Physical Exam Constitutional:      General: She is not in acute distress.    Appearance: Normal appearance. She is not ill-appearing, toxic-appearing or diaphoretic.  HENT:  Head: Normocephalic and atraumatic.     Right Ear: Tympanic membrane, ear canal and external ear normal.     Left Ear: Tympanic membrane, ear canal and external ear normal.     Mouth/Throat:     Mouth: Mucous membranes are moist.     Pharynx: Oropharynx is clear. No oropharyngeal exudate or posterior oropharyngeal erythema.  Eyes:     General: No scleral icterus.       Right eye: No discharge.        Left eye: No discharge.     Extraocular Movements: Extraocular movements intact.     Conjunctiva/sclera: Conjunctivae normal.     Pupils: Pupils are equal, round, and reactive to light.  Cardiovascular:     Rate and Rhythm: Normal rate and regular rhythm.  Pulmonary:     Effort: Pulmonary effort is normal. No respiratory distress.     Breath sounds: Normal breath sounds.  Abdominal:     General: Bowel sounds are normal.   Musculoskeletal:     Cervical back: No rigidity or tenderness.     Lumbar back: No tenderness or bony tenderness. Normal range of motion. Negative right straight leg raise test and negative left straight leg raise test.  Lymphadenopathy:     Cervical: No cervical adenopathy.  Skin:    General: Skin is warm and dry.  Neurological:     Mental Status: She is alert and oriented to person, place, and time.     Motor: No weakness.     Comments: Area decreased sensation to touch lateral right thigh  Psychiatric:        Mood and Affect: Mood normal.        Behavior: Behavior normal.      No results found for any visits on 01/31/23.    The 10-year ASCVD risk score (Arnett DK, et al., 2019) is: 6.9%    Assessment & Plan:   Need for immunization against influenza -     Flu Vaccine Trivalent High Dose (Fluad)  Immunization due -     Tdap vaccine greater than or equal to 7yo IM  Healthcare maintenance  Elevated glucose -     CBC -     Comprehensive metabolic panel -     Hemoglobin A1c -     Urinalysis, Routine w reflex microscopic  Elevated LDL cholesterol level -     Comprehensive metabolic panel -     Lipid panel  Meralgia paresthetica of right side -     Ambulatory referral to Sports Medicine  Elevated BP without diagnosis of hypertension -     Comprehensive metabolic panel  Vitamin D deficiency -     VITAMIN D 25 Hydroxy (Vit-D Deficiency, Fractures)    Return in about 3 months (around 05/02/2023).  Information was given on health maintenance and disease prevention.  Continue exercising.  Encouraged weight loss.  Information was given on preventing hypertension.  She will start to check her blood pressure at home.  Information was given on prediabetes.  Sports medicine referral with question of meralgia paresthetica.  Mliss Sax, MD

## 2023-02-01 ENCOUNTER — Telehealth: Payer: Self-pay | Admitting: Family Medicine

## 2023-02-01 NOTE — Telephone Encounter (Signed)
02/01/23 - Pt called asking if the provider can change her referral from Sports medicine, [place] to [facility] outpatient [facility] center, [place] since it is closer to her. Call back # [phone]

## 2023-02-05 DIAGNOSIS — R200 Anesthesia of skin: Secondary | ICD-10-CM | POA: Diagnosis not present

## 2023-02-05 DIAGNOSIS — M25551 Pain in right hip: Secondary | ICD-10-CM | POA: Diagnosis not present

## 2023-02-11 ENCOUNTER — Other Ambulatory Visit: Payer: Self-pay | Admitting: Cardiology

## 2023-02-11 NOTE — Therapy (Unsigned)
OUTPATIENT PHYSICAL THERAPY HIP/LUMBAR EVALUATION   Patient Name: ARALY GICK MRN: [id number] DOB:1955-11-19, 67 y.o., female Today's Date: 02/12/2023  END OF SESSION:  PT End of Session - 02/12/23 0850     Visit Number 1    Number of Visits 24    Date for PT Re-Evaluation 05/07/23    PT Start Time 0809    PT Stop Time 0850    PT Time Calculation (min) 41 min    Activity Tolerance Patient tolerated treatment well             Past Medical History:  Diagnosis Date   Deafness in right ear    Heart murmur    Hyperlipidemia    diet  controlled   Meniere disease    Post-operative nausea and vomiting    Vertigo    To ED Friday night 9-10- was given Meclazine - improved    Past Surgical History:  Procedure Laterality Date   ABDOMINAL HYSTERECTOMY     2 attempts/ unable to remove uterus/ had a lot bleeding until ablation was done   ABLATION     bowel reconstruction  2003   had adhesions/ removed part of small intestine   BREAST BIOPSY Left 2013   left/ precancerous lesion   CATARACT EXTRACTION     left eye   KIDNEY STONE SURGERY  2018   lithotripsy   TONSILLECTOMY     TUBAL LIGATION     ureter reconnection     URETERAL REIMPLANTION     Patient Active Problem List   Diagnosis Date Noted   Meralgia paresthetica of right side 01/31/2023   Elevated BP without diagnosis of hypertension 01/31/2023   Left Achilles tendinitis 01/19/2022   Left lower quadrant abdominal pain 08/29/2021   Intermittent small bowel obstruction due to adhesions (HCC) 08/29/2021   Constipation 08/29/2021   Elevated glucose 07/24/2021   Hospital discharge follow-up 07/24/2021   SBO (small bowel obstruction) (HCC) 07/11/2021   Elevated LFTs 07/11/2021   Hydronephrosis 07/11/2021   Vertigo 12/19/2020   Post-operative nausea and vomiting 12/19/2020   Deafness in right ear 12/19/2020   Vestibular hypofunction of both ears 06/03/2020   Mitral regurgitation mild based on the echo done in 2020  07/15/2019   Asymmetric SNHL (sensorineural hearing loss) 05/07/2019   Tinnitus of both ears 05/07/2019   Meniere's disease of left ear 02/10/2019   Elevated LDL cholesterol level 02/10/2019   Dyslipidemia 01/05/2019   Vitamin D deficiency 12/05/2018   DOE (dyspnea on exertion) 12/04/2018   Heart murmur 12/04/2018   Healthcare maintenance 12/04/2018    PCP: Dr Nadene Rubins  REFERRING PROVIDER: Dr Vernell Leep   REFERRING DIAG: pain R hip;anesthesia of skin  Rationale for Evaluation and Treatment: Rehabilitation  THERAPY DIAG:  Pain in right hip  Other symptoms and signs involving the musculoskeletal system  Muscle weakness (generalized)  Abnormal posture  ONSET DATE: 01/08/23  SUBJECTIVE:  SUBJECTIVE STATEMENT: Patient reports that she has had R hip pain for the past 4 weeks with no known injury. She did start some exercises about a month ago but is not sure that is what caused the pain. She is on her feet at work a lot. She has numbness in the R lateral hip down thing to knee - then is painful   PERTINENT HISTORY:  Achilles tendonitis; several abdominal surgeries last ~ 20 years; R kidney functions at 20%; bowel obstruction; Mnire's disease; heart murmer; deaf R ear; R hip dysplasia as infant?   PAIN:  Are you having pain? Yes: NPRS scale: 3/10; walking 4/10; standing > 2 hours 8/10  Pain location: lateral hip down thigh to knee  Pain description: burning pain  Aggravating factors: walking; standing  Relieving factors: stretching it out; sitting   PRECAUTIONS: None  RED FLAGS: None   WEIGHT BEARING RESTRICTIONS: No  FALLS:  Has patient fallen in last 6 months? No  LIVING ENVIRONMENT: Lives with: lives with their spouse Lives in: House/apartment Stairs: No Has following  equipment at home: None  OCCUPATION: [employer] - standing, walking on concrete floors 40 hours/week x 8 years; household chores; walks dog ~ 30 min on unlevel surfaces in the neighborhood  PLOF: Independent  PATIENT GOALS: get rid of the pain   NEXT MD VISIT: 04/30/23  OBJECTIVE:   DIAGNOSTIC FINDINGS:  None for hip or LB  PATIENT SURVEYS:  FOTO   SCREENING FOR RED FLAGS: Bowel or bladder incontinence: No Spinal tumors: No Cauda equina syndrome: No Compression fracture: No Abdominal aneurysm: No  COGNITION: Overall cognitive status: Within functional limits for tasks assessed     SENSATION: Intermittent numbness in the R lateral hip and thigh to knee    MUSCLE LENGTH: Hamstrings: Right 65 deg; Left 60 deg Thomas test: Right -10 deg; Left -5 deg  POSTURE: rounded shoulders, forward head, flexed trunk , and R LE in external rotation  PALPATION: Muscular tightness R >L hip flexors, quads; R posterior hip in gluts, piriformis  LUMBAR ROM:   AROM eval  Flexion 80%  Extension 30%  Right lateral flexion 70% discomfort  R hip   Left lateral flexion 75%  Right rotation 30%  Left rotation 30%   (Blank rows = not tested)  LOWER EXTREMITY ROM:     Active  Right eval Left eval  Hip flexion    Hip extension limited limited  Hip abduction    Hip adduction    Hip internal rotation    Hip external rotation    Knee flexion    Knee extension    Ankle dorsiflexion    Ankle plantarflexion    Ankle inversion    Ankle eversion     (Blank rows = not tested)  LOWER EXTREMITY MMT:    MMT Right eval Left eval  Hip flexion 4 4+  Hip extension 4 4  Hip abduction 4- 4  Hip adduction    Hip internal rotation    Hip external rotation    Knee flexion    Knee extension    Ankle dorsiflexion    Ankle plantarflexion    Ankle inversion    Ankle eversion     (Blank rows = not tested)  LUMBAR SPECIAL TESTS:  Straight leg raise test:  Negative  FUNCTIONAL TESTS:  5 times sit to stand:  SLS R 2-3 sec; L 3-4 sec   GAIT: Distance walked: 40 Assistive device utilized: None Level of  assistance: Complete Independence Comments: antalgic gait with R LE in some ER - decreased stance phase on R  [facility] Adult PT Treatment:                                                DATE: 02/11/23 Therapeutic Exercise: Supine Piriformis stretch travell 30 sec x 2  Hip flexor stretch 30 sec x 2 R/L  Prone Press up 2-3 sec x 5 Quad stretch R 30 sec x 2  Sitting  Hip flexor stretch 30 sec x 2 R/L  Standing Hip extension 3 sec x 10 R/L  Manual Therapy: Consider DN - not discussed  Therapeutic Activity: Myofacial ball release work R hip anterior/posterior Modalities: TENS for pain management as needed  Self Care: Modify recliner Avoid crossing legs or ankles    PATIENT EDUCATION:  Education details: POC; HEP  Person educated: Patient Education method: Explanation, Demonstration, Tactile cues, Verbal cues, and Handouts Education comprehension: verbalized understanding, returned demonstration, verbal cues required, tactile cues required, and needs further education  HOME EXERCISE PROGRAM: Access Code: [email or web address] URL: [email or web address] Date: 02/12/2023 Prepared by: Corlis Leak  Exercises - Hip Flexor Stretch at [facility] of Bed  - 2 x daily - 7 x weekly - 1 sets - 3 reps - 30 sec  hold - Seated Hip Flexor Stretch  - 2 x daily - 7 x weekly - 1 sets - 3 reps - 30 sec  hold - Supine Piriformis Stretch with Leg Straight  - 2 x daily - 7 x weekly - 1 sets - 3 reps - 30 sec  hold - Prone Quadriceps Stretch with Strap  - 2 x daily - 7 x weekly - 1 sets - 3 reps - 30 sec  hold - Prone Press Up  - 2 x daily - 7 x weekly - 1 sets - 10 reps - 2-3 sec  hold - Standing Hip Extension with Counter Support  - 2 x daily - 7 x weekly - 1-2 sets - 10 reps - 3-5 sec  hold - Standing Piriformis Release with Ball at Wall  - 2 x daily - 7 x  weekly - 30-60 sec  hold  ASSESSMENT:  CLINICAL IMPRESSION: Patient is a 67 y.o. female who was seen today for physical therapy evaluation and treatment for R hip pain with numbness in the R hip area. She presents with poor posture and alignment; limited standing and walking tolerance; decreased lumbar and hip mobility/ROM; LE weakness; muscular tightness to palpation; pain on a daily basis. Patient will benefit from PT to address problems identified.   OBJECTIVE IMPAIRMENTS: Abnormal gait, decreased activity tolerance, decreased balance, decreased endurance, decreased mobility, decreased ROM, decreased strength, hypomobility, increased fascial restrictions, increased muscle spasms, impaired flexibility, improper body mechanics, postural dysfunction, and pain.   ACTIVITY LIMITATIONS: carrying, lifting, bending, standing, squatting, stairs, and locomotion level  PARTICIPATION LIMITATIONS: meal prep, cleaning, laundry, driving, shopping, community activity, and occupation  PERSONAL FACTORS: Age, Fitness, Past/current experiences, and Time since onset of injury/illness/exacerbation are also affecting patient's functional outcome.   REHAB POTENTIAL: Good  CLINICAL DECISION MAKING: Stable/uncomplicated  EVALUATION COMPLEXITY: Low   GOALS: Goals reviewed with patient? Yes  SHORT TERM GOALS: Target date: 03/25/2023  Independent in initial HEP  Baseline: Goal status: INITIAL  2.  Increased standing and walking tolerance to 2-3 hours  Baseline:  Goal status: INITIAL   LONG TERM GOALS: Target date: 05/06/2023   Increase hip extension to 0 to (+)5-10 degrees  Baseline:  Goal status: INITIAL  2.  Increase LE strength to 4+/5 to 5/5  Baseline:  Goal status: INITIAL  3.  Improve functional standing time to 3 + hours for work  Baseline:  Goal status: INITIAL  4.  Decrease R hip pain by 75-100% allowing patient to return to all normal functional activities  Baseline:  Goal status:  INITIAL  5.  Independent in HEP, including aquatic program as indicated  Baseline:  Goal status: INITIAL   PLAN:  PT FREQUENCY: 2x/week  PT DURATION: 12 weeks  PLANNED INTERVENTIONS: Therapeutic exercises, Therapeutic activity, Neuromuscular re-education, Balance training, Gait training, Patient/Family education, Self Care, Joint mobilization, Aquatic Therapy, Dry Needling, Electrical stimulation, Spinal mobilization, Cryotherapy, Moist heat, Taping, Traction, Ultrasound, Ionotophoresis 4mg /ml Dexamethasone, Manual therapy, and Re-evaluation.  PLAN FOR NEXT SESSION: review and progress exercises; continue with back care education and instruction; manualwork, DN, modalities as indicated    Val Riles, PT 02/12/2023, 12:39 PM

## 2023-02-12 ENCOUNTER — Other Ambulatory Visit: Payer: Self-pay

## 2023-02-12 ENCOUNTER — Ambulatory Visit
Payer: Federal, State, Local not specified - PPO | Attending: Family Medicine | Admitting: Rehabilitative and Restorative Service Providers"

## 2023-02-12 DIAGNOSIS — R29898 Other symptoms and signs involving the musculoskeletal system: Secondary | ICD-10-CM

## 2023-02-12 DIAGNOSIS — R200 Anesthesia of skin: Secondary | ICD-10-CM | POA: Insufficient documentation

## 2023-02-12 DIAGNOSIS — M25551 Pain in right hip: Secondary | ICD-10-CM | POA: Diagnosis not present

## 2023-02-12 DIAGNOSIS — M6281 Muscle weakness (generalized): Secondary | ICD-10-CM | POA: Diagnosis not present

## 2023-02-12 DIAGNOSIS — R293 Abnormal posture: Secondary | ICD-10-CM | POA: Diagnosis not present

## 2023-02-14 ENCOUNTER — Ambulatory Visit: Payer: Federal, State, Local not specified - PPO

## 2023-02-14 DIAGNOSIS — M6281 Muscle weakness (generalized): Secondary | ICD-10-CM

## 2023-02-14 DIAGNOSIS — M25551 Pain in right hip: Secondary | ICD-10-CM | POA: Diagnosis not present

## 2023-02-14 DIAGNOSIS — R29898 Other symptoms and signs involving the musculoskeletal system: Secondary | ICD-10-CM

## 2023-02-14 DIAGNOSIS — R293 Abnormal posture: Secondary | ICD-10-CM | POA: Diagnosis not present

## 2023-02-14 DIAGNOSIS — R200 Anesthesia of skin: Secondary | ICD-10-CM | POA: Diagnosis not present

## 2023-02-14 NOTE — Therapy (Signed)
OUTPATIENT PHYSICAL THERAPY HIP/LUMBAR TREATMENT   Patient Name: Heather Villegas MRN: [id number] DOB:1955/10/02, 67 y.o., female Today's Date: 02/14/2023  END OF SESSION:  PT End of Session - 02/14/23 0842     Visit Number 2    Number of Visits 24    Date for PT Re-Evaluation 05/07/23    Authorization Type BC[employer]    PT Start Time 0845    PT Stop Time 0925    PT Time Calculation (min) 40 min    Activity Tolerance Patient tolerated treatment well    Behavior During Therapy [facility] for tasks assessed/performed             Past Medical History:  Diagnosis Date   Deafness in right ear    Heart murmur    Hyperlipidemia    diet  controlled   Meniere disease    Post-operative nausea and vomiting    Vertigo    To ED Friday night 9-10- was given Meclazine - improved    Past Surgical History:  Procedure Laterality Date   ABDOMINAL HYSTERECTOMY     2 attempts/ unable to remove uterus/ had a lot bleeding until ablation was done   ABLATION     bowel reconstruction  2003   had adhesions/ removed part of small intestine   BREAST BIOPSY Left 2013   left/ precancerous lesion   CATARACT EXTRACTION     left eye   KIDNEY STONE SURGERY  2018   lithotripsy   TONSILLECTOMY     TUBAL LIGATION     ureter reconnection     URETERAL REIMPLANTION     Patient Active Problem List   Diagnosis Date Noted   Meralgia paresthetica of right side 01/31/2023   Elevated BP without diagnosis of hypertension 01/31/2023   Left Achilles tendinitis 01/19/2022   Left lower quadrant abdominal pain 08/29/2021   Intermittent small bowel obstruction due to adhesions (HCC) 08/29/2021   Constipation 08/29/2021   Elevated glucose 07/24/2021   Hospital discharge follow-up 07/24/2021   SBO (small bowel obstruction) (HCC) 07/11/2021   Elevated LFTs 07/11/2021   Hydronephrosis 07/11/2021   Vertigo 12/19/2020   Post-operative nausea and vomiting 12/19/2020   Deafness in right ear 12/19/2020   Vestibular  hypofunction of both ears 06/03/2020   Mitral regurgitation mild based on the echo done in 2020 07/15/2019   Asymmetric SNHL (sensorineural hearing loss) 05/07/2019   Tinnitus of both ears 05/07/2019   Meniere's disease of left ear 02/10/2019   Elevated LDL cholesterol level 02/10/2019   Dyslipidemia 01/05/2019   Vitamin D deficiency 12/05/2018   DOE (dyspnea on exertion) 12/04/2018   Heart murmur 12/04/2018   Healthcare maintenance 12/04/2018    PCP: Dr Nadene Rubins  REFERRING PROVIDER: Dr Vernell Leep   REFERRING DIAG: pain R hip;anesthesia of skin  Rationale for Evaluation and Treatment: Rehabilitation  THERAPY DIAG:  Pain in right hip  Other symptoms and signs involving the musculoskeletal system  Muscle weakness (generalized)  ONSET DATE: 01/08/23  SUBJECTIVE:  SUBJECTIVE STATEMENT: Patient reports she is able to tolerate an hour of standing before pain/tingling starts; states she has no pain currently since it is morning and she has not been standing at work.   PERTINENT HISTORY:  Achilles tendonitis; several abdominal surgeries last ~ 20 years; R kidney functions at 20%; bowel obstruction; Mnire's disease; heart murmer; deaf R ear; R hip dysplasia as infant?   PAIN:  Are you having pain? Yes: NPRS scale: 3/10; walking 4/10; standing > 2 hours 8/10  Pain location: lateral hip down thigh to knee  Pain description: burning pain  Aggravating factors: walking; standing  Relieving factors: stretching it out; sitting   PRECAUTIONS: None  RED FLAGS: None   WEIGHT BEARING RESTRICTIONS: No  FALLS:  Has patient fallen in last 6 months? 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; household chores; walks dog ~ 30 min on unlevel surfaces in the neighborhood  PLOF: Independent  PATIENT GOALS: get rid of the pain   NEXT MD VISIT: 04/30/23  OBJECTIVE:   DIAGNOSTIC FINDINGS:  None for hip or LB  PATIENT SURVEYS:  FOTO   SCREENING FOR RED FLAGS: Bowel or bladder incontinence: No Spinal tumors: No Cauda equina syndrome: No Compression fracture: No Abdominal aneurysm: No  COGNITION: Overall cognitive status: Within functional limits for tasks assessed     SENSATION: Intermittent numbness in the R lateral hip and thigh to knee    MUSCLE LENGTH: Hamstrings: Right 65 deg; Left 60 deg Thomas test: Right -10 deg; Left -5 deg  POSTURE: rounded shoulders, forward head, flexed trunk , and R LE in external rotation  PALPATION: Muscular tightness R >L hip flexors, quads; R posterior hip in gluts, piriformis  LUMBAR ROM:   AROM eval  Flexion 80%  Extension 30%  Right lateral flexion 70% discomfort  R hip   Left lateral flexion 75%  Right rotation 30%  Left rotation 30%   (Blank rows = not tested)  LOWER EXTREMITY ROM:     Active  Right eval Left eval  Hip flexion    Hip extension limited limited  Hip abduction    Hip adduction    Hip internal rotation    Hip external rotation    Knee flexion    Knee extension    Ankle dorsiflexion    Ankle plantarflexion    Ankle inversion    Ankle eversion     (Blank rows = not tested)  LOWER EXTREMITY MMT:    MMT Right eval Left eval  Hip flexion 4 4+  Hip extension 4 4  Hip abduction 4- 4  Hip adduction    Hip internal rotation    Hip external rotation    Knee flexion    Knee extension    Ankle dorsiflexion    Ankle plantarflexion    Ankle inversion    Ankle eversion     (Blank rows = not tested)  LUMBAR SPECIAL TESTS:  Straight leg raise test: Negative  FUNCTIONAL TESTS:  5  times sit to stand:  SLS R 2-3 sec; L 3-4 sec   GAIT: Distance walked: 40 Assistive device utilized: None Level of assistance: Complete Independence Comments: antalgic gait with R LE in some ER - decreased stance phase on R   [facility] Adult PT Treatment:  DATE: 02/14/2023 Therapeutic Exercise: Prone:  Femoral nerve glide Quad stretch w/strap 3x30" R hip IR/ER AROM Hip extension leg lifts 2x10 (B) Supine: Travell piriformis stretch 2x30" ITB stretch w/strap 2x30" Hip flexor stretch 2x30" Seated: Hip add iso ball squeeze + postural cues to improve core stabilization Unilateral hip abd press with foot stabilization + GTB above knees x10 (B) Hip flexor stretch on corner  --> added overhead arm stretch Standing: Squats with seat tap on 2 pillows + GTB above knees Squat side step + GTB above knees --> stepping down/back along table edge Side Lying propped on elbow with myofascial ball at R glute for self-massage --> Standing focused on anterior/lateral hip      [facility] Adult PT Treatment:                                                DATE: 02/11/23 Therapeutic Exercise: Supine Piriformis stretch travell 30 sec x 2  Hip flexor stretch 30 sec x 2 R/L  Prone Press up 2-3 sec x 5 Quad stretch R 30 sec x 2  Sitting  Hip flexor stretch 30 sec x 2 R/L  Standing Hip extension 3 sec x 10 R/L  Manual Therapy: Consider DN - not discussed  Therapeutic Activity: Myofacial ball release work R hip anterior/posterior Modalities: TENS for pain management as needed  Self Care: Modify recliner Avoid crossing legs or ankles    PATIENT EDUCATION:  Education details: DN handout Person educated: Patient Education method: Explanation, Demonstration, Tactile cues, Verbal cues, and Handouts Education comprehension: verbalized understanding, returned demonstration, verbal cues required, tactile cues required, and needs further education  HOME  EXERCISE PROGRAM: Access Code: [email or web address] URL: [email or web address] Date: 02/12/2023 Prepared by: Corlis Leak  Exercises - Hip Flexor Stretch at Edge of Bed  - 2 x daily - 7 x weekly - 1 sets - 3 reps - 30 sec  hold - Seated Hip Flexor Stretch  - 2 x daily - 7 x weekly - 1 sets - 3 reps - 30 sec  hold - Supine Piriformis Stretch with Leg Straight  - 2 x daily - 7 x weekly - 1 sets - 3 reps - 30 sec  hold - Prone Quadriceps Stretch with Strap  - 2 x daily - 7 x weekly - 1 sets - 3 reps - 30 sec  hold - Prone Press Up  - 2 x daily - 7 x weekly - 1 sets - 10 reps - 2-3 sec  hold - Standing Hip Extension with Counter Support  - 2 x daily - 7 x weekly - 1-2 sets - 10 reps - 3-5 sec  hold - Standing Piriformis Release with Ball at Wall  - 2 x daily - 7 x weekly - 30-60 sec  hold  ASSESSMENT:  CLINICAL IMPRESSION: Hip strengthening continued, providing cues as needed for proper form and pelvic stability. Noted knee valgus during eccentric phase of squat; resistance band added to improve postural awareness. Handout provided with information on dry needling treatment included in POC.  EVAL: Patient is a 67 y.o. female who was seen today for physical therapy evaluation and treatment for R hip pain with numbness in the R hip area. She presents with poor posture and alignment; limited standing and walking tolerance; decreased lumbar and hip mobility/ROM; LE weakness; muscular tightness to palpation; pain on a daily  basis. Patient will benefit from PT to address problems identified.   OBJECTIVE IMPAIRMENTS: Abnormal gait, decreased activity tolerance, decreased balance, decreased endurance, decreased mobility, decreased ROM, decreased strength, hypomobility, increased fascial restrictions, increased muscle spasms, impaired flexibility, improper body mechanics, postural dysfunction, and pain.   ACTIVITY LIMITATIONS: carrying, lifting, bending, standing, squatting, stairs, and locomotion  level  PARTICIPATION LIMITATIONS: meal prep, cleaning, laundry, driving, shopping, community activity, and occupation  PERSONAL FACTORS: Age, Fitness, Past/current experiences, and Time since onset of injury/illness/exacerbation are also affecting patient's functional outcome.   REHAB POTENTIAL: Good  CLINICAL DECISION MAKING: Stable/uncomplicated  EVALUATION COMPLEXITY: Low   GOALS: Goals reviewed with patient? Yes  SHORT TERM GOALS: Target date: 03/25/2023  Independent in initial HEP  Baseline: Goal status: INITIAL  2.  Increased standing and walking tolerance to 2-3 hours  Baseline:  Goal status: INITIAL   LONG TERM GOALS: Target date: 05/06/2023  Increase hip extension to 0 to (+)5-10 degrees  Baseline:  Goal status: INITIAL  2.  Increase LE strength to 4+/5 to 5/5  Baseline:  Goal status: INITIAL  3.  Improve functional standing time to 3 + hours for work  Baseline:  Goal status: INITIAL  4.  Decrease R hip pain by 75-100% allowing patient to return to all normal functional activities  Baseline:  Goal status: INITIAL  5.  Independent in HEP, including aquatic program as indicated  Baseline:  Goal status: INITIAL   PLAN:  PT FREQUENCY: 2x/week  PT DURATION: 12 weeks  PLANNED INTERVENTIONS: Therapeutic exercises, Therapeutic activity, Neuromuscular re-education, Balance training, Gait training, Patient/Family education, Self Care, Joint mobilization, Aquatic Therapy, Dry Needling, Electrical stimulation, Spinal mobilization, Cryotherapy, Moist heat, Taping, Traction, Ultrasound, Ionotophoresis 4mg /ml Dexamethasone, Manual therapy, and Re-evaluation.  PLAN FOR NEXT SESSION: Progress exercises; continue with back care education and instruction; manualwork, DN, modalities as indicated    Sanjuana Mae, PTA 02/14/2023, 9:25 AM

## 2023-02-19 ENCOUNTER — Encounter: Payer: Self-pay | Admitting: Rehabilitative and Restorative Service Providers"

## 2023-02-19 ENCOUNTER — Ambulatory Visit
Payer: Federal, State, Local not specified - PPO | Attending: Family Medicine | Admitting: Rehabilitative and Restorative Service Providers"

## 2023-02-19 DIAGNOSIS — R600 Localized edema: Secondary | ICD-10-CM | POA: Insufficient documentation

## 2023-02-19 DIAGNOSIS — M25672 Stiffness of left ankle, not elsewhere classified: Secondary | ICD-10-CM | POA: Diagnosis not present

## 2023-02-19 DIAGNOSIS — M25551 Pain in right hip: Secondary | ICD-10-CM | POA: Diagnosis not present

## 2023-02-19 DIAGNOSIS — R293 Abnormal posture: Secondary | ICD-10-CM | POA: Insufficient documentation

## 2023-02-19 DIAGNOSIS — M6281 Muscle weakness (generalized): Secondary | ICD-10-CM | POA: Insufficient documentation

## 2023-02-19 DIAGNOSIS — M25572 Pain in left ankle and joints of left foot: Secondary | ICD-10-CM | POA: Diagnosis not present

## 2023-02-19 DIAGNOSIS — R29898 Other symptoms and signs involving the musculoskeletal system: Secondary | ICD-10-CM | POA: Insufficient documentation

## 2023-02-19 DIAGNOSIS — R2689 Other abnormalities of gait and mobility: Secondary | ICD-10-CM | POA: Insufficient documentation

## 2023-02-19 NOTE — Therapy (Signed)
OUTPATIENT PHYSICAL THERAPY HIP/LUMBAR TREATMENT   Patient Name: Heather Villegas MRN: [id number] DOB:1956/04/04, 67 y.o., female Today's Date: 02/19/2023  END OF SESSION:  PT End of Session - 02/19/23 0759     Visit Number 3    Number of Visits 24    Date for PT Re-Evaluation 05/07/23    Authorization Type [employer] $35 copay/50 visits per yr w/ OT/ST    PT Start Time 0759    PT Stop Time 0845    PT Time Calculation (min) 46 min    Activity Tolerance Patient tolerated treatment well             Past Medical History:  Diagnosis Date   Deafness in right ear    Heart murmur    Hyperlipidemia    diet  controlled   Meniere disease    Post-operative nausea and vomiting    Vertigo    To ED Friday night 9-10- was given Meclazine - improved    Past Surgical History:  Procedure Laterality Date   ABDOMINAL HYSTERECTOMY     2 attempts/ unable to remove uterus/ had a lot bleeding until ablation was done   ABLATION     bowel reconstruction  2003   had adhesions/ removed part of small intestine   BREAST BIOPSY Left 2013   left/ precancerous lesion   CATARACT EXTRACTION     left eye   KIDNEY STONE SURGERY  2018   lithotripsy   TONSILLECTOMY     TUBAL LIGATION     ureter reconnection     URETERAL REIMPLANTION     Patient Active Problem List   Diagnosis Date Noted   Meralgia paresthetica of right side 01/31/2023   Elevated BP without diagnosis of hypertension 01/31/2023   Left Achilles tendinitis 01/19/2022   Left lower quadrant abdominal pain 08/29/2021   Intermittent small bowel obstruction due to adhesions (HCC) 08/29/2021   Constipation 08/29/2021   Elevated glucose 07/24/2021   Hospital discharge follow-up 07/24/2021   SBO (small bowel obstruction) (HCC) 07/11/2021   Elevated LFTs 07/11/2021   Hydronephrosis 07/11/2021   Vertigo 12/19/2020   Post-operative nausea and vomiting 12/19/2020   Deafness in right ear 12/19/2020   Vestibular hypofunction of both ears  06/03/2020   Mitral regurgitation mild based on the echo done in 2020 07/15/2019   Asymmetric SNHL (sensorineural hearing loss) 05/07/2019   Tinnitus of both ears 05/07/2019   Meniere's disease of left ear 02/10/2019   Elevated LDL cholesterol level 02/10/2019   Dyslipidemia 01/05/2019   Vitamin D deficiency 12/05/2018   DOE (dyspnea on exertion) 12/04/2018   Heart murmur 12/04/2018   Healthcare maintenance 12/04/2018    PCP: Dr Nadene Rubins  REFERRING PROVIDER: Dr Vernell Leep   REFERRING DIAG: pain R hip;anesthesia of skin  Rationale for Evaluation and Treatment: Rehabilitation  THERAPY DIAG:  Pain in right hip  Other symptoms and signs involving the musculoskeletal system  Muscle weakness (generalized)  Abnormal posture  ONSET DATE: 01/08/23  SUBJECTIVE:  SUBJECTIVE STATEMENT: Patient reports that her hip is feeling some better. She is working on her exercises at home. She is able to tolerate an hour of standing before pain/tingling starts. Standing is worse than walking   PERTINENT HISTORY:  Achilles tendonitis; several abdominal surgeries last ~ 20 years; R kidney functions at 20%; bowel obstruction; Mnire's disease; heart murmer; deaf R ear; R hip dysplasia as infant?   PAIN:  Are you having pain? Yes: NPRS scale: 0/10 this morning (walking 4/10; standing > 2 hours 8/10)  Pain location: lateral hip down thigh to knee  Pain description: burning pain  Aggravating factors: walking; standing  Relieving factors: stretching it out; sitting   PRECAUTIONS: None   WEIGHT BEARING RESTRICTIONS: No  FALLS:  Has patient fallen in last 6 months? 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; household chores; walks dog ~ 30 min on unlevel surfaces in the neighborhood  PLOF: Independent  PATIENT GOALS: get rid of the pain   NEXT MD VISIT: 04/30/23  OBJECTIVE:   DIAGNOSTIC FINDINGS:  None for hip or LB  PATIENT SURVEYS:  FOTO   SENSATION: Intermittent numbness in the R lateral hip and thigh to knee    MUSCLE LENGTH: Hamstrings: Right 65 deg; Left 60 deg Thomas test: Right -10 deg; Left -5 deg  POSTURE: rounded shoulders, forward head, flexed trunk , and R LE in external rotation  PALPATION: Muscular tightness R >L hip flexors, quads; R posterior hip in gluts, piriformis  LUMBAR ROM:   AROM eval  Flexion 80%  Extension 30%  Right lateral flexion 70% discomfort  R hip   Left lateral flexion 75%  Right rotation 30%  Left rotation 30%   (Blank rows = not tested)  LOWER EXTREMITY ROM:     Active  Right eval Left eval  Hip flexion    Hip extension limited limited  Hip abduction    Hip adduction    Hip internal rotation    Hip external rotation    Knee flexion    Knee extension    Ankle dorsiflexion    Ankle plantarflexion    Ankle inversion    Ankle eversion     (Blank rows = not tested)  LOWER EXTREMITY MMT:    MMT Right eval Left eval  Hip flexion 4 4+  Hip extension 4 4  Hip abduction 4- 4  Hip adduction    Hip internal rotation    Hip external rotation    Knee flexion    Knee extension    Ankle dorsiflexion    Ankle plantarflexion    Ankle inversion    Ankle eversion     (Blank rows = not tested)  LUMBAR SPECIAL TESTS:  Straight leg raise test: Negative  FUNCTIONAL TESTS:  5 times sit to stand:  SLS R 2-3 sec; L 3-4 sec   GAIT: Distance walked: 40 Assistive device utilized: None Level of assistance: Complete Independence Comments: antalgic gait with R LE in some ER - decreased stance phase on R  [facility] Adult PT  Treatment:  DATE: 02/19/2023 Therapeutic Exercise: Nustep L 6 x 5 min  Prone:  Femoral nerve glide Quad stretch w/strap 3x30" R hip IR/ER AROM Hip extension leg lifts 2x10 (B) Supine: Travell piriformis stretch 3x30" ITB stretch w/strap 2x30" Hip flexor stretch 3x30" Seated: Hip add iso ball squeeze 3 sec x 10 x 2  Hip flexor stretch 30 sec x 3 L/R Hip flexor stretch with reaching overhead 30 sec x 2 Standing: Row blue TB 3 sec x 10 x 2 Antirotation green TB 3 sec x 10  Sit to stand  with seat tap from elevated mat table blue TB above knees 10 x 2 Squat side step with blue TB above knees 8 ft x 5 stepping down/back along table edge Myofacial ball release standing working through the anterior and posterior hip    DATE: 02/14/2023 Therapeutic Exercise: Prone:  Femoral nerve glide Quad stretch w/strap 3x30" R hip IR/ER AROM Hip extension leg lifts 2x10 (B) Supine: Travell piriformis stretch 2x30" ITB stretch w/strap 2x30" Hip flexor stretch 2x30" Seated: Hip add iso ball squeeze + postural cues to improve core stabilization Unilateral hip abd press with foot stabilization + GTB above knees x10 (B) Hip flexor stretch on corner  --> added overhead arm stretch Standing: Squats with seat tap on 2 pillows + GTB above knees Squat side step + GTB above knees --> stepping down/back along table edge Side Lying propped on elbow with myofascial ball at R glute for self-massage --> Standing focused on anterior/lateral hip    [facility] Adult PT Treatment:                                                DATE: 02/11/23 Therapeutic Exercise: Supine Piriformis stretch travell 30 sec x 2  Hip flexor stretch 30 sec x 2 R/L  Prone Press up 2-3 sec x 5 Quad stretch R 30 sec x 2  Sitting  Hip flexor stretch 30 sec x 2 R/L  Standing Hip extension 3 sec x 10 R/L  Manual Therapy: Consider DN - not discussed  Therapeutic Activity: Myofacial ball  release work R hip anterior/posterior Modalities: TENS for pain management as needed  Self Care: Modify recliner Avoid crossing legs or ankles    PATIENT EDUCATION:  Education details: DN handout Person educated: Patient Education method: Explanation, Demonstration, Tactile cues, Verbal cues, and Handouts Education comprehension: verbalized understanding, returned demonstration, verbal cues required, tactile cues required, and needs further education  HOME EXERCISE PROGRAM:  Access Code: [email or web address] URL: [email or web address] Date: 02/19/2023 Prepared by: Corlis Leak  Exercises - Hip Flexor Stretch at [facility] of Bed  - 2 x daily - 7 x weekly - 1 sets - 3 reps - 30 sec  hold - Seated Hip Flexor Stretch  - 2 x daily - 7 x weekly - 1 sets - 3 reps - 30 sec  hold - Supine Piriformis Stretch with Leg Straight  - 2 x daily - 7 x weekly - 1 sets - 3 reps - 30 sec  hold - Prone Quadriceps Stretch with Strap  - 2 x daily - 7 x weekly - 1 sets - 3 reps - 30 sec  hold - Prone Press Up  - 2 x daily - 7 x weekly - 1 sets - 10 reps - 2-3 sec  hold - Standing Hip Extension with Counter Support  -  2 x daily - 7 x weekly - 1-2 sets - 10 reps - 3-5 sec  hold - Standing Piriformis Release with Ball at [employer]  - 2 x daily - 7 x weekly - 30-60 sec  hold - Standing Bilateral Low Shoulder Row with Anchored Resistance  - 2 x daily - 7 x weekly - 1-3 sets - 10 reps - 2-3 sec  hold - Anti-Rotation Lateral Stepping with Press  - 2 x daily - 7 x weekly - 1-2 sets - 10 reps - 2-3 sec  hold - Side Stepping with Resistance at Thighs  - 1 x daily - 7 x weekly ASSESSMENT:  CLINICAL IMPRESSION: Patient reports that hip is feeling some better and she is doing well with exercises at home. Treatment consisted of core and hip strengthening. Will consider DN for piriformis and gluts at next visit.    EVAL: Patient is a 67 y.o. female who was seen today for physical therapy evaluation and treatment for R hip  pain with numbness in the R hip area. She presents with poor posture and alignment; limited standing and walking tolerance; decreased lumbar and hip mobility/ROM; LE weakness; muscular tightness to palpation; pain on a daily basis. Patient will benefit from PT to address problems identified.   GOALS: Goals reviewed with patient? Yes  SHORT TERM GOALS: Target date: 03/25/2023  Independent in initial HEP  Baseline: Goal status: INITIAL  2.  Increased standing and walking tolerance to 2-3 hours  Baseline:  Goal status: INITIAL   LONG TERM GOALS: Target date: 05/06/2023  Increase hip extension to 0 to (+)5-10 degrees  Baseline:  Goal status: INITIAL  2.  Increase LE strength to 4+/5 to 5/5  Baseline:  Goal status: INITIAL  3.  Improve functional standing time to 3 + hours for work  Baseline:  Goal status: INITIAL  4.  Decrease R hip pain by 75-100% allowing patient to return to all normal functional activities  Baseline:  Goal status: INITIAL  5.  Independent in HEP, including aquatic program as indicated  Baseline:  Goal status: INITIAL   PLAN:  PT FREQUENCY: 2x/week  PT DURATION: 12 weeks  PLANNED INTERVENTIONS: Therapeutic exercises, Therapeutic activity, Neuromuscular re-education, Balance training, Gait training, Patient/Family education, Self Care, Joint mobilization, Aquatic Therapy, Dry Needling, Electrical stimulation, Spinal mobilization, Cryotherapy, Moist heat, Taping, Traction, Ultrasound, Ionotophoresis 4mg /ml Dexamethasone, Manual therapy, and Re-evaluation.  PLAN FOR NEXT SESSION: Progress exercises; continue with back care education and instruction; manualwork, DN, modalities as indicated    Breyden Jeudy Rober Minion, PT 02/19/2023, 8:00 AM

## 2023-02-21 ENCOUNTER — Encounter: Payer: Self-pay | Admitting: Rehabilitative and Restorative Service Providers"

## 2023-02-21 ENCOUNTER — Ambulatory Visit: Payer: Federal, State, Local not specified - PPO | Admitting: Rehabilitative and Restorative Service Providers"

## 2023-02-21 DIAGNOSIS — R600 Localized edema: Secondary | ICD-10-CM | POA: Diagnosis not present

## 2023-02-21 DIAGNOSIS — M25572 Pain in left ankle and joints of left foot: Secondary | ICD-10-CM | POA: Diagnosis not present

## 2023-02-21 DIAGNOSIS — R293 Abnormal posture: Secondary | ICD-10-CM | POA: Diagnosis not present

## 2023-02-21 DIAGNOSIS — M6281 Muscle weakness (generalized): Secondary | ICD-10-CM | POA: Diagnosis not present

## 2023-02-21 DIAGNOSIS — M25551 Pain in right hip: Secondary | ICD-10-CM | POA: Diagnosis not present

## 2023-02-21 DIAGNOSIS — R29898 Other symptoms and signs involving the musculoskeletal system: Secondary | ICD-10-CM | POA: Diagnosis not present

## 2023-02-21 DIAGNOSIS — M25672 Stiffness of left ankle, not elsewhere classified: Secondary | ICD-10-CM | POA: Diagnosis not present

## 2023-02-21 DIAGNOSIS — R2689 Other abnormalities of gait and mobility: Secondary | ICD-10-CM | POA: Diagnosis not present

## 2023-02-21 NOTE — Therapy (Signed)
OUTPATIENT PHYSICAL THERAPY HIP/LUMBAR TREATMENT   Patient Name: Heather Villegas MRN: [id number] DOB:1955/11/19, 67 y.o., female Today's Date: 02/21/2023  END OF SESSION:  PT End of Session - 02/21/23 0803     Visit Number 4    Number of Visits 24    Authorization Type [employer] $35 copay/50 visits per yr w/ OT/ST    PT Start Time 0800    PT Stop Time 0845    PT Time Calculation (min) 45 min             Past Medical History:  Diagnosis Date   Deafness in right ear    Heart murmur    Hyperlipidemia    diet  controlled   Meniere disease    Post-operative nausea and vomiting    Vertigo    To ED Friday night 9-10- was given Meclazine - improved    Past Surgical History:  Procedure Laterality Date   ABDOMINAL HYSTERECTOMY     2 attempts/ unable to remove uterus/ had a lot bleeding until ablation was done   ABLATION     bowel reconstruction  2003   had adhesions/ removed part of small intestine   BREAST BIOPSY Left 2013   left/ precancerous lesion   CATARACT EXTRACTION     left eye   KIDNEY STONE SURGERY  2018   lithotripsy   TONSILLECTOMY     TUBAL LIGATION     ureter reconnection     URETERAL REIMPLANTION     Patient Active Problem List   Diagnosis Date Noted   Meralgia paresthetica of right side 01/31/2023   Elevated BP without diagnosis of hypertension 01/31/2023   Left Achilles tendinitis 01/19/2022   Left lower quadrant abdominal pain 08/29/2021   Intermittent small bowel obstruction due to adhesions (HCC) 08/29/2021   Constipation 08/29/2021   Elevated glucose 07/24/2021   Hospital discharge follow-up 07/24/2021   SBO (small bowel obstruction) (HCC) 07/11/2021   Elevated LFTs 07/11/2021   Hydronephrosis 07/11/2021   Vertigo 12/19/2020   Post-operative nausea and vomiting 12/19/2020   Deafness in right ear 12/19/2020   Vestibular hypofunction of both ears 06/03/2020   Mitral regurgitation mild based on the echo done in 2020 07/15/2019   Asymmetric SNHL  (sensorineural hearing loss) 05/07/2019   Tinnitus of both ears 05/07/2019   Meniere's disease of left ear 02/10/2019   Elevated LDL cholesterol level 02/10/2019   Dyslipidemia 01/05/2019   Vitamin D deficiency 12/05/2018   DOE (dyspnea on exertion) 12/04/2018   Heart murmur 12/04/2018   Healthcare maintenance 12/04/2018    PCP: Dr Nadene Rubins  REFERRING PROVIDER: Dr Vernell Leep   REFERRING DIAG: pain R hip;anesthesia of skin  Rationale for Evaluation and Treatment: Rehabilitation  THERAPY DIAG:  Pain in right hip  Other symptoms and signs involving the musculoskeletal system  Muscle weakness (generalized)  Abnormal posture  ONSET DATE: 01/08/23  SUBJECTIVE:  SUBJECTIVE STATEMENT: Patient reports that she slipped and fell on a wet floor 02/19/23 striking her L arm on a chair and landing on her tailbone. Patient reports that her hip is feeling okay. She is working on her exercises at home but did not exercise yesterday because she was sore. She is able to tolerate an hour of standing before pain/tingling starts. Standing is worse than walking   PERTINENT HISTORY:  Achilles tendonitis; several abdominal surgeries last ~ 20 years; R kidney functions at 20%; bowel obstruction; Mnire's disease; heart murmer; deaf R ear; R hip dysplasia as infant?   PAIN:  Are you having pain? Yes: NPRS scale: 0/10 this morning (walking 4/10; standing > 2 hours 8/10)  Pain location: lateral hip down thigh to knee  Pain description: burning pain  Aggravating factors: walking; standing  Relieving factors: stretching it out; sitting   PRECAUTIONS: None   WEIGHT BEARING RESTRICTIONS: No  FALLS:  Has patient fallen in last 6 months? 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; household chores; walks dog ~ 30 min on unlevel surfaces in the neighborhood  PLOF: Independent  PATIENT GOALS: get rid of the pain   NEXT MD VISIT: 04/30/23  OBJECTIVE:   DIAGNOSTIC FINDINGS:  None for hip or LB  PATIENT SURVEYS:  FOTO   SENSATION: Intermittent numbness in the R lateral hip and thigh to knee    MUSCLE LENGTH: Hamstrings: Right 65 deg; Left 60 deg Thomas test: Right -10 deg; Left -5 deg  POSTURE: rounded shoulders, forward head, flexed trunk , and R LE in external rotation  PALPATION: Muscular tightness R >L hip flexors, quads; R posterior hip in gluts, piriformis  LUMBAR ROM:   AROM eval  Flexion 80%  Extension 30%  Right lateral flexion 70% discomfort  R hip   Left lateral flexion 75%  Right rotation 30%  Left rotation 30%   (Blank rows = not tested)  LOWER EXTREMITY ROM:     Active  Right eval Left eval  Hip flexion    Hip extension limited limited  Hip abduction    Hip adduction    Hip internal rotation    Hip external rotation    Knee flexion    Knee extension    Ankle dorsiflexion    Ankle plantarflexion    Ankle inversion    Ankle eversion     (Blank rows = not tested)  LOWER EXTREMITY MMT:    MMT Right eval Left eval  Hip flexion 4 4+  Hip extension 4 4  Hip abduction 4- 4  Hip adduction    Hip internal rotation    Hip external rotation    Knee flexion    Knee extension    Ankle dorsiflexion    Ankle plantarflexion    Ankle inversion    Ankle eversion     (Blank rows = not tested)  LUMBAR SPECIAL TESTS:  Straight leg raise test: Negative  FUNCTIONAL TESTS:  5 times sit to stand:  SLS R 2-3 sec; L 3-4 sec   GAIT: Distance walked: 40 Assistive device utilized: None Level of assistance: Complete Independence Comments: antalgic gait with R  LE in some ER - decreased stance phase on R  [facility] Adult PT Treatment:  DATE: 02/21/2023 Therapeutic Exercise: Nustep L 6 x 5 min  Prone: (PT assist with prone exercises today only) Femoral nerve glide Quad stretch w/strap 3x30" R hip IR/ER AROM Supine: Travell piriformis stretch 3x30" ITB stretch w/strap 2x30" Hip flexor stretch 3x30" Seated: Hip add iso ball squeeze 3 sec x 10 x 2  Hip flexor stretch 30 sec x 3 L/R Hip flexor stretch with reaching overhead 30 sec x 2 Standing: Row blue TB 3 sec x 10 x 2 Antirotation green TB 3 sec x 10  Sit to stand with seat tap from elevated mat table blue TB above knees 10 x 2 Squat side step with blue TB above knees 8 ft x 5 stepping down/back along table edge Myofacial ball release standing working through the anterior and posterior hip  Manual:  STM R posterior hip  PROM R hip IR/ER patient prone  Skilled palpation to assess response to manual work and DN  Trigger Point Dry-Needling  Treatment instructions: Expect mild to moderate muscle soreness. Patient verbalized understanding of these instructions and education.  Patient Consent Given: Yes Education handout provided: Previously provided Muscles treated: R piriformis and gluts; posterior TFL Electrical stimulation performed: Yes Parameters: mAmp current intensity to pt tolerance Treatment response/outcome: twitch; decreased palpable tightness  [facility] Adult PT Treatment:                                                DATE: 02/19/2023 Therapeutic Exercise: Nustep L 6 x 5 min  Prone:  Femoral nerve glide Quad stretch w/strap 3x30" R hip IR/ER AROM Hip extension leg lifts 2x10 (B) Supine: Travell piriformis stretch 3x30" ITB stretch w/strap 2x30" Hip flexor stretch 3x30" Seated: Hip add iso ball squeeze 3 sec x 10 x 2  Hip flexor stretch 30 sec x 3 L/R Hip flexor stretch with reaching overhead 30 sec x 2 Standing: Row blue TB 3 sec  x 10 x 2 Antirotation green TB 3 sec x 10  Sit to stand  with seat tap from elevated mat table blue TB above knees 10 x 2 Squat side step with blue TB above knees 8 ft x 5 stepping down/back along table edge Myofacial ball release standing working through the anterior and posterior hip     DATE: 02/14/2023 Therapeutic Exercise: Prone:  Femoral nerve glide Quad stretch w/strap 3x30" R hip IR/ER AROM Hip extension leg lifts 2x10 (B) Supine: Travell piriformis stretch 2x30" ITB stretch w/strap 2x30" Hip flexor stretch 2x30" Seated: Hip add iso ball squeeze + postural cues to improve core stabilization Unilateral hip abd press with foot stabilization + GTB above knees x10 (B) Hip flexor stretch on corner  --> added overhead arm stretch Standing: Squats with seat tap on 2 pillows + GTB above knees Squat side step + GTB above knees --> stepping down/back along table edge Side Lying propped on elbow with myofascial ball at R glute for self-massage --> Standing focused on anterior/lateral hip    PATIENT EDUCATION:  Education details: DN handout Person educated: Patient Education method: Explanation, Demonstration, Tactile cues, Verbal cues, and Handouts Education comprehension: verbalized understanding, returned demonstration, verbal cues required, tactile cues required, and needs further education  HOME EXERCISE PROGRAM: Access Code: [email or web address] URL: [email or web address] Date: 02/21/2023 Prepared by: Heather Villegas  Exercises - Hip Flexor Stretch at [facility] of Bed  - 2 x daily -  7 x weekly - 1 sets - 3 reps - 30 sec  hold - Seated Hip Flexor Stretch  - 2 x daily - 7 x weekly - 1 sets - 3 reps - 30 sec  hold - Supine Piriformis Stretch with Leg Straight  - 2 x daily - 7 x weekly - 1 sets - 3 reps - 30 sec  hold - Prone Quadriceps Stretch with Strap  - 2 x daily - 7 x weekly - 1 sets - 3 reps - 30 sec  hold - Prone Press Up  - 2 x daily - 7 x weekly - 1 sets - 10 reps - 2-3  sec  hold - Standing Hip Extension with Counter Support  - 2 x daily - 7 x weekly - 1-2 sets - 10 reps - 3-5 sec  hold - Standing Piriformis Release with Ball at [employer]  - 2 x daily - 7 x weekly - 30-60 sec  hold - Standing Bilateral Low Shoulder Row with Anchored Resistance  - 2 x daily - 7 x weekly - 1-3 sets - 10 reps - 2-3 sec  hold - Anti-Rotation Lateral Stepping with Press  - 2 x daily - 7 x weekly - 1-2 sets - 10 reps - 2-3 sec  hold - Side Stepping with Resistance at Thighs  - 1 x daily - 7 x weekly  Patient Education - Kinesiology tape  ASSESSMENT:  CLINICAL IMPRESSION: Patient reports falling 02/19/23 striking her L arm and coccyx. She does not feel that the fall irritated the hip. Trial of DN for posterior hip in piriformis and gluts tolerated well. Note decreased palpable tightness in the posterior R hip with treatment. Continued current exercises focus on core and hip strengthening. Assess response to DN for piriformis and gluts at next visit.    EVAL: Patient is a 67 y.o. female who was seen today for physical therapy evaluation and treatment for R hip pain with numbness in the R hip area. She presents with poor posture and alignment; limited standing and walking tolerance; decreased lumbar and hip mobility/ROM; LE weakness; muscular tightness to palpation; pain on a daily basis. Patient will benefit from PT to address problems identified.   GOALS: Goals reviewed with patient? Yes  SHORT TERM GOALS: Target date: 03/25/2023  Independent in initial HEP  Baseline: Goal status: INITIAL  2.  Increased standing and walking tolerance to 2-3 hours  Baseline:  Goal status: INITIAL   LONG TERM GOALS: Target date: 05/06/2023  Increase hip extension to 0 to (+)5-10 degrees  Baseline:  Goal status: INITIAL  2.  Increase LE strength to 4+/5 to 5/5  Baseline:  Goal status: INITIAL  3.  Improve functional standing time to 3 + hours for work  Baseline:  Goal status:  INITIAL  4.  Decrease R hip pain by 75-100% allowing patient to return to all normal functional activities  Baseline:  Goal status: INITIAL  5.  Independent in HEP, including aquatic program as indicated  Baseline:  Goal status: INITIAL   PLAN:  PT FREQUENCY: 2x/week  PT DURATION: 12 weeks  PLANNED INTERVENTIONS: Therapeutic exercises, Therapeutic activity, Neuromuscular re-education, Balance training, Gait training, Patient/Family education, Self Care, Joint mobilization, Aquatic Therapy, Dry Needling, Electrical stimulation, Spinal mobilization, Cryotherapy, Moist heat, Taping, Traction, Ultrasound, Ionotophoresis 4mg /ml Dexamethasone, Manual therapy, and Re-evaluation.  PLAN FOR NEXT SESSION: Progress exercises; continue with back care education and instruction; manualwork, DN, modalities as indicated    Heather Villegas, PT 02/21/2023,  8:04 AM

## 2023-02-26 ENCOUNTER — Ambulatory Visit: Payer: Federal, State, Local not specified - PPO

## 2023-02-26 DIAGNOSIS — M25672 Stiffness of left ankle, not elsewhere classified: Secondary | ICD-10-CM | POA: Diagnosis not present

## 2023-02-26 DIAGNOSIS — R293 Abnormal posture: Secondary | ICD-10-CM

## 2023-02-26 DIAGNOSIS — M25551 Pain in right hip: Secondary | ICD-10-CM

## 2023-02-26 DIAGNOSIS — R29898 Other symptoms and signs involving the musculoskeletal system: Secondary | ICD-10-CM

## 2023-02-26 DIAGNOSIS — R2689 Other abnormalities of gait and mobility: Secondary | ICD-10-CM | POA: Diagnosis not present

## 2023-02-26 DIAGNOSIS — M25572 Pain in left ankle and joints of left foot: Secondary | ICD-10-CM | POA: Diagnosis not present

## 2023-02-26 DIAGNOSIS — M6281 Muscle weakness (generalized): Secondary | ICD-10-CM | POA: Diagnosis not present

## 2023-02-26 DIAGNOSIS — R600 Localized edema: Secondary | ICD-10-CM | POA: Diagnosis not present

## 2023-02-26 NOTE — Therapy (Signed)
OUTPATIENT PHYSICAL THERAPY HIP/LUMBAR TREATMENT   Patient Name: Heather Villegas MRN: [id number] DOB:22-Sep-1955, 67 y.o., female Today's Date: 02/26/2023  END OF SESSION:  PT End of Session - 02/26/23 0801     Visit Number 5    Number of Visits 24    Date for PT Re-Evaluation 05/07/23    Authorization Type [employer] $35 copay/50 visits per yr w/ OT/ST    PT Start Time 0802    PT Stop Time 0842    PT Time Calculation (min) 40 min    Activity Tolerance Patient tolerated treatment well    Behavior During Therapy [facility] for tasks assessed/performed             Past Medical History:  Diagnosis Date   Deafness in right ear    Heart murmur    Hyperlipidemia    diet  controlled   Meniere disease    Post-operative nausea and vomiting    Vertigo    To ED Friday night 9-10- was given Meclazine - improved    Past Surgical History:  Procedure Laterality Date   ABDOMINAL HYSTERECTOMY     2 attempts/ unable to remove uterus/ had a lot bleeding until ablation was done   ABLATION     bowel reconstruction  2003   had adhesions/ removed part of small intestine   BREAST BIOPSY Left 2013   left/ precancerous lesion   CATARACT EXTRACTION     left eye   KIDNEY STONE SURGERY  2018   lithotripsy   TONSILLECTOMY     TUBAL LIGATION     ureter reconnection     URETERAL REIMPLANTION     Patient Active Problem List   Diagnosis Date Noted   Meralgia paresthetica of right side 01/31/2023   Elevated BP without diagnosis of hypertension 01/31/2023   Left Achilles tendinitis 01/19/2022   Left lower quadrant abdominal pain 08/29/2021   Intermittent small bowel obstruction due to adhesions (HCC) 08/29/2021   Constipation 08/29/2021   Elevated glucose 07/24/2021   Hospital discharge follow-up 07/24/2021   SBO (small bowel obstruction) (HCC) 07/11/2021   Elevated LFTs 07/11/2021   Hydronephrosis 07/11/2021   Vertigo 12/19/2020   Post-operative nausea and vomiting 12/19/2020   Deafness in right ear  12/19/2020   Vestibular hypofunction of both ears 06/03/2020   Mitral regurgitation mild based on the echo done in 2020 07/15/2019   Asymmetric SNHL (sensorineural hearing loss) 05/07/2019   Tinnitus of both ears 05/07/2019   Meniere's disease of left ear 02/10/2019   Elevated LDL cholesterol level 02/10/2019   Dyslipidemia 01/05/2019   Vitamin D deficiency 12/05/2018   DOE (dyspnea on exertion) 12/04/2018   Heart murmur 12/04/2018   Healthcare maintenance 12/04/2018    PCP: Dr Nadene Rubins  REFERRING PROVIDER: Dr Vernell Leep   REFERRING DIAG: pain R hip;anesthesia of skin  Rationale for Evaluation and Treatment: Rehabilitation  THERAPY DIAG:  Pain in right hip  Other symptoms and signs involving the musculoskeletal system  Muscle weakness (generalized)  Abnormal posture  ONSET DATE: 01/08/23  SUBJECTIVE:  SUBJECTIVE STATEMENT: Patient reports she is able tolerate approx one hour of standing before N/T kicks in, states she is having numbness along lateral side of thigh. Patient reports 6-7/10 pain along lateral thigh that radiates up, states the only way to alleviate it is to bend knee.  PERTINENT HISTORY:  Achilles tendonitis; several abdominal surgeries last ~ 20 years; R kidney functions at 20%; bowel obstruction; Mnire's disease; heart murmer; deaf R ear; R hip dysplasia as infant?   PAIN:  Are you having pain? Yes: NPRS scale: 0/10 this morning (walking 4/10; standing > 2 hours 8/10)  Pain location: lateral hip down thigh to knee  Pain description: burning pain  Aggravating factors: walking; standing  Relieving factors: stretching it out; sitting   PRECAUTIONS: None   WEIGHT BEARING RESTRICTIONS: No  FALLS:  Has patient fallen in last 6 months? 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; household chores; walks dog ~ 30 min on unlevel surfaces in the neighborhood  PLOF: Independent  PATIENT GOALS: get rid of the pain   NEXT MD VISIT: 04/30/23  OBJECTIVE:   DIAGNOSTIC FINDINGS:  None for hip or LB  PATIENT SURVEYS:  FOTO   SENSATION: Intermittent numbness in the R lateral hip and thigh to knee    MUSCLE LENGTH: Hamstrings: Right 65 deg; Left 60 deg Thomas test: Right -10 deg; Left -5 deg  POSTURE: rounded shoulders, forward head, flexed trunk , and R LE in external rotation  PALPATION: Muscular tightness R >L hip flexors, quads; R posterior hip in gluts, piriformis  LUMBAR ROM:   AROM eval  Flexion 80%  Extension 30%  Right lateral flexion 70% discomfort  R hip   Left lateral flexion 75%  Right rotation 30%  Left rotation 30%   (Blank rows = not tested)  LOWER EXTREMITY ROM:     Active  Right eval Left eval  Hip flexion    Hip extension limited limited  Hip abduction    Hip adduction    Hip internal rotation    Hip external rotation    Knee flexion    Knee extension    Ankle dorsiflexion    Ankle plantarflexion    Ankle inversion    Ankle eversion     (Blank rows = not tested)  LOWER EXTREMITY MMT:    MMT Right eval Left eval  Hip flexion 4 4+  Hip extension 4 4  Hip abduction 4- 4  Hip adduction    Hip internal rotation    Hip external rotation    Knee flexion    Knee extension    Ankle dorsiflexion    Ankle plantarflexion    Ankle inversion    Ankle eversion     (Blank rows = not tested)  LUMBAR SPECIAL TESTS:  Straight leg raise test: Negative  FUNCTIONAL TESTS:  5 times sit to stand:  SLS R 2-3 sec; L 3-4 sec   GAIT: Distance walked: 40 Assistive device utilized: None Level of  assistance: Complete Independence Comments: antalgic gait with R LE in some ER - decreased stance phase on R   [facility] Adult PT Treatment:  DATE: 02/26/2023 Therapeutic Exercise: NuStep L6 x 5 min Prone: Femoral nerve glides x10 Quad stretch w/strap (R) 3x30" (R) hip IR/ER Figure 4 LTR x10 S/L ITB stretch (back leg hanging off EOT) 2x30" Seated: HS stretch   Hip flexor stretch + overhead reaching with trunk rotation 2x30" (R) LAQ + ball squeeze hip add iso x10 Quarter wall squat + hip add ball squeeze 2x10 Bent over ITB standing stretch Manual Therapy: Roller stick lateral quad, ITB, distal lateral HS    [facility] Adult PT Treatment:                                                DATE: 02/21/2023 Therapeutic Exercise: Nustep L 6 x 5 min  Prone: (PT assist with prone exercises today only) Femoral nerve glide Quad stretch w/strap 3x30" R hip IR/ER AROM Supine: Travell piriformis stretch 3x30" ITB stretch w/strap 2x30" Hip flexor stretch 3x30" Seated: Hip add iso ball squeeze 3 sec x 10 x 2  Hip flexor stretch 30 sec x 3 L/R Hip flexor stretch with reaching overhead 30 sec x 2 Standing: Row blue TB 3 sec x 10 x 2 Antirotation green TB 3 sec x 10  Sit to stand with seat tap from elevated mat table blue TB above knees 10 x 2 Squat side step with blue TB above knees 8 ft x 5 stepping down/back along table edge Myofacial ball release standing working through the anterior and posterior hip  Manual:  STM R posterior hip  PROM R hip IR/ER patient prone  Skilled palpation to assess response to manual work and DN  Trigger Point Dry-Needling  Treatment instructions: Expect mild to moderate muscle soreness. Patient verbalized understanding of these instructions and education.  Patient Consent Given: Yes Education handout provided: Previously provided Muscles treated: R piriformis and gluts; posterior TFL Electrical stimulation performed:  Yes Parameters: mAmp current intensity to pt tolerance Treatment response/outcome: twitch; decreased palpable tightness   [facility] Adult PT Treatment:                                                DATE: 02/19/2023 Therapeutic Exercise: Nustep L 6 x 5 min  Prone:  Femoral nerve glide Quad stretch w/strap 3x30" R hip IR/ER AROM Hip extension leg lifts 2x10 (B) Supine: Travell piriformis stretch 3x30" ITB stretch w/strap 2x30" Hip flexor stretch 3x30" Seated: Hip add iso ball squeeze 3 sec x 10 x 2  Hip flexor stretch 30 sec x 3 L/R Hip flexor stretch with reaching overhead 30 sec x 2 Standing: Row blue TB 3 sec x 10 x 2 Antirotation green TB 3 sec x 10  Sit to stand  with seat tap from elevated mat table blue TB above knees 10 x 2 Squat side step with blue TB above knees 8 ft x 5 stepping down/back along table edge Myofacial ball release standing working through the anterior and posterior hip     PATIENT EDUCATION:  Education details: DN handout Person educated: Patient Education method: Programmer, multimedia, Facilities manager, Actor cues, Verbal cues, and Handouts Education comprehension: verbalized understanding, returned demonstration, verbal cues required, tactile cues required, and needs further education  HOME EXERCISE PROGRAM: Access Code: [email or web address] URL: [email or web address] Date: 02/21/2023 Prepared by:  Celyn Holt  Exercises - Hip Recruitment consultant at [employer] of Bed  - 2 x daily - 7 x weekly - 1 sets - 3 reps - 30 sec  hold - Seated Hip Flexor Stretch  - 2 x daily - 7 x weekly - 1 sets - 3 reps - 30 sec  hold - Supine Piriformis Stretch with Leg Straight  - 2 x daily - 7 x weekly - 1 sets - 3 reps - 30 sec  hold - Prone Quadriceps Stretch with Strap  - 2 x daily - 7 x weekly - 1 sets - 3 reps - 30 sec  hold - Prone Press Up  - 2 x daily - 7 x weekly - 1 sets - 10 reps - 2-3 sec  hold - Standing Hip Extension with Counter Support  - 2 x daily - 7 x weekly - 1-2 sets - 10  reps - 3-5 sec  hold - Standing Piriformis Release with Ball at [employer]  - 2 x daily - 7 x weekly - 30-60 sec  hold - Standing Bilateral Low Shoulder Row with Anchored Resistance  - 2 x daily - 7 x weekly - 1-3 sets - 10 reps - 2-3 sec  hold - Anti-Rotation Lateral Stepping with Press  - 2 x daily - 7 x weekly - 1-2 sets - 10 reps - 2-3 sec  hold - Side Stepping with Resistance at Thighs  - 1 x daily - 7 x weekly  Patient Education - Kinesiology tape  ASSESSMENT:  CLINICAL IMPRESSION: Quad and hip flexor stretches continued to address continual tightness along lateral thigh musculature. Isometric hip adduction incorporated to progress inner thigh strength and stability. Occasional cueing provided to improve postural alignment and stability.   EVAL: Patient is a 67 y.o. female who was seen today for physical therapy evaluation and treatment for R hip pain with numbness in the R hip area. She presents with poor posture and alignment; limited standing and walking tolerance; decreased lumbar and hip mobility/ROM; LE weakness; muscular tightness to palpation; pain on a daily basis. Patient will benefit from PT to address problems identified.   GOALS: Goals reviewed with patient? Yes  SHORT TERM GOALS: Target date: 03/25/2023  Independent in initial HEP  Baseline: Goal status: INITIAL  2.  Increased standing and walking tolerance to 2-3 hours  Baseline:  Goal status: INITIAL   LONG TERM GOALS: Target date: 05/06/2023  Increase hip extension to 0 to (+)5-10 degrees  Baseline:  Goal status: INITIAL  2.  Increase LE strength to 4+/5 to 5/5  Baseline:  Goal status: INITIAL  3.  Improve functional standing time to 3 + hours for work  Baseline:  Goal status: INITIAL  4.  Decrease R hip pain by 75-100% allowing patient to return to all normal functional activities  Baseline:  Goal status: INITIAL  5.  Independent in HEP, including aquatic program as indicated  Baseline:  Goal  status: INITIAL   PLAN:  PT FREQUENCY: 2x/week  PT DURATION: 12 weeks  PLANNED INTERVENTIONS: Therapeutic exercises, Therapeutic activity, Neuromuscular re-education, Balance training, Gait training, Patient/Family education, Self Care, Joint mobilization, Aquatic Therapy, Dry Needling, Electrical stimulation, Spinal mobilization, Cryotherapy, Moist heat, Taping, Traction, Ultrasound, Ionotophoresis 4mg /ml Dexamethasone, Manual therapy, and Re-evaluation.  PLAN FOR NEXT SESSION: Progress exercises; continue with back care education and instruction; manualwork, DN, modalities as indicated    Sanjuana Mae, PTA 02/26/2023, 8:43 AM

## 2023-02-28 ENCOUNTER — Ambulatory Visit: Payer: Federal, State, Local not specified - PPO

## 2023-02-28 DIAGNOSIS — R29898 Other symptoms and signs involving the musculoskeletal system: Secondary | ICD-10-CM | POA: Diagnosis not present

## 2023-02-28 DIAGNOSIS — R600 Localized edema: Secondary | ICD-10-CM | POA: Diagnosis not present

## 2023-02-28 DIAGNOSIS — M6281 Muscle weakness (generalized): Secondary | ICD-10-CM | POA: Diagnosis not present

## 2023-02-28 DIAGNOSIS — M25572 Pain in left ankle and joints of left foot: Secondary | ICD-10-CM | POA: Diagnosis not present

## 2023-02-28 DIAGNOSIS — M25672 Stiffness of left ankle, not elsewhere classified: Secondary | ICD-10-CM | POA: Diagnosis not present

## 2023-02-28 DIAGNOSIS — M25551 Pain in right hip: Secondary | ICD-10-CM | POA: Diagnosis not present

## 2023-02-28 DIAGNOSIS — R2689 Other abnormalities of gait and mobility: Secondary | ICD-10-CM | POA: Diagnosis not present

## 2023-02-28 DIAGNOSIS — R293 Abnormal posture: Secondary | ICD-10-CM | POA: Diagnosis not present

## 2023-02-28 NOTE — Therapy (Signed)
OUTPATIENT PHYSICAL THERAPY HIP/LUMBAR TREATMENT   Patient Name: Heather Villegas MRN: [id number] DOB:September 13, 1955, 67 y.o., female Today's Date: 02/28/2023  END OF SESSION:  PT End of Session - 02/28/23 0806     Visit Number 6    Number of Visits 24    Date for PT Re-Evaluation 05/07/23    Authorization Type [employer] $35 copay/50 visits per yr w/ OT/ST    PT Start Time 0806    PT Stop Time 0845    PT Time Calculation (min) 39 min    Activity Tolerance Patient tolerated treatment well    Behavior During Therapy [facility] for tasks assessed/performed             Past Medical History:  Diagnosis Date   Deafness in right ear    Heart murmur    Hyperlipidemia    diet  controlled   Meniere disease    Post-operative nausea and vomiting    Vertigo    To ED Friday night 9-10- was given Meclazine - improved    Past Surgical History:  Procedure Laterality Date   ABDOMINAL HYSTERECTOMY     2 attempts/ unable to remove uterus/ had a lot bleeding until ablation was done   ABLATION     bowel reconstruction  2003   had adhesions/ removed part of small intestine   BREAST BIOPSY Left 2013   left/ precancerous lesion   CATARACT EXTRACTION     left eye   KIDNEY STONE SURGERY  2018   lithotripsy   TONSILLECTOMY     TUBAL LIGATION     ureter reconnection     URETERAL REIMPLANTION     Patient Active Problem List   Diagnosis Date Noted   Meralgia paresthetica of right side 01/31/2023   Elevated BP without diagnosis of hypertension 01/31/2023   Left Achilles tendinitis 01/19/2022   Left lower quadrant abdominal pain 08/29/2021   Intermittent small bowel obstruction due to adhesions (HCC) 08/29/2021   Constipation 08/29/2021   Elevated glucose 07/24/2021   Hospital discharge follow-up 07/24/2021   SBO (small bowel obstruction) (HCC) 07/11/2021   Elevated LFTs 07/11/2021   Hydronephrosis 07/11/2021   Vertigo 12/19/2020   Post-operative nausea and vomiting 12/19/2020   Deafness in right  ear 12/19/2020   Vestibular hypofunction of both ears 06/03/2020   Mitral regurgitation mild based on the echo done in 2020 07/15/2019   Asymmetric SNHL (sensorineural hearing loss) 05/07/2019   Tinnitus of both ears 05/07/2019   Meniere's disease of left ear 02/10/2019   Elevated LDL cholesterol level 02/10/2019   Dyslipidemia 01/05/2019   Vitamin D deficiency 12/05/2018   DOE (dyspnea on exertion) 12/04/2018   Heart murmur 12/04/2018   Healthcare maintenance 12/04/2018    PCP: Dr Nadene Rubins  REFERRING PROVIDER: Dr Vernell Leep   REFERRING DIAG: pain R hip;anesthesia of skin  Rationale for Evaluation and Treatment: Rehabilitation  THERAPY DIAG:  Pain in right hip  Other symptoms and signs involving the musculoskeletal system  Muscle weakness (generalized)  ONSET DATE: 01/08/23  SUBJECTIVE:  SUBJECTIVE STATEMENT: Patient reports she felt better after last visit with less pain (mild bruising from roller stick) but is back to 6/10 pain along lateral thigh.  PERTINENT HISTORY:  Achilles tendonitis; several abdominal surgeries last ~ 20 years; R kidney functions at 20%; bowel obstruction; Mnire's disease; heart murmer; deaf R ear; R hip dysplasia as infant?   PAIN:  Are you having pain? Yes: NPRS scale: 0/10 this morning (walking 4/10; standing > 2 hours 8/10)  Pain location: lateral hip down thigh to knee  Pain description: burning pain  Aggravating factors: walking; standing  Relieving factors: stretching it out; sitting   PRECAUTIONS: None   WEIGHT BEARING RESTRICTIONS: No  FALLS:  Has patient fallen in last 6 months? 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; household chores; walks dog ~ 30 min on unlevel surfaces in the neighborhood  PLOF: Independent  PATIENT GOALS: get rid of the pain   NEXT MD VISIT: 04/30/23  OBJECTIVE:   DIAGNOSTIC FINDINGS:  None for hip or LB  PATIENT SURVEYS:  FOTO   SENSATION: Intermittent numbness in the R lateral hip and thigh to knee    MUSCLE LENGTH: Hamstrings: Right 65 deg; Left 60 deg Thomas test: Right -10 deg; Left -5 deg  POSTURE: rounded shoulders, forward head, flexed trunk , and R LE in external rotation  PALPATION: Muscular tightness R >L hip flexors, quads; R posterior hip in gluts, piriformis  LUMBAR ROM:   AROM eval  Flexion 80%  Extension 30%  Right lateral flexion 70% discomfort  R hip   Left lateral flexion 75%  Right rotation 30%  Left rotation 30%   (Blank rows = not tested)  LOWER EXTREMITY ROM:     Active  Right eval Left eval  Hip flexion    Hip extension limited limited  Hip abduction    Hip adduction    Hip internal rotation    Hip external rotation    Knee flexion    Knee extension    Ankle dorsiflexion    Ankle plantarflexion    Ankle inversion    Ankle eversion     (Blank rows = not tested)  LOWER EXTREMITY MMT:    MMT Right eval Left eval  Hip flexion 4 4+  Hip extension 4 4  Hip abduction 4- 4  Hip adduction    Hip internal rotation    Hip external rotation    Knee flexion    Knee extension    Ankle dorsiflexion    Ankle plantarflexion    Ankle inversion    Ankle eversion     (Blank rows = not tested)  LUMBAR SPECIAL TESTS:  Straight leg raise test: Negative  FUNCTIONAL TESTS:  5 times sit to stand:  SLS R 2-3 sec; L 3-4 sec   GAIT: Distance walked: 40 Assistive device utilized: None Level of assistance: Complete Independence Comments: antalgic gait with R LE in some ER - decreased stance phase on R   [facility] Adult PT Treatment:                                                 DATE: 02/28/2023 Therapeutic Exercise: NuStep L6  x Standing self-massage with small yellow soft ball --> cross-friction along ITB/lateral quad Supine HS & ITB stretches with strap 3x30" each Runner's luge gastroc stretch (R)  Eccentric heels raises (R) 2x10 Seated hip flexor stretch + overhead reaching with trunk rotation 2x30" (R) Manual Therapy: IASTM (R) distal hamstrings & lateral gastroc    [facility] Adult PT Treatment:                                                DATE: 02/26/2023 Therapeutic Exercise: NuStep L6 x 5 min Prone: Femoral nerve glides x10 Quad stretch w/strap (R) 3x30" (R) hip IR/ER Figure 4 LTR x10 S/L ITB stretch (back leg hanging off EOT) 2x30" Seated: HS stretch   Hip flexor stretch + overhead reaching with trunk rotation 2x30" (R) LAQ + ball squeeze hip add iso x10 Quarter wall squat + hip add ball squeeze 2x10 Bent over ITB standing stretch Manual Therapy: Roller stick lateral quad, ITB, distal lateral HS    [facility] Adult PT Treatment:                                                DATE: 02/21/2023 Therapeutic Exercise: Nustep L 6 x 5 min  Prone: (PT assist with prone exercises today only) Femoral nerve glide Quad stretch w/strap 3x30" R hip IR/ER AROM Supine: Travell piriformis stretch 3x30" ITB stretch w/strap 2x30" Hip flexor stretch 3x30" Seated: Hip add iso ball squeeze 3 sec x 10 x 2  Hip flexor stretch 30 sec x 3 L/R Hip flexor stretch with reaching overhead 30 sec x 2 Standing: Row blue TB 3 sec x 10 x 2 Antirotation green TB 3 sec x 10  Sit to stand with seat tap from elevated mat table blue TB above knees 10 x 2 Squat side step with blue TB above knees 8 ft x 5 stepping down/back along table edge Myofacial ball release standing working through the anterior and posterior hip  Manual:  STM R posterior hip  PROM R hip IR/ER patient prone  Skilled palpation to assess response to manual work and DN   Trigger Point Dry-Needling  Treatment instructions: Expect mild to moderate muscle soreness. Patient verbalized understanding of these instructions and education.  Patient Consent Given: Yes Education handout provided: Previously provided Muscles treated: R piriformis and gluts; posterior TFL Electrical stimulation performed: Yes Parameters: mAmp current intensity to pt tolerance Treatment response/outcome: twitch; decreased palpable tightness   PATIENT EDUCATION:  Education details: DN handout Person educated: Patient Education method: Explanation, Demonstration, Tactile cues, Verbal cues, and Handouts Education comprehension: verbalized understanding, returned demonstration, verbal cues required, tactile cues required, and needs further education  HOME EXERCISE PROGRAM: Access Code: [email or web address] URL: [email or web address] Date: 02/21/2023 Prepared by: Corlis Leak  Exercises - Hip Flexor Stretch at [facility] of Bed  - 2 x daily - 7 x weekly - 1 sets - 3 reps - 30 sec  hold - Seated Hip Flexor Stretch  - 2 x daily - 7 x weekly - 1 sets - 3 reps - 30 sec  hold - Supine Piriformis Stretch with Leg Straight  - 2 x daily - 7 x weekly - 1 sets - 3 reps - 30 sec  hold -  Prone Quadriceps Stretch with Strap  - 2 x daily - 7 x weekly - 1 sets - 3 reps - 30 sec  hold - Prone Press Up  - 2 x daily - 7 x weekly - 1 sets - 10 reps - 2-3 sec  hold - Standing Hip Extension with Counter Support  - 2 x daily - 7 x weekly - 1-2 sets - 10 reps - 3-5 sec  hold - Standing Piriformis Release with Ball at [employer]  - 2 x daily - 7 x weekly - 30-60 sec  hold - Standing Bilateral Low Shoulder Row with Anchored Resistance  - 2 x daily - 7 x weekly - 1-3 sets - 10 reps - 2-3 sec  hold - Anti-Rotation Lateral Stepping with Press  - 2 x daily - 7 x weekly - 1-2 sets - 10 reps - 2-3 sec  hold - Side Stepping with Resistance at Thighs  - 1 x daily - 7 x weekly  Patient Education - Kinesiology  tape  ASSESSMENT:  CLINICAL IMPRESSION: Noted myofascial tightness and tenderness in lateral gastroc on R side. Improved mobility and decreased pain after IASTM treatment; eccentric heel raises incorporated with exercises. Recommended patient try performing eccentric heel raises and calf stretches throughout work shift to address myofascial tightness and tension.  EVAL: Patient is a 67 y.o. female who was seen today for physical therapy evaluation and treatment for R hip pain with numbness in the R hip area. She presents with poor posture and alignment; limited standing and walking tolerance; decreased lumbar and hip mobility/ROM; LE weakness; muscular tightness to palpation; pain on a daily basis. Patient will benefit from PT to address problems identified.   GOALS: Goals reviewed with patient? Yes  SHORT TERM GOALS: Target date: 03/25/2023  Independent in initial HEP  Baseline: Goal status: INITIAL  2.  Increased standing and walking tolerance to 2-3 hours  Baseline:  Goal status: INITIAL   LONG TERM GOALS: Target date: 05/06/2023  Increase hip extension to 0 to (+)5-10 degrees  Baseline:  Goal status: INITIAL  2.  Increase LE strength to 4+/5 to 5/5  Baseline:  Goal status: INITIAL  3.  Improve functional standing time to 3 + hours for work  Baseline:  Goal status: INITIAL  4.  Decrease R hip pain by 75-100% allowing patient to return to all normal functional activities  Baseline:  Goal status: INITIAL  5.  Independent in HEP, including aquatic program as indicated  Baseline:  Goal status: INITIAL   PLAN:  PT FREQUENCY: 2x/week  PT DURATION: 12 weeks  PLANNED INTERVENTIONS: Therapeutic exercises, Therapeutic activity, Neuromuscular re-education, Balance training, Gait training, Patient/Family education, Self Care, Joint mobilization, Aquatic Therapy, Dry Needling, Electrical stimulation, Spinal mobilization, Cryotherapy, Moist heat, Taping, Traction, Ultrasound,  Ionotophoresis 4mg /ml Dexamethasone, Manual therapy, and Re-evaluation.  PLAN FOR NEXT SESSION: Progress exercises; continue with back care education and instruction; manualwork, DN, modalities as indicated    Sanjuana Mae, PTA 02/28/2023, 8:46 AM

## 2023-03-04 NOTE — Therapy (Signed)
OUTPATIENT PHYSICAL THERAPY HIP/LUMBAR TREATMENT   Patient Name: Heather Villegas MRN: [id number] DOB:1955-05-25, 67 y.o., female Today's Date: 03/05/2023  END OF SESSION:  PT End of Session - 03/05/23 0844     Visit Number 7    Number of Visits 24    Date for PT Re-Evaluation 05/07/23    Authorization Type [employer] $35 copay/50 visits per yr w/ OT/ST    PT Start Time 0844    PT Stop Time 0925    PT Time Calculation (min) 41 min              Past Medical History:  Diagnosis Date   Deafness in right ear    Heart murmur    Hyperlipidemia    diet  controlled   Meniere disease    Post-operative nausea and vomiting    Vertigo    To ED Friday night 9-10- was given Meclazine - improved    Past Surgical History:  Procedure Laterality Date   ABDOMINAL HYSTERECTOMY     2 attempts/ unable to remove uterus/ had a lot bleeding until ablation was done   ABLATION     bowel reconstruction  2003   had adhesions/ removed part of small intestine   BREAST BIOPSY Left 2013   left/ precancerous lesion   CATARACT EXTRACTION     left eye   KIDNEY STONE SURGERY  2018   lithotripsy   TONSILLECTOMY     TUBAL LIGATION     ureter reconnection     URETERAL REIMPLANTION     Patient Active Problem List   Diagnosis Date Noted   Meralgia paresthetica of right side 01/31/2023   Elevated BP without diagnosis of hypertension 01/31/2023   Left Achilles tendinitis 01/19/2022   Left lower quadrant abdominal pain 08/29/2021   Intermittent small bowel obstruction due to adhesions (HCC) 08/29/2021   Constipation 08/29/2021   Elevated glucose 07/24/2021   Hospital discharge follow-up 07/24/2021   SBO (small bowel obstruction) (HCC) 07/11/2021   Elevated LFTs 07/11/2021   Hydronephrosis 07/11/2021   Vertigo 12/19/2020   Post-operative nausea and vomiting 12/19/2020   Deafness in right ear 12/19/2020   Vestibular hypofunction of both ears 06/03/2020   Mitral regurgitation mild based on the echo done  in 2020 07/15/2019   Asymmetric SNHL (sensorineural hearing loss) 05/07/2019   Tinnitus of both ears 05/07/2019   Meniere's disease of left ear 02/10/2019   Elevated LDL cholesterol level 02/10/2019   Dyslipidemia 01/05/2019   Vitamin D deficiency 12/05/2018   DOE (dyspnea on exertion) 12/04/2018   Heart murmur 12/04/2018   Healthcare maintenance 12/04/2018    PCP: Dr Nadene Rubins  REFERRING PROVIDER: Dr Vernell Leep   REFERRING DIAG: pain R hip;anesthesia of skin  Rationale for Evaluation and Treatment: Rehabilitation  THERAPY DIAG:  Pain in right hip  Other symptoms and signs involving the musculoskeletal system  Muscle weakness (generalized)  Abnormal posture  Pain in left ankle and joints of left foot  Stiffness of left ankle, not elsewhere classified  Other abnormalities of gait and mobility  Localized edema  ONSET DATE: 01/08/23  SUBJECTIVE:  SUBJECTIVE STATEMENT: Patient reports no pain today yet. She stood and made pancakes and got a little tingly.  PERTINENT HISTORY:  Achilles tendonitis; several abdominal surgeries last ~ 20 years; R kidney functions at 20%; bowel obstruction; Mnire's disease; heart murmer; deaf R ear; R hip dysplasia as infant?   PAIN:  Are you having pain? Yes: NPRS scale: 0/10 this morning (walking 4/10; standing > 2 hours 8/10)  Pain location: lateral hip down thigh to knee  Pain description: burning pain  Aggravating factors: walking; standing  Relieving factors: stretching it out; sitting   PRECAUTIONS: None   WEIGHT BEARING RESTRICTIONS: No  FALLS:  Has patient fallen in last 6 months? 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; household chores; walks dog ~ 30 min on unlevel surfaces in the neighborhood  PLOF: Independent  PATIENT GOALS: get rid of the pain   NEXT MD VISIT: 04/30/23  OBJECTIVE:   DIAGNOSTIC FINDINGS:  None for hip or LB  PATIENT SURVEYS:  FOTO   SENSATION: Intermittent numbness in the R lateral hip and thigh to knee    MUSCLE LENGTH: Hamstrings: Right 65 deg; Left 60 deg Thomas test: Right -10 deg; Left -5 deg  POSTURE: rounded shoulders, forward head, flexed trunk , and R LE in external rotation  PALPATION: Muscular tightness R >L hip flexors, quads; R posterior hip in gluts, piriformis  LUMBAR ROM:   AROM eval  Flexion 80%  Extension 30%  Right lateral flexion 70% discomfort  R hip   Left lateral flexion 75%  Right rotation 30%  Left rotation 30%   (Blank rows = not tested)  LOWER EXTREMITY ROM:     Active  Right eval Left eval  Hip flexion    Hip extension limited limited  Hip abduction    Hip adduction    Hip internal rotation    Hip external rotation    Knee flexion    Knee extension    Ankle dorsiflexion    Ankle plantarflexion    Ankle inversion    Ankle eversion     (Blank rows = not tested)  LOWER EXTREMITY MMT:    MMT Right eval Left eval  Hip flexion 4 4+  Hip extension 4 4  Hip abduction 4- 4  Hip adduction    Hip internal rotation    Hip external rotation    Knee flexion    Knee extension    Ankle dorsiflexion    Ankle plantarflexion    Ankle inversion    Ankle eversion     (Blank rows = not tested)  LUMBAR SPECIAL TESTS:  Straight leg raise test: Negative  FUNCTIONAL TESTS:  5 times sit to stand:  SLS R 2-3 sec; L 3-4 sec   GAIT: Distance walked: 40 Assistive device utilized: None Level of assistance: Complete Independence Comments: antalgic gait with R LE in some ER - decreased stance phase on R    [facility] Adult  PT Treatment:                                                DATE: 03/05/2023 Therapeutic Exercise: NuStep L6 x Runner's lunge gastroc stretch (R) 2  x 30 sec Eccentric heels raises (R) 2x10 cues to slow down lowering Seated hip flexor stretch + overhead reaching with trunk rotation 2x30" (R) Lumbar ext at wall 10 sec hold x 5 increased tingling thigh; also did in standing Supine DKTC x 10 no change Prone lying decreases tingling Prone on elbows decreases tingling more; prone press ups x 10 abolishes  Manual Therapy: STM (R) lateral hamstrings & lateral gastroc Skilled palpation and monitoring of soft tissues during DN  Trigger Point Dry-Needling  Treatment instructions: Expect mild to moderate muscle soreness. S/S of pneumothorax if dry needled over a lung field, and to seek immediate medical attention should they occur. Patient verbalized understanding of these instructions and education. Patient Consent Given: Yes Education handout provided: Previously provided Muscles treated: R lateral quads and gastroc Electrical stimulation performed: No Parameters: N/A Treatment response/outcome: Twitch Response Elicited and Palpable Increase in Muscle Length   [facility] Adult PT Treatment:                                                DATE: 02/28/2023 Therapeutic Exercise: NuStep L6 x Standing self-massage with small yellow soft ball --> cross-friction along ITB/lateral quad Supine HS & ITB stretches with strap 3x30" each Runner's luge gastroc stretch (R)  Eccentric heels raises (R) 2x10 Seated hip flexor stretch + overhead reaching with trunk rotation 2x30" (R) Manual Therapy: IASTM (R) distal hamstrings & lateral gastroc    [facility] Adult PT Treatment:                                                DATE: 02/26/2023 Therapeutic Exercise: NuStep L6 x 5 min Prone: Femoral nerve glides x10 Quad stretch w/strap (R) 3x30" (R) hip IR/ER Figure 4 LTR x10 S/L ITB stretch (back leg hanging  off EOT) 2x30" Seated: HS stretch   Hip flexor stretch + overhead reaching with trunk rotation 2x30" (R) LAQ + ball squeeze hip add iso x10 Quarter wall squat + hip add ball squeeze 2x10 Bent over ITB standing stretch Manual Therapy: Roller stick lateral quad, ITB, distal lateral HS    [facility] Adult PT Treatment:                                                DATE: 02/21/2023 Therapeutic Exercise: Nustep L 6 x 5 min  Prone: (PT assist with prone exercises today only) Femoral nerve glide Quad stretch w/strap 3x30" R hip IR/ER AROM Supine: Travell piriformis stretch 3x30" ITB stretch w/strap 2x30" Hip flexor stretch 3x30" Seated: Hip add iso ball squeeze 3 sec x 10 x 2  Hip flexor stretch 30 sec x 3 L/R Hip flexor stretch with reaching overhead 30 sec x 2 Standing: Row blue TB 3 sec x 10 x 2 Antirotation green TB 3 sec x 10  Sit to stand with seat tap from elevated mat table blue TB above knees 10 x 2 Squat side step with blue TB above knees 8 ft x 5 stepping down/back along table edge Myofacial ball release standing working through the anterior and posterior hip  Manual:  STM R posterior hip  PROM R hip IR/ER patient prone  Skilled palpation to assess response to manual work and DN  Trigger Point Dry-Needling  Treatment instructions: Expect mild to moderate muscle soreness. Patient verbalized understanding of these instructions and education.  Patient Consent Given: Yes Education handout provided: Previously provided Muscles treated: R piriformis and gluts; posterior TFL Electrical stimulation performed: Yes Parameters: mAmp current intensity to pt tolerance Treatment response/outcome: twitch; decreased palpable tightness   PATIENT EDUCATION:  Education details: DN handout Person educated: Patient Education method: Explanation, Demonstration, Tactile cues, Verbal cues, and Handouts Education comprehension: verbalized understanding, returned demonstration, verbal cues  required, tactile cues required, and needs further education  HOME EXERCISE PROGRAM: Access Code: [email or web address] URL: [email or web address] Date: 02/21/2023 Prepared by: Corlis Leak  Exercises - Hip Flexor Stretch at [facility] of Bed  - 2 x daily - 7 x weekly - 1 sets - 3 reps - 30 sec  hold - Seated Hip Flexor Stretch  - 2 x daily - 7 x weekly - 1 sets - 3 reps - 30 sec  hold - Supine Piriformis Stretch with Leg Straight  - 2 x daily - 7 x weekly - 1 sets - 3 reps - 30 sec  hold - Prone Quadriceps Stretch with Strap  - 2 x daily - 7 x weekly - 1 sets - 3 reps - 30 sec  hold - Prone Press Up  - 2 x daily - 7 x weekly - 1 sets - 10 reps - 2-3 sec  hold - Standing Hip Extension with Counter Support  - 2 x daily - 7 x weekly - 1-2 sets - 10 reps - 3-5 sec  hold - Standing Piriformis Release with Ball at [employer]  - 2 x daily - 7 x weekly - 30-60 sec  hold - Standing Bilateral Low Shoulder Row with Anchored Resistance  - 2 x daily - 7 x weekly - 1-3 sets - 10 reps - 2-3 sec  hold - Anti-Rotation Lateral Stepping with Press  - 2 x daily - 7 x weekly - 1-2 sets - 10 reps - 2-3 sec  hold - Side Stepping with Resistance at Thighs  - 1 x daily - 7 x weekly  Patient Education - Kinesiology tape  ASSESSMENT:  CLINICAL IMPRESSION: Aleiyah was able to abolish tingling in her R LE with prone press ups, so advised her to do these whenever she feels the symptoms. Advised lumbar extension in standing as alternative when at work. She was very tight in her lateral quads and gastroc and responded with excellent twitch responses and tissue elongation following DN/MT. She reports some of the exercises increase her symptoms but could not specify which ones.   EVAL: Patient is a 67 y.o. female who was seen today for physical therapy evaluation and treatment for R hip pain with numbness in the R hip area. She presents with poor posture and alignment; limited standing and walking tolerance; decreased lumbar and hip  mobility/ROM; LE weakness; muscular tightness to palpation; pain on a daily basis. Patient will benefit from PT to address problems identified.   GOALS: Goals reviewed with patient? Yes  SHORT TERM GOALS: Target date: 03/25/2023  Independent in initial HEP  Baseline: Goal status: INITIAL  2.  Increased standing and walking tolerance to 2-3 hours  Baseline:  Goal status: INITIAL   LONG TERM GOALS: Target date: 05/06/2023  Increase hip extension to 0 to (+)5-10 degrees  Baseline:  Goal status: INITIAL  2.  Increase LE strength to 4+/5 to 5/5  Baseline:  Goal status: INITIAL  3.  Improve functional standing time to 3 + hours for work  Baseline:  Goal status: INITIAL  4.  Decrease R hip pain by 75-100% allowing patient to return to all normal functional activities  Baseline:  Goal status: INITIAL  5.  Independent in HEP, including aquatic program as indicated  Baseline:  Goal status: INITIAL   PLAN:  PT FREQUENCY: 2x/week  PT DURATION: 12 weeks  PLANNED INTERVENTIONS: Therapeutic exercises, Therapeutic activity, Neuromuscular re-education, Balance training, Gait training, Patient/Family education, Self Care, Joint mobilization, Aquatic Therapy, Dry Needling, Electrical stimulation, Spinal mobilization, Cryotherapy, Moist heat, Taping, Traction, Ultrasound, Ionotophoresis 4mg /ml Dexamethasone, Manual therapy, and Re-evaluation.  PLAN FOR NEXT SESSION:  Assess response to DN and prone press ups. Progress exercises; continue with back care education and instruction; manualwork, DN, modalities as indicated    Solon Palm, PT  03/05/2023, 9:28 AM

## 2023-03-05 ENCOUNTER — Encounter: Payer: Self-pay | Admitting: Physical Therapy

## 2023-03-05 ENCOUNTER — Ambulatory Visit: Payer: Federal, State, Local not specified - PPO | Admitting: Physical Therapy

## 2023-03-05 DIAGNOSIS — M25551 Pain in right hip: Secondary | ICD-10-CM

## 2023-03-05 DIAGNOSIS — M25672 Stiffness of left ankle, not elsewhere classified: Secondary | ICD-10-CM

## 2023-03-05 DIAGNOSIS — R29898 Other symptoms and signs involving the musculoskeletal system: Secondary | ICD-10-CM | POA: Diagnosis not present

## 2023-03-05 DIAGNOSIS — R2689 Other abnormalities of gait and mobility: Secondary | ICD-10-CM | POA: Diagnosis not present

## 2023-03-05 DIAGNOSIS — M25572 Pain in left ankle and joints of left foot: Secondary | ICD-10-CM

## 2023-03-05 DIAGNOSIS — R293 Abnormal posture: Secondary | ICD-10-CM | POA: Diagnosis not present

## 2023-03-05 DIAGNOSIS — R600 Localized edema: Secondary | ICD-10-CM | POA: Diagnosis not present

## 2023-03-05 DIAGNOSIS — M6281 Muscle weakness (generalized): Secondary | ICD-10-CM

## 2023-03-07 ENCOUNTER — Ambulatory Visit: Payer: Federal, State, Local not specified - PPO | Admitting: Physical Therapy

## 2023-03-07 ENCOUNTER — Encounter: Payer: Self-pay | Admitting: Physical Therapy

## 2023-03-07 DIAGNOSIS — R2689 Other abnormalities of gait and mobility: Secondary | ICD-10-CM | POA: Diagnosis not present

## 2023-03-07 DIAGNOSIS — M25572 Pain in left ankle and joints of left foot: Secondary | ICD-10-CM | POA: Diagnosis not present

## 2023-03-07 DIAGNOSIS — R600 Localized edema: Secondary | ICD-10-CM | POA: Diagnosis not present

## 2023-03-07 DIAGNOSIS — M6281 Muscle weakness (generalized): Secondary | ICD-10-CM | POA: Diagnosis not present

## 2023-03-07 DIAGNOSIS — R293 Abnormal posture: Secondary | ICD-10-CM | POA: Diagnosis not present

## 2023-03-07 DIAGNOSIS — R29898 Other symptoms and signs involving the musculoskeletal system: Secondary | ICD-10-CM | POA: Diagnosis not present

## 2023-03-07 DIAGNOSIS — M25551 Pain in right hip: Secondary | ICD-10-CM | POA: Diagnosis not present

## 2023-03-07 DIAGNOSIS — M25672 Stiffness of left ankle, not elsewhere classified: Secondary | ICD-10-CM | POA: Diagnosis not present

## 2023-03-07 NOTE — Therapy (Addendum)
OUTPATIENT PHYSICAL THERAPY HIP/LUMBAR TREATMENT AND DISCHARGE   Patient Name: Heather Villegas MRN: [id number] DOB:[birth year], 67 y.o., female Today's Date: 03/07/2023  END OF SESSION:  PT End of Session - 03/07/23 1055     Visit Number 8    Number of Visits 24    Date for PT Re-Evaluation 05/07/23    Authorization Type [employer] $35 copay/50 visits per yr w/ OT/ST    PT Start Time 1015    PT Stop Time 1055    PT Time Calculation (min) 40 min    Activity Tolerance Patient tolerated treatment well    Behavior During Therapy WFL for tasks assessed/performed               Past Medical History:  Diagnosis Date   Deafness in right ear    Heart murmur    Hyperlipidemia    diet  controlled   Meniere disease    Post-operative nausea and vomiting    Vertigo    To ED Friday night 9-10- was given Meclazine - improved    Past Surgical History:  Procedure Laterality Date   ABDOMINAL HYSTERECTOMY     2 attempts/ unable to remove uterus/ had a lot bleeding until ablation was done   ABLATION     bowel reconstruction  2003   had adhesions/ removed part of small intestine   BREAST BIOPSY Left 2013   left/ precancerous lesion   CATARACT EXTRACTION     left eye   KIDNEY STONE SURGERY  2018   lithotripsy   TONSILLECTOMY     TUBAL LIGATION     ureter reconnection     URETERAL REIMPLANTION     Patient Active Problem List   Diagnosis Date Noted   Meralgia paresthetica of right side 01/31/2023   Elevated BP without diagnosis of hypertension 01/31/2023   Left Achilles tendinitis 01/19/2022   Left lower quadrant abdominal pain 08/29/2021   Intermittent small bowel obstruction due to adhesions (HCC) 08/29/2021   Constipation 08/29/2021   Elevated glucose 07/24/2021   Hospital discharge follow-up 07/24/2021   SBO (small bowel obstruction) (HCC) 07/11/2021   Elevated LFTs 07/11/2021   Hydronephrosis 07/11/2021   Vertigo 12/19/2020   Post-operative nausea and vomiting 12/19/2020    Deafness in right ear 12/19/2020   Vestibular hypofunction of both ears 06/03/2020   Mitral regurgitation mild based on the echo done in 2020 07/15/2019   Asymmetric SNHL (sensorineural hearing loss) 05/07/2019   Tinnitus of both ears 05/07/2019   Meniere's disease of left ear 02/10/2019   Elevated LDL cholesterol level 02/10/2019   Dyslipidemia 01/05/2019   Vitamin D deficiency 12/05/2018   DOE (dyspnea on exertion) 12/04/2018   Heart murmur 12/04/2018   Healthcare maintenance 12/04/2018    PCP: Dr Nadene Rubins  REFERRING PROVIDER: Dr Vernell Leep   REFERRING DIAG: pain R hip;anesthesia of skin  Rationale for Evaluation and Treatment: Rehabilitation  THERAPY DIAG:  Pain in right hip  Other symptoms and signs involving the musculoskeletal system  Muscle weakness (generalized)  ONSET DATE: 01/08/23  SUBJECTIVE:  SUBJECTIVE STATEMENT: Patient reports "I'm giving up". She states she feels the exercise is irritating her pain and the only thing that feels better is sitting and elevating her leg  PERTINENT HISTORY:  Achilles tendonitis; several abdominal surgeries last ~ 20 years; R kidney functions at 20%; bowel obstruction; Mnire's disease; heart murmer; deaf R ear; R hip dysplasia as infant?   PAIN:  Are you having pain? Yes: NPRS scale: 0/10 this morning (walking 4/10; standing > 2 hours 8/10)  Pain location: lateral hip down thigh to knee  Pain description: burning pain  Aggravating factors: walking; standing  Relieving factors: stretching it out; sitting   PRECAUTIONS: None   WEIGHT BEARING RESTRICTIONS: No  FALLS:  Has patient fallen in last 6 months? 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; household chores; walks dog ~ 30 min on unlevel surfaces in the neighborhood  PLOF: Independent  PATIENT GOALS: get rid of the pain   NEXT MD VISIT: 04/30/23  OBJECTIVE:   DIAGNOSTIC FINDINGS:  None for hip or LB  PATIENT SURVEYS:  FOTO   SENSATION: Intermittent numbness in the R lateral hip and thigh to knee    MUSCLE LENGTH: Hamstrings: Right 65 deg; Left 60 deg Thomas test: Right -10 deg; Left -5 deg  POSTURE: rounded shoulders, forward head, flexed trunk , and R LE in external rotation  PALPATION: Muscular tightness R >L hip flexors, quads; R posterior hip in gluts, piriformis  LUMBAR ROM:   AROM eval  Flexion 80%  Extension 30%  Right lateral flexion 70% discomfort  R hip   Left lateral flexion 75%  Right rotation 30%  Left rotation 30%   (Blank rows = not tested)  LOWER EXTREMITY ROM:     Active  Right eval Left eval  Hip flexion    Hip extension limited limited  Hip abduction    Hip adduction    Hip internal rotation    Hip external rotation    Knee flexion    Knee extension    Ankle dorsiflexion    Ankle plantarflexion    Ankle inversion    Ankle eversion     (Blank rows = not tested)  LOWER EXTREMITY MMT:    MMT Right eval Left eval  Hip flexion 4 4+  Hip extension 4 4  Hip abduction 4- 4  Hip adduction    Hip internal rotation    Hip external rotation    Knee flexion    Knee extension    Ankle dorsiflexion    Ankle plantarflexion    Ankle inversion    Ankle eversion     (Blank rows = not tested)  LUMBAR SPECIAL TESTS:  Straight leg raise test: Negative  FUNCTIONAL TESTS:  5 times sit to stand:  SLS R 2-3 sec; L 3-4 sec   GAIT: Distance walked: 40 Assistive device utilized: None Level of assistance: Complete Independence Comments: antalgic gait with R LE in some ER - decreased stance phase on  R   [facility] Adult PT Treatment:                                                DATE: 03/07/23 Therapeutic  Exercise: Nustep L7 x 5 min J curl x 5 Seated dead lift 10# x 10 Seated figure 4 stretch 3 x 30 sec SKTC 2 x 30 sec DKTC 2 x 30 sec Figure 4 LTR x10 Seated on physioball pelvic tilts laterally, A/P, CW/CCW Seated on physioball opp UE/LE lift, LAQ Wall squat 10 x 10 sec Captain morgans 10 x 3 sec hold bilat Manual Therapy: Manual Rt hip distraction 3 x 30 sec - no change in symptoms   [facility] Adult PT Treatment:                                                DATE: 03/05/2023 Therapeutic Exercise: NuStep L6 x Runner's lunge gastroc stretch (R) 2 x 30 sec Eccentric heels raises (R) 2x10 cues to slow down lowering Seated hip flexor stretch + overhead reaching with trunk rotation 2x30" (R) Lumbar ext at wall 10 sec hold x 5 increased tingling thigh; also did in standing Supine DKTC x 10 no change Prone lying decreases tingling Prone on elbows decreases tingling more; prone press ups x 10 abolishes  Manual Therapy: STM (R) lateral hamstrings & lateral gastroc Skilled palpation and monitoring of soft tissues during DN  Trigger Point Dry-Needling  Treatment instructions: Expect mild to moderate muscle soreness. S/S of pneumothorax if dry needled over a lung field, and to seek immediate medical attention should they occur. Patient verbalized understanding of these instructions and education. Patient Consent Given: Yes Education handout provided: Previously provided Muscles treated: R lateral quads and gastroc Electrical stimulation performed: No Parameters: N/A Treatment response/outcome: Twitch Response Elicited and Palpable Increase in Muscle Length   [facility] Adult PT Treatment:                                                DATE: 02/28/2023 Therapeutic Exercise: NuStep L6 x Standing self-massage with small yellow soft ball --> cross-friction along ITB/lateral quad Supine  HS & ITB stretches with strap 3x30" each Runner's luge gastroc stretch (R)  Eccentric heels raises (R) 2x10 Seated hip flexor stretch + overhead reaching with trunk rotation 2x30" (R) Manual Therapy: IASTM (R) distal hamstrings & lateral gastroc    [facility] Adult PT Treatment:                                                DATE: 02/26/2023 Therapeutic Exercise: NuStep L6 x 5 min Prone: Femoral nerve glides x10 Quad stretch w/strap (R) 3x30" (R) hip IR/ER Figure 4 LTR x10 S/L ITB stretch (back leg hanging off EOT) 2x30" Seated: HS stretch   Hip flexor stretch + overhead reaching with trunk rotation 2x30" (R) LAQ + ball squeeze hip add iso x10 Quarter wall squat + hip add ball squeeze 2x10 Bent over ITB standing stretch Manual Therapy: Roller stick lateral quad, ITB, distal lateral HS   PATIENT EDUCATION:  Education details: DN handout Person educated: Patient Education method: Programmer, multimedia, Facilities manager, Actor cues, Verbal cues, and Handouts Education comprehension: verbalized understanding, returned demonstration, verbal cues required, tactile cues required, and needs further education  HOME EXERCISE PROGRAM: Access Code:  [email or web address] URL: [email or web address] Date: 02/21/2023 Prepared by: Corlis Leak  Exercises - Hip Flexor Stretch at [facility] of Bed  - 2 x daily - 7 x weekly - 1 sets - 3 reps - 30 sec  hold - Seated Hip Flexor Stretch  - 2 x daily - 7 x weekly - 1 sets - 3 reps - 30 sec  hold - Supine Piriformis Stretch with Leg Straight  - 2 x daily - 7 x weekly - 1 sets - 3 reps - 30 sec  hold - Prone Quadriceps Stretch with Strap  - 2 x daily - 7 x weekly - 1 sets - 3 reps - 30 sec  hold - Prone Press Up  - 2 x daily - 7 x weekly - 1 sets - 10 reps - 2-3 sec  hold - Standing Hip Extension with Counter Support  - 2 x daily - 7 x weekly - 1-2 sets - 10 reps - 3-5 sec  hold - Standing Piriformis Release with Ball at [employer]  - 2 x daily - 7 x weekly - 30-60 sec   hold - Standing Bilateral Low Shoulder Row with Anchored Resistance  - 2 x daily - 7 x weekly - 1-3 sets - 10 reps - 2-3 sec  hold - Anti-Rotation Lateral Stepping with Press  - 2 x daily - 7 x weekly - 1-2 sets - 10 reps - 2-3 sec  hold - Side Stepping with Resistance at Thighs  - 1 x daily - 7 x weekly  Patient Education - Kinesiology tape  ASSESSMENT:  CLINICAL IMPRESSION: Modified program to attempt to reduce pt complaint of "irritation" after exercise. More flexion based activities performed this session. No increase in symptoms during treatment.  EVAL: Patient is a 67 y.o. female who was seen today for physical therapy evaluation and treatment for R hip pain with numbness in the R hip area. She presents with poor posture and alignment; limited standing and walking tolerance; decreased lumbar and hip mobility/ROM; LE weakness; muscular tightness to palpation; pain on a daily basis. Patient will benefit from PT to address problems identified.   GOALS: Goals reviewed with patient? Yes  SHORT TERM GOALS: Target date: 03/25/2023  Independent in initial HEP  Baseline: Goal status: INITIAL  2.  Increased standing and walking tolerance to 2-3 hours  Baseline:  Goal status: INITIAL   LONG TERM GOALS: Target date: 05/06/2023  Increase hip extension to 0 to (+)5-10 degrees  Baseline:  Goal status: INITIAL  2.  Increase LE strength to 4+/5 to 5/5  Baseline:  Goal status: INITIAL  3.  Improve functional standing time to 3 + hours for work  Baseline:  Goal status: INITIAL  4.  Decrease R hip pain by 75-100% allowing patient to return to all normal functional activities  Baseline:  Goal status: INITIAL  5.  Independent in HEP, including aquatic program as indicated  Baseline:  Goal status: INITIAL   PLAN:  PT FREQUENCY: 2x/week  PT DURATION: 12 weeks  PLANNED INTERVENTIONS: Therapeutic exercises, Therapeutic activity, Neuromuscular re-education, Balance training, Gait  training, Patient/Family education, Self Care, Joint mobilization, Aquatic Therapy, Dry Needling, Electrical stimulation, Spinal mobilization, Cryotherapy, Moist heat, Taping, Traction, Ultrasound, Ionotophoresis 4mg /ml Dexamethasone, Manual therapy, and Re-evaluation.  PLAN FOR NEXT SESSION:   Progress exercises; continue with back care education and instruction; manualwork, DN, modalities as indicated   PHYSICAL THERAPY DISCHARGE SUMMARY  Visits from Start of Care: 8  Current functional level related to goals /  functional outcomes: Increased activity tolerance   Remaining deficits: See above   Education / Equipment: HEP   Patient agrees to discharge. Patient goals were not met. Patient is being discharged due to not returning since the last visit.  Reggy Eye, PT,DPT02/21/259:55 AM   Reggy Eye, PT,DPT10/17/2410:56 AM

## 2023-03-12 ENCOUNTER — Ambulatory Visit (HOSPITAL_BASED_OUTPATIENT_CLINIC_OR_DEPARTMENT_OTHER)
Admission: RE | Admit: 2023-03-12 | Discharge: 2023-03-12 | Disposition: A | Discharge status: Home / Self Care | Payer: Federal, State, Local not specified - PPO | Source: Ambulatory Visit | Attending: Student | Admitting: Student

## 2023-03-12 ENCOUNTER — Encounter (HOSPITAL_BASED_OUTPATIENT_CLINIC_OR_DEPARTMENT_OTHER): Payer: Self-pay | Admitting: Student

## 2023-03-12 ENCOUNTER — Ambulatory Visit (HOSPITAL_BASED_OUTPATIENT_CLINIC_OR_DEPARTMENT_OTHER): Payer: Federal, State, Local not specified - PPO | Admitting: Student

## 2023-03-12 ENCOUNTER — Other Ambulatory Visit (HOSPITAL_BASED_OUTPATIENT_CLINIC_OR_DEPARTMENT_OTHER): Payer: Self-pay

## 2023-03-12 DIAGNOSIS — M25571 Pain in right ankle and joints of right foot: Secondary | ICD-10-CM

## 2023-03-12 DIAGNOSIS — R2681 Unsteadiness on feet: Secondary | ICD-10-CM | POA: Diagnosis not present

## 2023-03-12 DIAGNOSIS — M79671 Pain in right foot: Secondary | ICD-10-CM | POA: Diagnosis not present

## 2023-03-12 DIAGNOSIS — M7731 Calcaneal spur, right foot: Secondary | ICD-10-CM | POA: Diagnosis not present

## 2023-03-12 DIAGNOSIS — M2011 Hallux valgus (acquired), right foot: Secondary | ICD-10-CM | POA: Diagnosis not present

## 2023-03-12 NOTE — Progress Notes (Signed)
Chief Complaint: Right ankle pain     History of Present Illness:    Heather Villegas is a 67 y.o. female presenting today with pain in her right ankle.  She states that when she woke up yesterday her ankle felt tight.  She then had to be on her feet and walk a lot at work yesterday and by the evening her pain had increased significantly.  She does not recall any specific injury.  Pain is located on the lateral side of the ankle and is worsened with weightbearing.  She did have difficulty last night getting comfortable and therefore sleeping.  Has been going to physical therapy for her right hip and does not know if this aggravated her ankle.  Denies any previous injuries or surgeries to the ankle.  Surgical History:   None  PMH/PSH/Family History/Social History/Meds/Allergies:    Past Medical History:  Diagnosis Date   Deafness in right ear    Heart murmur    Hyperlipidemia    diet  controlled   Meniere disease    Post-operative nausea and vomiting    Vertigo    To ED Friday night 9-10- was given Meclazine - improved    Past Surgical History:  Procedure Laterality Date   ABDOMINAL HYSTERECTOMY     2 attempts/ unable to remove uterus/ had a lot bleeding until ablation was done   ABLATION     bowel reconstruction  2003   had adhesions/ removed part of small intestine   BREAST BIOPSY Left 2013   left/ precancerous lesion   CATARACT EXTRACTION     left eye   KIDNEY STONE SURGERY  2018   lithotripsy   TONSILLECTOMY     TUBAL LIGATION     ureter reconnection     URETERAL REIMPLANTION     Social History   Socioeconomic History   Marital status: Married    Spouse name: Not on file   Number of children: 3   Years of education: 12   Highest education level: 12th grade  Occupational History   Occupation: Conservation officer, historic buildings  Tobacco Use   Smoking status: Former    Current packs/day: 0.00    Types: Cigarettes    Quit date: 1987     Years since quitting: 37.8   Smokeless tobacco: Never  Vaping Use   Vaping status: Never Used  Substance and Sexual Activity   Alcohol use: Not Currently   Drug use: Never   Sexual activity: Not Currently    Comment: 1st intercourse- 20, partners- 4, married  Other Topics Concern   Not on file  Social History Narrative   Lives at home with her husband.   Right-handed.   1-2 cups caffeine per day.   Social Determinants of Health   Financial Resource Strain: Patient Declined (01/27/2023)   Overall Financial Resource Strain (CARDIA)    Difficulty of Paying Living Expenses: Patient declined  Food Insecurity: Patient Declined (01/27/2023)   Hunger Vital Sign    Worried About Running Out of Food in the Last Year: Patient declined    Ran Out of Food in the Last Year: Patient declined  Transportation Needs: No Transportation Needs (01/27/2023)   PRAPARE - Administrator, Civil Service (Medical): No    Lack of Transportation (Non-Medical): No  Physical  Activity: Sufficiently Active (01/27/2023)   Exercise Vital Sign    Days of Exercise per Week: 7 days    Minutes of Exercise per Session: 30 min  Stress: No Stress Concern Present (01/27/2023)   [employer] of Occupational Health - Occupational Stress Questionnaire    Feeling of Stress : Only a little  Social Connections: Unknown (02/04/2023)   [facility]   Social Network    Social Network: Not on file   Family History  Problem Relation Age of Onset   Lung cancer Mother    Lung cancer Father    Thrombosis Father    Drug abuse Brother    Colon cancer Neg Hx    Colon polyps Neg Hx    Esophageal cancer Neg Hx    Rectal cancer Neg Hx    Stomach cancer Neg Hx    Allergies  Allergen Reactions   Tape     Surgical/breaks down skin   Current Outpatient Medications  Medication Sig Dispense Refill   Ascorbic Acid (VITAMIN C PO) Take 2 capsules by mouth daily. Take 2 capsules daily     atorvastatin  (LIPITOR) 10 MG tablet Take 1 tablet (10 mg total) by mouth daily. 90 tablet 0   b complex vitamins tablet Take 1 tablet by mouth daily.     Coenzyme Q10 (CO Q 10 PO) Take 1 capsule by mouth daily.     Multiple Vitamin (MULTIVITAMIN) capsule Take 1 capsule by mouth daily.     Omega-3 Fatty Acids (FISH OIL) 1000 MG CAPS Take 1 capsule by mouth daily.     VITAMIN D PO Take 2 tablets by mouth daily.     No current facility-administered medications for this visit.   No results found.  Review of Systems:   A ROS was performed including pertinent positives and negatives as documented in the HPI.  Physical Exam :   Constitutional: NAD and appears stated age Neurological: Alert and oriented Psych: Appropriate affect and cooperative There were no vitals taken for this visit.   Comprehensive Musculoskeletal Exam:    Tenderness palpation of the right ankle over the lateral calcaneus inferior to the lateral malleolus.  No evidence of erythema or swelling.  No tenderness over the medial or lateral malleolus.  Active range of motion of the ankle to 20 degrees dorsiflexion 30 degrees plantarflexion.  Negative anterior drawer.  DP pulse 2+.  Distal sensation intact.  Imaging:   Xray (right foot 3 views, right ankle 3 views): No evidence of acute fracture or dislocation.  Os peroneum noted.   I personally reviewed and interpreted the radiographs.   Assessment:   67 y.o. female with atraumatic right ankle pain that began yesterday.  X-rays did not show any evidence of fracture, stress fracture or any other abnormality.  There is an os peroneum present however she is nontender over this area.  Given the location of her pain I do suspect that this is likely peroneal tendinitis.  She does have to stand and walk a lot for work so I will give her a short walking boot to use while active.  She is unable to take NSAIDs due to kidney issues so I have recommended use of Tylenol for pain and Voltaren gel over  the affected area.  Will plan to assess relief with this and can consider future referral to physical therapy if needed.  Plan :    - Given short walking boot today - Return to clinic as needed  I personally saw and evaluated the patient, and participated in the management and treatment plan.  Hazle Nordmann, PA-C Orthopedics

## 2023-03-13 ENCOUNTER — Ambulatory Visit: Payer: Federal, State, Local not specified - PPO | Admitting: Rehabilitative and Restorative Service Providers"

## 2023-03-13 ENCOUNTER — Ambulatory Visit (HOSPITAL_BASED_OUTPATIENT_CLINIC_OR_DEPARTMENT_OTHER): Payer: Federal, State, Local not specified - PPO | Admitting: Student

## 2023-03-15 ENCOUNTER — Ambulatory Visit: Payer: Federal, State, Local not specified - PPO | Admitting: Physical Therapy

## 2023-04-30 ENCOUNTER — Ambulatory Visit: Payer: Federal, State, Local not specified - PPO | Admitting: Family Medicine

## 2023-04-30 ENCOUNTER — Encounter: Payer: Self-pay | Admitting: Family Medicine

## 2023-04-30 VITALS — BP 124/68 | HR 63 | Temp 98.4°F | Ht 66.0 in | Wt 193.0 lb

## 2023-04-30 DIAGNOSIS — L821 Other seborrheic keratosis: Secondary | ICD-10-CM | POA: Diagnosis not present

## 2023-04-30 DIAGNOSIS — Z23 Encounter for immunization: Secondary | ICD-10-CM

## 2023-04-30 NOTE — Progress Notes (Signed)
Established Patient Office Visit   Subjective:  Patient ID: Heather Villegas, female    DOB: 06/01/1955  Age: 67 y.o. MRN: [id number]  Chief Complaint  Patient presents with   Medical Management of Chronic Issues    3 month follow up.    skin mole    Mole on right side of face that pt states is growing larger. X 6 months.     HPI Encounter Diagnoses  Name Primary?   Immunization due Yes   Seborrheic keratoses    For follow-up of elevated blood pressure that is now normalized.  Due for Prevnar 20.  Concerned about a mole on the right side of her face.   Review of Systems  Constitutional: Negative.   HENT: Negative.    Eyes:  Negative for blurred vision, discharge and redness.  Respiratory: Negative.    Cardiovascular: Negative.   Gastrointestinal:  Negative for abdominal pain.  Genitourinary: Negative.   Musculoskeletal: Negative.  Negative for myalgias.  Skin:  Negative for rash.  Neurological:  Negative for tingling, loss of consciousness and weakness.  Endo/Heme/Allergies:  Negative for polydipsia.     Current Outpatient Medications:    Ascorbic Acid (VITAMIN C PO), Take 2 capsules by mouth daily. Take 2 capsules daily, Disp: , Rfl:    atorvastatin (LIPITOR) 10 MG tablet, Take 1 tablet (10 mg total) by mouth daily., Disp: 90 tablet, Rfl: 0   b complex vitamins tablet, Take 1 tablet by mouth daily., Disp: , Rfl:    Coenzyme Q10 (CO Q 10 PO), Take 1 capsule by mouth daily., Disp: , Rfl:    Multiple Vitamin (MULTIVITAMIN) capsule, Take 1 capsule by mouth daily., Disp: , Rfl:    Omega-3 Fatty Acids (FISH OIL) 1000 MG CAPS, Take 1 capsule by mouth daily., Disp: , Rfl:    VITAMIN D PO, Take 2 tablets by mouth daily., Disp: , Rfl:    Objective:     BP 124/68   Pulse 63   Temp 98.4 F (36.9 C)   Ht 5\' 6"  (1.676 m)   Wt 193 lb (87.5 kg)   SpO2 98%   BMI 31.15 kg/m  BP Readings from Last 3 Encounters:  04/30/23 124/68  01/31/23 (!) 144/68  05/31/22 138/62   Wt  Readings from Last 3 Encounters:  04/30/23 193 lb (87.5 kg)  01/31/23 196 lb 9.6 oz (89.2 kg)  05/31/22 189 lb (85.7 kg)      Physical Exam Constitutional:      General: She is not in acute distress.    Appearance: Normal appearance. She is not ill-appearing, toxic-appearing or diaphoretic.  HENT:     Head: Normocephalic and atraumatic.     Right Ear: External ear normal.     Left Ear: External ear normal.  Eyes:     General: No scleral icterus.       Right eye: No discharge.        Left eye: No discharge.     Extraocular Movements: Extraocular movements intact.     Conjunctiva/sclera: Conjunctivae normal.  Pulmonary:     Effort: Pulmonary effort is normal. No respiratory distress.  Skin:    General: Skin is warm and dry.       Neurological:     Mental Status: She is alert and oriented to person, place, and time.  Psychiatric:        Mood and Affect: Mood normal.        Behavior: Behavior normal.  No results found for any visits on 04/30/23.    The 10-year ASCVD risk score (Arnett DK, et al., 2019) is: 4.8%    Assessment & Plan:   Immunization due -     Pneumococcal conjugate vaccine 20-valent  Seborrheic keratoses    Return in about 1 year (around 04/29/2024).  Prevnar 20 vaccine today.  Advised Shingrix vaccine.  Information on the Prevnar 20 was given.  Information on seborrheic keratoses and moles were also given.  Blood pressure has normalized.  Mliss Sax, MD

## 2023-05-10 ENCOUNTER — Other Ambulatory Visit: Payer: Self-pay | Admitting: Cardiology

## 2023-05-13 NOTE — Telephone Encounter (Signed)
Prescription sent to pharmacy.

## 2023-06-21 ENCOUNTER — Ambulatory Visit: Payer: Federal, State, Local not specified - PPO | Attending: Cardiology | Admitting: Cardiology

## 2023-06-21 ENCOUNTER — Encounter: Payer: Self-pay | Admitting: Cardiology

## 2023-06-21 VITALS — BP 138/70 | HR 94 | Ht 66.0 in | Wt 192.0 lb

## 2023-06-21 DIAGNOSIS — I340 Nonrheumatic mitral (valve) insufficiency: Secondary | ICD-10-CM | POA: Diagnosis not present

## 2023-06-21 DIAGNOSIS — E785 Hyperlipidemia, unspecified: Secondary | ICD-10-CM

## 2023-06-21 DIAGNOSIS — R0609 Other forms of dyspnea: Secondary | ICD-10-CM

## 2023-06-21 DIAGNOSIS — E7800 Pure hypercholesterolemia, unspecified: Secondary | ICD-10-CM

## 2023-06-21 DIAGNOSIS — R011 Cardiac murmur, unspecified: Secondary | ICD-10-CM

## 2023-06-21 NOTE — Patient Instructions (Addendum)
Medication Instructions:  Your physician recommends that you continue on your current medications as directed. Please refer to the Current Medication list given to you today.  *If you need a refill on your cardiac medications before your next appointment, please call your pharmacy*   Lab Work: None Ordered If you have labs (blood work) drawn today and your tests are completely normal, you will receive your results only by: MyChart Message (if you have MyChart) OR A paper copy in the mail If you have any lab test that is abnormal or we need to change your treatment, we will call you to review the results.   Testing/Procedures: We will order CT coronary calcium score. It will cost $99.00 and is not covered by insurance.  Please call to schedule.    [facility] [place] [address] [place], [place] [address] [phone]     Follow-Up: At [facility], you and your health needs are our priority.  As part of our continuing mission to provide you with exceptional heart care, we have created designated Provider Care Teams.  These Care Teams include your primary Cardiologist (physician) and Advanced Practice Providers (APPs -  Physician Assistants and Nurse Practitioners) who all work together to provide you with the care you need, when you need it.  We recommend signing up for the patient portal called "MyChart".  Sign up information is provided on this After Visit Summary.  MyChart is used to connect with patients for Virtual Visits (Telemedicine).  Patients are able to view lab/test results, encounter notes, upcoming appointments, etc.  Non-urgent messages can be sent to your provider as well.   To learn more about what you can do with MyChart, go to [email or web address].    Your next appointment:   12 month(s)  The format for your next appointment:   In Person  Provider:   Robert Krasowski, MD    Other Instructions NA  

## 2023-06-21 NOTE — Addendum Note (Signed)
Addended by: Baldo Ash D on: 06/21/2023 10:28 AM   Modules accepted: Orders

## 2023-06-21 NOTE — Progress Notes (Signed)
Cardiology Office Note:    Date:  06/21/2023   ID:  Heather Villegas, DOB 08-02-1955, MRN [id number]  PCP:  Mliss Sax, MD  Cardiologist:  Gypsy Balsam, MD    Referring MD: Mliss Sax,*   Chief Complaint  Patient presents with   Follow-up    History of Present Illness:    Heather Villegas is a 68 y.o. female past medical history significant for mitral regurgitation however last echocardiogram did not show any prior described mild, essential hypertension, dyslipidemia.  Comes today to months for follow-up overall doing well last time I seen her she was having prior with Achilles tendinosis as well as required some hospitalization because of bowel obstruction but recovered well try to walk on the regular basis with her dog have no difficulty doing this.  Past Medical History:  Diagnosis Date   Deafness in right ear    Heart murmur    Hyperlipidemia    diet  controlled   Meniere disease    Post-operative nausea and vomiting    Vertigo    To ED Friday night 9-10- was given Meclazine - improved     Past Surgical History:  Procedure Laterality Date   ABDOMINAL HYSTERECTOMY     2 attempts/ unable to remove uterus/ had a lot bleeding until ablation was done   ABLATION     bowel reconstruction  2003   had adhesions/ removed part of small intestine   BREAST BIOPSY Left 2013   left/ precancerous lesion   CATARACT EXTRACTION     left eye   KIDNEY STONE SURGERY  2018   lithotripsy   TONSILLECTOMY     TUBAL LIGATION     ureter reconnection     URETERAL REIMPLANTION      Current Medications: Current Meds  Medication Sig   Ascorbic Acid (VITAMIN C PO) Take 2 capsules by mouth daily. Take 2 capsules daily   atorvastatin (LIPITOR) 10 MG tablet Take 1 tablet (10 mg total) by mouth daily. Patient needs to keep appointment for 06/21/2023 for further refills   b complex vitamins tablet Take 1 tablet by mouth daily.   Coenzyme Q10 (CO Q 10 PO) Take 1 capsule by  mouth daily.   Multiple Vitamin (MULTIVITAMIN) capsule Take 1 capsule by mouth daily.   Omega-3 Fatty Acids (FISH OIL) 1000 MG CAPS Take 1 capsule by mouth daily.   VITAMIN D PO Take 2 tablets by mouth daily.     Allergies:   Tapentadol and Tape   Social History   Socioeconomic History   Marital status: Married    Spouse name: Not on file   Number of children: 3   Years of education: 12   Highest education level: 12th grade  Occupational History   Occupation: Conservation officer, historic buildings  Tobacco Use   Smoking status: Former    Current packs/day: 0.00    Types: Cigarettes    Quit date: 1987    Years since quitting: 38.1   Smokeless tobacco: Never  Vaping Use   Vaping status: Never Used  Substance and Sexual Activity   Alcohol use: Not Currently   Drug use: Never   Sexual activity: Not Currently    Comment: 1st intercourse- 20, partners- 4, married  Other Topics Concern   Not on file  Social History Narrative   Lives at home with her husband.   Right-handed.   1-2 cups caffeine per day.   Social Drivers of Corporate investment banker  Strain: Low Risk  (04/26/2023)   Overall Financial Resource Strain (CARDIA)    Difficulty of Paying Living Expenses: Not very hard  Food Insecurity: No Food Insecurity (04/26/2023)   Hunger Vital Sign    Worried About Running Out of Food in the Last Year: Never true    Ran Out of Food in the Last Year: Never true  Transportation Needs: No Transportation Needs (04/26/2023)   PRAPARE - Administrator, Civil Service (Medical): No    Lack of Transportation (Non-Medical): No  Physical Activity: Sufficiently Active (04/26/2023)   Exercise Vital Sign    Days of Exercise per Week: 7 days    Minutes of Exercise per Session: 30 min  Stress: Stress Concern Present (04/26/2023)   [employer] of Occupational Health - Occupational Stress Questionnaire    Feeling of Stress : To some extent  Social Connections: Moderately Integrated  (04/26/2023)   Social Connection and Isolation Panel [NHANES]    Frequency of Communication with Friends and Family: Once a week    Frequency of Social Gatherings with Friends and Family: Once a week    Attends Religious Services: More than 4 times per year    Active Member of [employer] or Organizations: Yes    Attends Engineer, structural: More than 4 times per year    Marital Status: Married     Family History: The patient's family history includes Drug abuse in her brother; Lung cancer in her father and mother; Thrombosis in her father. There is no history of Colon cancer, Colon polyps, Esophageal cancer, Rectal cancer, or Stomach cancer. ROS:   Please see the history of present illness.    All 14 point review of systems negative except as described per history of present illness  EKGs/Labs/Other Studies Reviewed:    EKG Interpretation Date/Time:  Friday June 21 2023 09:44:58 EST Ventricular Rate:  50 PR Interval:  194 QRS Duration:  90 QT Interval:  454 QTC Calculation: 413 R Axis:   87  Text Interpretation: Sinus bradycardia When compared with ECG of 11-Jul-2021 08:17, No significant change was found Confirmed by Gypsy Balsam [phone]) on 06/21/2023 9:48:31 AM    Recent Labs: 01/31/2023: ALT 20; BUN 14; Creatinine, Ser 0.68; Hemoglobin 13.7; Platelets 248.0; Potassium 4.1; Sodium 141  Recent Lipid Panel    Component Value Date/Time   CHOL 198 01/31/2023 0900   CHOL 232 (H) 12/26/2020 0953   TRIG 90.0 01/31/2023 0900   HDL 67.60 01/31/2023 0900   HDL 66 12/26/2020 0953   CHOLHDL 3 01/31/2023 0900   VLDL 18.0 01/31/2023 0900   LDLCALC 112 (H) 01/31/2023 0900   LDLCALC 146 (H) 12/26/2020 0953   LDLDIRECT 145.0 02/10/2019 0859    Physical Exam:    VS:  BP 138/70 (BP Location: Right Arm, Patient Position: Sitting)   Pulse 94   Ht 5\' 6"  (1.676 m)   Wt 192 lb (87.1 kg)   SpO2 95%   BMI 30.99 kg/m     Wt Readings from Last 3 Encounters:  06/21/23 192 lb  (87.1 kg)  04/30/23 193 lb (87.5 kg)  01/31/23 196 lb 9.6 oz (89.2 kg)     GEN:  Well nourished, well developed in no acute distress HEENT: Normal NECK: No JVD; No carotid bruits LYMPHATICS: No lymphadenopathy CARDIAC: RRR, no murmurs, no rubs, no gallops RESPIRATORY:  Clear to auscultation without rales, wheezing or rhonchi  ABDOMEN: Soft, non-tender, non-distended MUSCULOSKELETAL:  No edema; No deformity  SKIN: Warm and  dry LOWER EXTREMITIES: no swelling NEUROLOGIC:  Alert and oriented x 3 PSYCHIATRIC:  Normal affect   ASSESSMENT:    1. Dyspnea on exertion   2. Nonrheumatic mitral valve regurgitation   3. Dyslipidemia   4. Elevated LDL cholesterol level   5. Heart murmur    PLAN:    In order of problems listed above:  Mitral regurgitation I do not hear a murmur today last echocardiac did not show significant mitral regurgitation will continue monitoring. Dyspnea exertion.  Doing well from that point to be doing fine. Dyslipidemia I did review K PN which show me her LDL of 172 HDL 74.  I will do calcium score and her try to decide how aggressive we need to be with medications. Heart murmur I cannot hear any today   Medication Adjustments/Labs and Tests Ordered: Current medicines are reviewed at length with the patient today.  Concerns regarding medicines are outlined above.  Orders Placed This Encounter  Procedures   EKG 12-Lead   Medication changes: No orders of the defined types were placed in this encounter.   Signed, Georgeanna Lea, MD, [facility] 06/21/2023 10:22 AM    [facility]

## 2023-06-24 ENCOUNTER — Other Ambulatory Visit: Payer: Self-pay | Admitting: Cardiology

## 2023-06-28 ENCOUNTER — Ambulatory Visit (HOSPITAL_BASED_OUTPATIENT_CLINIC_OR_DEPARTMENT_OTHER)
Admission: RE | Admit: 2023-06-28 | Discharge: 2023-06-28 | Disposition: A | Discharge status: Home / Self Care | Payer: Self-pay | Source: Ambulatory Visit | Attending: Cardiology | Admitting: Cardiology

## 2023-06-28 DIAGNOSIS — E7800 Pure hypercholesterolemia, unspecified: Secondary | ICD-10-CM | POA: Insufficient documentation

## 2023-07-31 ENCOUNTER — Emergency Department (HOSPITAL_BASED_OUTPATIENT_CLINIC_OR_DEPARTMENT_OTHER)

## 2023-07-31 ENCOUNTER — Other Ambulatory Visit: Payer: Self-pay

## 2023-07-31 ENCOUNTER — Encounter (HOSPITAL_BASED_OUTPATIENT_CLINIC_OR_DEPARTMENT_OTHER): Payer: Self-pay | Admitting: Emergency Medicine

## 2023-07-31 ENCOUNTER — Emergency Department (HOSPITAL_BASED_OUTPATIENT_CLINIC_OR_DEPARTMENT_OTHER)
Admission: EM | Admit: 2023-07-31 | Discharge: 2023-07-31 | Disposition: A | Discharge status: Home / Self Care | Attending: Emergency Medicine | Admitting: Emergency Medicine

## 2023-07-31 DIAGNOSIS — K5732 Diverticulitis of large intestine without perforation or abscess without bleeding: Secondary | ICD-10-CM | POA: Diagnosis not present

## 2023-07-31 DIAGNOSIS — Z79899 Other long term (current) drug therapy: Secondary | ICD-10-CM | POA: Diagnosis not present

## 2023-07-31 DIAGNOSIS — R109 Unspecified abdominal pain: Secondary | ICD-10-CM | POA: Diagnosis present

## 2023-07-31 DIAGNOSIS — K5792 Diverticulitis of intestine, part unspecified, without perforation or abscess without bleeding: Secondary | ICD-10-CM

## 2023-07-31 DIAGNOSIS — K5730 Diverticulosis of large intestine without perforation or abscess without bleeding: Secondary | ICD-10-CM | POA: Insufficient documentation

## 2023-07-31 LAB — COMPREHENSIVE METABOLIC PANEL
ALT: 21 U/L (ref 0–44)
AST: 21 U/L (ref 15–41)
Albumin: 3.9 g/dL (ref 3.5–5.0)
Alkaline Phosphatase: 73 U/L (ref 38–126)
Anion gap: 8 (ref 5–15)
BUN: 20 mg/dL (ref 8–23)
CO2: 23 mmol/L (ref 22–32)
Calcium: 9 mg/dL (ref 8.9–10.3)
Chloride: 106 mmol/L (ref 98–111)
Creatinine, Ser: 0.73 mg/dL (ref 0.44–1.00)
GFR, Estimated: >60 mL/min (ref >60)
Glucose, Bld: 111 mg/dL — ABNORMAL HIGH (ref 70–99)
Potassium: 3.8 mmol/L (ref 3.5–5.1)
Sodium: 137 mmol/L (ref 135–145)
Total Bilirubin: 0.6 mg/dL (ref 0.0–1.2)
Total Protein: 7 g/dL (ref 6.5–8.1)

## 2023-07-31 LAB — CBC
HCT: 39.3 % (ref 36.0–46.0)
Hemoglobin: 13.3 g/dL (ref 12.0–15.0)
MCH: 29.6 pg (ref 26.0–34.0)
MCHC: 33.8 g/dL (ref 30.0–36.0)
MCV: 87.3 fL (ref 80.0–100.0)
Platelets: 304 10*3/uL (ref 150–400)
RBC: 4.5 MIL/uL (ref 3.87–5.11)
RDW: 14.2 % (ref 11.5–15.5)
WBC: 6.8 10*3/uL (ref 4.0–10.5)
nRBC: 0 % (ref 0.0–0.2)

## 2023-07-31 LAB — URINALYSIS, ROUTINE W REFLEX MICROSCOPIC
Bilirubin Urine: NEGATIVE
Glucose, UA: NEGATIVE mg/dL
Hgb urine dipstick: NEGATIVE
Ketones, ur: NEGATIVE mg/dL
Leukocytes,Ua: NEGATIVE
Nitrite: NEGATIVE
Protein, ur: NEGATIVE mg/dL
Specific Gravity, Urine: 1.01 (ref 1.005–1.030)
pH: 6 (ref 5.0–8.0)

## 2023-07-31 LAB — LIPASE, BLOOD: Lipase: 29 U/L (ref 11–51)

## 2023-07-31 MED ORDER — CIPROFLOXACIN HCL 500 MG PO TABS
500.0000 mg | ORAL_TABLET | Freq: Once | ORAL | Status: AC
  Administered 2023-07-31: 500 mg via ORAL
  Filled 2023-07-31: qty 1

## 2023-07-31 MED ORDER — MORPHINE SULFATE (PF) 4 MG/ML IV SOLN
4.0000 mg | Freq: Once | INTRAVENOUS | Status: AC
  Administered 2023-07-31: 4 mg via INTRAVENOUS
  Filled 2023-07-31: qty 1

## 2023-07-31 MED ORDER — IOHEXOL 300 MG/ML  SOLN
100.0000 mL | Freq: Once | INTRAMUSCULAR | Status: AC | PRN
  Administered 2023-07-31: 100 mL via INTRAVENOUS

## 2023-07-31 MED ORDER — SODIUM CHLORIDE 0.9 % IV BOLUS
1000.0000 mL | Freq: Once | INTRAVENOUS | Status: AC
  Administered 2023-07-31: 1000 mL via INTRAVENOUS

## 2023-07-31 MED ORDER — HYDROCODONE-ACETAMINOPHEN 5-325 MG PO TABS: 1.0000 | ORAL_TABLET | Freq: Four times a day (QID) | ORAL | 0 refills | Status: DC | PRN

## 2023-07-31 MED ORDER — ONDANSETRON HCL 4 MG/2ML IJ SOLN
4.0000 mg | Freq: Once | INTRAMUSCULAR | Status: AC
  Administered 2023-07-31: 4 mg via INTRAVENOUS
  Filled 2023-07-31: qty 2

## 2023-07-31 MED ORDER — METRONIDAZOLE 500 MG PO TABS
500.0000 mg | ORAL_TABLET | Freq: Once | ORAL | Status: AC
  Administered 2023-07-31: 500 mg via ORAL
  Filled 2023-07-31: qty 1

## 2023-07-31 MED ORDER — METRONIDAZOLE 500 MG PO TABS: 500.0000 mg | ORAL_TABLET | Freq: Three times a day (TID) | ORAL | 0 refills | Status: DC

## 2023-07-31 MED ORDER — CIPROFLOXACIN HCL 500 MG PO TABS: 500.0000 mg | ORAL_TABLET | Freq: Two times a day (BID) | ORAL | 0 refills | Status: DC

## 2023-07-31 NOTE — ED Triage Notes (Addendum)
 Pt via POV c/o lower abd pain x 2 days with some indigestion. Denies n/v/d, fevers, chills, body aches, and blood in stool. No urinary symptoms. Pain intermittently rated 7/10. Hx SBO with similar presentation.

## 2023-07-31 NOTE — Discharge Instructions (Signed)
Begin taking Cipro and Flagyl as prescribed.  Begin taking hydrocodone as prescribed as needed for pain.  Return to the emergency department if you develop worsening pain, high fevers, bloody stools, or for other new and concerning symptoms.

## 2023-07-31 NOTE — ED Provider Notes (Signed)
 [place] [facility] Provider Note   CSN: [id number] Arrival date & time: 07/31/23  0040     History  Chief Complaint  Patient presents with   Abdominal Pain    Heather Villegas is a 68 y.o. female.  Patient is a 68 year old female with history of previous bowel resection, hysterectomy, and urologic surgery.  Patient with history of adhesions and prior small bowel obstruction.  She presents today with complaints of abdominal pain and bloating.  She has not vomited and does report having a bowel movement today.  Symptoms have been worsening over the past 2 days.  No fevers or chills.  Pain worse with movement and palpation with no alleviating factors.  The history is provided by the patient.       Home Medications Prior to Admission medications   Medication Sig Start Date End Date Taking? Authorizing Provider  Ascorbic Acid (VITAMIN C PO) Take 2 capsules by mouth daily. Take 2 capsules daily    [provider]  atorvastatin (LIPITOR) 10 MG tablet TAKE ONE TABLET BY MOUTH ONE TIME DAILY **MUST CALL DR. FOR APPOINTMENT** 06/25/23   Georgeanna Lea, MD  b complex vitamins tablet Take 1 tablet by mouth daily.    [provider]  Coenzyme Q10 (CO Q 10 PO) Take 1 capsule by mouth daily.    [provider]  Multiple Vitamin (MULTIVITAMIN) capsule Take 1 capsule by mouth daily.    [provider]  Omega-3 Fatty Acids (FISH OIL) 1000 MG CAPS Take 1 capsule by mouth daily.    [provider]  VITAMIN D PO Take 2 tablets by mouth daily.    [provider]      Allergies    Tapentadol and Tape    Review of Systems   Review of Systems  All other systems reviewed and are negative.   Physical Exam Updated Vital Signs BP (!) 153/69 (BP Location: Left Arm)   Pulse 68   Temp 97.8 F (36.6 C) (Temporal)   Resp 14   Ht 5\' 6"  (1.676 m)   Wt 86.2 kg   SpO2 93%   BMI 30.67 kg/m  Physical  Exam Vitals and nursing note reviewed.  Constitutional:      General: She is not in acute distress.    Appearance: She is well-developed. She is not diaphoretic.  HENT:     Head: Normocephalic and atraumatic.  Cardiovascular:     Rate and Rhythm: Normal rate and regular rhythm.     Heart sounds: No murmur heard.    No friction rub. No gallop.  Pulmonary:     Effort: Pulmonary effort is normal. No respiratory distress.     Breath sounds: Normal breath sounds. No wheezing.  Abdominal:     General: Bowel sounds are normal. There is no distension.     Palpations: Abdomen is soft.     Tenderness: There is generalized abdominal tenderness. There is no right CVA tenderness, left CVA tenderness, guarding or rebound.  Musculoskeletal:        General: Normal range of motion.     Cervical back: Normal range of motion and neck supple.  Skin:    General: Skin is warm and dry.  Neurological:     General: No focal deficit present.     Mental Status: She is alert and oriented to person, place, and time.     ED Results / Procedures / Treatments   Labs (all  labs ordered are listed, but only abnormal results are displayed) Labs Reviewed  COMPREHENSIVE METABOLIC PANEL - Abnormal; Notable for the following components:      Result Value   Glucose, Bld 111 (*)    All other components within normal limits  LIPASE, BLOOD  CBC  URINALYSIS, ROUTINE W REFLEX MICROSCOPIC    EKG None  Radiology No results found.  Procedures Procedures    Medications Ordered in ED Medications  sodium chloride 0.9 % bolus 1,000 mL (has no administration in time range)  ondansetron (ZOFRAN) injection 4 mg (has no administration in time range)  morphine (PF) 4 MG/ML injection 4 mg (has no administration in time range)    ED Course/ Medical Decision Making/ A&P  Patient is a 68 year old female presenting with complaints of abdominal pain as described in the HPI.  She has history of adhesions and bowel  obstruction and this feels similar.  Patient arrives with stable vital signs and is afebrile.  Physical examination reveals mild generalized tenderness, but no peritoneal signs.  Laboratory studies obtained including CBC, CMP, and lipase.  She has no leukocytosis, no abnormalities in her liver or pancreatic enzymes, and no electrolyte derangement.  Urinalysis is clear.  CT scan of the abdomen and pelvis obtained showing sigmoid diverticulosis with a small area of acute diverticulitis.    Patient was hydrated with normal saline and given morphine for pain and Zofran for nausea and seems to be feeling better.  She will be treated with Cipro and Flagyl, pain medication, and follow-up as needed.  Final Clinical Impression(s) / ED Diagnoses Final diagnoses:  None    Rx / DC Orders ED Discharge Orders     None         Geoffery Lyons, MD 07/31/23 [phone]

## 2023-08-05 ENCOUNTER — Emergency Department (HOSPITAL_BASED_OUTPATIENT_CLINIC_OR_DEPARTMENT_OTHER)

## 2023-08-05 ENCOUNTER — Other Ambulatory Visit: Payer: Self-pay

## 2023-08-05 ENCOUNTER — Encounter (HOSPITAL_BASED_OUTPATIENT_CLINIC_OR_DEPARTMENT_OTHER): Payer: Self-pay | Admitting: *Deleted

## 2023-08-05 ENCOUNTER — Emergency Department (HOSPITAL_BASED_OUTPATIENT_CLINIC_OR_DEPARTMENT_OTHER): Admission: EM | Admit: 2023-08-05 | Discharge: 2023-08-05 | Disposition: A | Discharge status: Home / Self Care

## 2023-08-05 DIAGNOSIS — R002 Palpitations: Secondary | ICD-10-CM | POA: Diagnosis not present

## 2023-08-05 DIAGNOSIS — R42 Dizziness and giddiness: Secondary | ICD-10-CM | POA: Diagnosis not present

## 2023-08-05 DIAGNOSIS — R0789 Other chest pain: Secondary | ICD-10-CM | POA: Insufficient documentation

## 2023-08-05 LAB — BASIC METABOLIC PANEL
Anion gap: 10 (ref 5–15)
BUN: 17 mg/dL (ref 8–23)
CO2: 23 mmol/L (ref 22–32)
Calcium: 9.6 mg/dL (ref 8.9–10.3)
Chloride: 104 mmol/L (ref 98–111)
Creatinine, Ser: 0.78 mg/dL (ref 0.44–1.00)
GFR, Estimated: >60 mL/min (ref >60)
Glucose, Bld: 91 mg/dL (ref 70–99)
Potassium: 3.8 mmol/L (ref 3.5–5.1)
Sodium: 137 mmol/L (ref 135–145)

## 2023-08-05 LAB — CBC
HCT: 43.8 % (ref 36.0–46.0)
Hemoglobin: 14.9 g/dL (ref 12.0–15.0)
MCH: 29.9 pg (ref 26.0–34.0)
MCHC: 34 g/dL (ref 30.0–36.0)
MCV: 87.8 fL (ref 80.0–100.0)
Platelets: 288 10*3/uL (ref 150–400)
RBC: 4.99 MIL/uL (ref 3.87–5.11)
RDW: 14.3 % (ref 11.5–15.5)
WBC: 6.7 10*3/uL (ref 4.0–10.5)
nRBC: 0 % (ref 0.0–0.2)

## 2023-08-05 LAB — TROPONIN I (HIGH SENSITIVITY)
Troponin I (High Sensitivity): 4 ng/L (ref <18)
Troponin I (High Sensitivity): 4 ng/L (ref <18)

## 2023-08-05 MED ORDER — AMLODIPINE BESYLATE 5 MG PO TABS: 5.0000 mg | ORAL_TABLET | Freq: Every day | ORAL | 0 refills | Status: DC

## 2023-08-05 MED ORDER — AMLODIPINE BESYLATE 5 MG PO TABS
5.0000 mg | ORAL_TABLET | Freq: Once | ORAL | Status: AC
  Administered 2023-08-05: 5 mg via ORAL
  Filled 2023-08-05: qty 1

## 2023-08-05 MED ORDER — BUTALBITAL-APAP-CAFFEINE 50-325-40 MG PO TABS: 1.0000 | ORAL_TABLET | Freq: Once | ORAL | Status: DC

## 2023-08-05 MED ORDER — DIPHENHYDRAMINE HCL 50 MG/ML IJ SOLN
12.5000 mg | Freq: Once | INTRAMUSCULAR | Status: AC
  Administered 2023-08-05: 12.5 mg via INTRAVENOUS
  Filled 2023-08-05: qty 1

## 2023-08-05 MED ORDER — ACETAMINOPHEN 325 MG PO TABS
650.0000 mg | ORAL_TABLET | Freq: Once | ORAL | Status: AC
  Administered 2023-08-05: 650 mg via ORAL
  Filled 2023-08-05: qty 2

## 2023-08-05 MED ORDER — PROCHLORPERAZINE EDISYLATE 10 MG/2ML IJ SOLN
5.0000 mg | Freq: Once | INTRAMUSCULAR | Status: AC
  Administered 2023-08-05: 5 mg via INTRAVENOUS
  Filled 2023-08-05: qty 2

## 2023-08-05 MED ORDER — LACTATED RINGERS IV BOLUS
500.0000 mL | Freq: Once | INTRAVENOUS | Status: AC
  Administered 2023-08-05: 500 mL via INTRAVENOUS

## 2023-08-05 NOTE — ED Provider Notes (Signed)
 [place] [facility] Provider Note   CSN: [id number] Arrival date & time: 08/05/23  1525     History  Chief Complaint  Patient presents with   Chest Pain    Heather Villegas is a 68 y.o. female.  68 year old female with past medical history of hyperlipidemia who was recently diagnosed with diverticulitis last week presenting to the emergency department today with palpitations, lightheadedness, and pressure in the center of her back.  The patient states that this occurred while she was at work.  She states that her Apple Watch was giving her messages that her heart rate was elevated.  She came to the emergency department at that time for further evaluation.  She states she is feeling little bit better.  She denies any nausea or vomiting recently.  She denies any fevers.  She states that her abdominal pain from the diverticulitis has improved significantly.  She is currently on ciprofloxacin and Flagyl.  Denies being on any new medications other than this.   Chest Pain      Home Medications Prior to Admission medications   Medication Sig Start Date End Date Taking? Authorizing Provider  amLODipine (NORVASC) 5 MG tablet Take 1 tablet (5 mg total) by mouth daily. 08/05/23  Yes Durwin Glaze, MD  Ascorbic Acid (VITAMIN C PO) Take 2 capsules by mouth daily. Take 2 capsules daily    [provider]  atorvastatin (LIPITOR) 10 MG tablet TAKE ONE TABLET BY MOUTH ONE TIME DAILY **MUST CALL DR. FOR APPOINTMENT** 06/25/23   Georgeanna Lea, MD  b complex vitamins tablet Take 1 tablet by mouth daily.    [provider]  ciprofloxacin (CIPRO) 500 MG tablet Take 1 tablet (500 mg total) by mouth 2 (two) times daily. One po bid x 7 days 07/31/23   Geoffery Lyons, MD  Coenzyme Q10 (CO Q 10 PO) Take 1 capsule by mouth daily.    [provider]  HYDROcodone-acetaminophen (NORCO/VICODIN) 5-325 MG tablet Take 1-2 tablets by mouth every 6 (six)  hours as needed. 07/31/23   Geoffery Lyons, MD  metroNIDAZOLE (FLAGYL) 500 MG tablet Take 1 tablet (500 mg total) by mouth 3 (three) times daily. One po tid x 7 days 07/31/23   Geoffery Lyons, MD  Multiple Vitamin (MULTIVITAMIN) capsule Take 1 capsule by mouth daily.    [provider]  Omega-3 Fatty Acids (FISH OIL) 1000 MG CAPS Take 1 capsule by mouth daily.    [provider]  VITAMIN D PO Take 2 tablets by mouth daily.    [provider]      Allergies    Tapentadol and Tape    Review of Systems   Review of Systems  Cardiovascular:  Positive for chest pain.  All other systems reviewed and are negative.   Physical Exam Updated Vital Signs BP (!) 142/52   Pulse (!) 59   Temp 98 F (36.7 C)   Resp 15   SpO2 95%  Physical Exam Vitals and nursing note reviewed.   Gen: NAD Eyes: PERRL, EOMI HEENT: no oropharyngeal swelling Neck: trachea midline Resp: clear to auscultation bilaterally Card: RRR, no murmurs, rubs, or gallops Abd: nontender, nondistended Extremities: no calf tenderness, no edema Vascular: 2+ radial pulses bilaterally, 2+ DP pulses bilaterally Neuro: No focal deficits Skin: no rashes Psyc: acting appropriately   ED Results / Procedures / Treatments   Labs (all labs ordered are listed, but only abnormal results are displayed) Labs  Reviewed  BASIC METABOLIC PANEL  CBC  TROPONIN I (HIGH SENSITIVITY)  TROPONIN I (HIGH SENSITIVITY)    EKG None  Radiology CT Head Wo Contrast Result Date: 08/05/2023 CLINICAL DATA:  Provided history: Headache, increasing frequency or severity. EXAM: EXAM CT HEAD WITHOUT CONTRAST TECHNIQUE: Contiguous axial images were obtained from the base of the skull through the vertex without intravenous contrast. RADIATION DOSE REDUCTION: This exam was performed according to the departmental dose-optimization program which includes automated exposure control, adjustment of the mA and/or kV according to patient  size and/or use of iterative reconstruction technique. COMPARISON:  Head CT 05/01/2019. FINDINGS: Brain: Cerebral volume is normal. Partially empty sella turcica. Mild left cerebellar tonsillar ectopia. There is no acute intracranial hemorrhage. No demarcated cortical infarct. No extra-axial fluid collection. No evidence of an intracranial mass. No midline shift. Vascular: No hyperdense vessel.  Atherosclerotic calcifications. Skull: No calvarial fracture or aggressive osseous lesion. Sinuses/Orbits: No mass or acute finding within the imaged orbits. No significant paranasal sinus disease. IMPRESSION: 1.  No evidence of an acute intracranial abnormality. 2. Partially empty sella turcica. This finding can reflect incidental anatomic variation, or alternatively, it can be associated with chronic idiopathic intracranial hypertension (pseudotumor cerebri). 3. Mild left cerebellar tonsillar ectopia. Electronically Signed   By: Jackey Loge D.O.   On: 08/05/2023 20:57   DG Chest 2 View Result Date: 08/05/2023 CLINICAL DATA:  Chest pain. EXAM: CHEST - 2 VIEW COMPARISON:  Chest radiograph dated 07/11/2021. FINDINGS: No focal consolidation, pleural effusion or pneumothorax. The cardiac silhouette is within limits. No acute osseous pathology. IMPRESSION: No active cardiopulmonary disease. Electronically Signed   By: Elgie Collard M.D.   On: 08/05/2023 16:49    Procedures Procedures    Medications Ordered in ED Medications  amLODipine (NORVASC) tablet 5 mg (5 mg Oral Given 08/05/23 1622)  lactated ringers bolus 500 mL (0 mLs Intravenous Stopped 08/05/23 1736)  acetaminophen (TYLENOL) tablet 650 mg (650 mg Oral Given 08/05/23 1801)  prochlorperazine (COMPAZINE) injection 5 mg (5 mg Intravenous Given 08/05/23 1850)  diphenhydrAMINE (BENADRYL) injection 12.5 mg (12.5 mg Intravenous Given 08/05/23 1852)    ED Course/ Medical Decision Making/ A&P                                 Medical Decision  Making 68 year old female with past medical history of hyperlipidemia presenting to the emergency department today with lightheadedness and thoracic pressure/discomfort.  I will further evaluate patient here with basic labs Wels and EKG, chest x-ray, troponin for further evaluation for atypical ACS, pulmonary edema, pulmonary infiltrates, or pneumothorax.  Looking back through her chart it does appear the patient has been hypertensive on a few occasions here.  Will give her a dose of amlodipine here as her blood pressure is elevated here on arrival in the 180s.  The patient is neurovascular intact with reassuring evaluation and without any severe or sharp pain suspicion for aortic dissection is low at this time.  Also doubt this is due to pulmonary embolism with reassuring vital signs and no hypoxia or complaints of shortness of breath.  I give patient IV fluids as well in the event this due to hypovolemia.  Will reevaluate for ultimate disposition.  The patient's EKG interpreted by me shows a sinus rhythm with a rate of 82 with normal axis, normal intervals, and T wave inversions in the inferior and lateral leads.  This does appear similar in  morphology to previous EKGs in our system.  The patient had 2 troponins here that are negative.  She was complaining of a headache so CT scan is ordered.  This shows empty sella but no other acute findings.  The patient's headache resolved with medications here.  She is discharged with return precautions.  Amount and/or Complexity of Data Reviewed Labs: ordered. Radiology: ordered.  Risk OTC drugs. Prescription drug management.           Final Clinical Impression(s) / ED Diagnoses Final diagnoses:  Lightheadedness  Chest pressure    Rx / DC Orders ED Discharge Orders          Ordered    amLODipine (NORVASC) 5 MG tablet  Daily        08/05/23 2148              Durwin Glaze, MD 08/05/23 2148

## 2023-08-05 NOTE — Discharge Instructions (Addendum)
 Your workup today was reassuring overall.  Please talk the amlodipine daily and check your blood pressures.  Please follow-up with your doctor to have this rechecked.  Return to the ER for worsening symptoms.

## 2023-08-05 NOTE — ED Notes (Signed)
 Patient transported to X-ray

## 2023-08-05 NOTE — ED Triage Notes (Signed)
 Pt is here for tightness in back and chest which began about 2 hours ago.  Pt states that it feels like her heart is pumping fast.  No sob.

## 2023-08-27 ENCOUNTER — Encounter: Payer: Self-pay | Admitting: Nurse Practitioner

## 2023-08-27 ENCOUNTER — Ambulatory Visit: Attending: Nurse Practitioner | Admitting: Cardiology

## 2023-08-27 VITALS — BP 140/70 | HR 49 | Ht 66.0 in | Wt 194.0 lb

## 2023-08-27 DIAGNOSIS — I340 Nonrheumatic mitral (valve) insufficiency: Secondary | ICD-10-CM | POA: Diagnosis not present

## 2023-08-27 DIAGNOSIS — I10 Essential (primary) hypertension: Secondary | ICD-10-CM

## 2023-08-27 DIAGNOSIS — E785 Hyperlipidemia, unspecified: Secondary | ICD-10-CM | POA: Diagnosis not present

## 2023-08-27 LAB — LIPID PANEL
Chol/HDL Ratio: 3.3 ratio (ref 0.0–4.4)
Cholesterol, Total: 210 mg/dL — ABNORMAL HIGH (ref 100–199)
HDL: 64 mg/dL (ref >39)
LDL Chol Calc (NIH): 117 mg/dL — ABNORMAL HIGH (ref 0–99)
Triglycerides: 167 mg/dL — ABNORMAL HIGH (ref 0–149)
VLDL Cholesterol Cal: 29 mg/dL (ref 5–40)

## 2023-08-27 MED ORDER — AMLODIPINE BESYLATE 10 MG PO TABS: 10.0000 mg | ORAL_TABLET | Freq: Every day | ORAL | 11 refills | Status: AC

## 2023-08-27 NOTE — Patient Instructions (Addendum)
 Medication Instructions:  Increase Amlodipine 10 mg daily  *If you need a refill on your cardiac medications before your next appointment, please call your pharmacy*  Lab Work: Fasting Lipid panel today  Testing/Procedures: NONE ordered at this time of appointment   Follow-Up: At [facility], you and your health needs are our priority.  As part of our continuing mission to provide you with exceptional heart care, our providers are all part of one team.  This team includes your primary Cardiologist (physician) and Advanced Practice Providers or APPs (Physician Assistants and Nurse Practitioners) who all work together to provide you with the care you need, when you need it.  Your next appointment:   1 year(s)  Provider:   Gypsy Balsam, MD    We recommend signing up for the patient portal called "MyChart".  Sign up information is provided on this After Visit Summary.  MyChart is used to connect with patients for Virtual Visits (Telemedicine).  Patients are able to view lab/test results, encounter notes, upcoming appointments, etc.  Non-urgent messages can be sent to your provider as well.   To learn more about what you can do with MyChart, go to [email or web address].   Other Instructions Monitor blood pressure twice daily for 2 weeks. Report systolic BP (top number) consistently greater than 140.       1st Floor: - Lobby - Registration  - Pharmacy  - Lab - Cafe  2nd Floor: - PV Lab - Diagnostic Testing (echo, CT, nuclear med)  3rd Floor: - Vacant  4th Floor: - TCTS (cardiothoracic surgery) - AFib Clinic - Structural Heart Clinic - Vascular Surgery  - Vascular Ultrasound  5th Floor: - HeartCare Cardiology (general and EP) - Clinical Pharmacy for coumadin, hypertension, lipid, weight-loss medications, and med management appointments    Valet parking services will be available as well.

## 2023-08-27 NOTE — Progress Notes (Signed)
 Cardiology Office Note:  .   Date:  08/27/2023  ID:  Heather Villegas, DOB 08/15/1955, MRN [id number] PCP: Mliss Sax, MD  [facility] Providers Cardiologist:  Gypsy Balsam, MD { \ History of Present Illness: [name]   Heather Villegas is a 68 y.o. female with history of mitral regurgitation, hypertension, hyperlipidemia, bradycardia, bowel resection.  Cardiology was primarily following due to history of mitral regurgitation however most recent echocardiogram in January 2024 did not show any.  Previously mild.  Last seen in office January 2025 overall feeling well.  She has been seen twice in March 2025 in the ED with no admissions.  Once for acute diverticulitis. Again 1 to 2 weeks later for complaints of palpitations, lightheadedness, back pressure.  Reportedly Apple watch showed elevated heart rates.  Workup unremarkable, given IV fluids for hypovolemia.  Blood pressure was elevated so she was started on amlodipine 5 mg. Also CAC score of 0 06/2023.  She initially had 1 year follow-up planned but given her hypertension and elevated heart rates she felt cardiology follow-up was most appropriate.  Since that time she does report some intermittent fatigue but all this has been surrounding her acute illness and diverticulitis with antibiotics.  But still very active in works in walks 10,000+ steps each day.  No symptoms.  No further complaints of palpitations or elevated heart rates.  ROS: Denies: Chest pain, shortness of breath, orthopnea, peripheral edema, palpitations, decreased exercise intolerance, fatigue, lightheadedness.   Studies Reviewed: .   Cardiac Studies & Procedures   ______________________________________________________________________________________________     ECHOCARDIOGRAM  ECHOCARDIOGRAM COMPLETE 06/20/2022  Narrative ECHOCARDIOGRAM REPORT    Patient Name:   Heather Villegas Date of Exam: 06/20/2022 Medical Rec #:  [id number]    Height:       66.0 in Accession  #:    [id number]   Weight:       189.0 lb Date of Birth:  05-04-1956     BSA:          1.952 m Patient Age:    66 years     BP:           138/62 mmHg Patient Gender: F            HR:           54 bpm. Exam Location:  [place]  Procedure: 2D Echo, Cardiac Doppler, Color Doppler and Strain Analysis  Indications:    Dyspnea on exertion [R06.09 (ICD-10-CM)]  History:        Patient has prior history of Echocardiogram examinations, most recent 01/08/2019. Signs/Symptoms:Murmur; Risk Factors:Dyslipidemia.  Sonographer:    Margreta Journey RDCS Referring Phys: [id number] ROBERT J KRASOWSKI  IMPRESSIONS   1. Left ventricular ejection fraction, by estimation, is 60 to 65%. The left ventricle has normal function. The left ventricle has no regional wall motion abnormalities. Left ventricular diastolic parameters are consistent with Grade I diastolic dysfunction (impaired relaxation). 2. Right ventricular systolic function is normal. The right ventricular size is normal. There is normal pulmonary artery systolic pressure. 3. The mitral valve is normal in structure. No evidence of mitral valve regurgitation. No evidence of mitral stenosis. 4. The aortic valve is normal in structure. Aortic valve regurgitation is not visualized. No aortic stenosis is present. 5. The inferior vena cava is normal in size with greater than 50% respiratory variability, suggesting right atrial pressure of 3 mmHg.  FINDINGS Left Ventricle: Left ventricular ejection fraction, by estimation, is 60 to  65%. The left ventricle has normal function. The left ventricle has no regional wall motion abnormalities. The left ventricular internal cavity size was normal in size. There is no left ventricular hypertrophy. Left ventricular diastolic parameters are consistent with Grade I diastolic dysfunction (impaired relaxation).  Right Ventricle: The right ventricular size is normal. No increase in right ventricular wall thickness. Right  ventricular systolic function is normal. There is normal pulmonary artery systolic pressure. The tricuspid regurgitant velocity is 2.51 m/s, and with an assumed right atrial pressure of 3 mmHg, the estimated right ventricular systolic pressure is 28.2 mmHg.  Left Atrium: Left atrial size was normal in size.  Right Atrium: Right atrial size was normal in size.  Pericardium: There is no evidence of pericardial effusion.  Mitral Valve: The mitral valve is normal in structure. No evidence of mitral valve regurgitation. No evidence of mitral valve stenosis.  Tricuspid Valve: The tricuspid valve is normal in structure. Tricuspid valve regurgitation is not demonstrated. No evidence of tricuspid stenosis.  Aortic Valve: The aortic valve is normal in structure. Aortic valve regurgitation is not visualized. No aortic stenosis is present.  Pulmonic Valve: The pulmonic valve was normal in structure. Pulmonic valve regurgitation is not visualized. No evidence of pulmonic stenosis.  Aorta: The aortic root is normal in size and structure.  Venous: The inferior vena cava is normal in size with greater than 50% respiratory variability, suggesting right atrial pressure of 3 mmHg.  IAS/Shunts: No atrial level shunt detected by color flow Doppler.   LEFT VENTRICLE PLAX 2D LVIDd:         4.00 cm   Diastology LVIDs:         2.60 cm   LV e' medial:    6.42 cm/s LV PW:         1.30 cm   LV E/e' medial:  12.7 LV IVS:        1.30 cm   LV e' lateral:   6.20 cm/s LVOT diam:     2.00 cm   LV E/e' lateral: 13.1 LV SV:         98 LV SV Index:   50 LVOT Area:     3.14 cm   RIGHT VENTRICLE             IVC RV Basal diam:  3.20 cm     IVC diam: 1.70 cm RV S prime:     10.40 cm/s TAPSE (M-mode): 2.2 cm  LEFT ATRIUM           Index        RIGHT ATRIUM           Index LA diam:      3.40 cm 1.74 cm/m   RA Area:     14.20 cm LA Vol (A2C): 47.7 ml 24.43 ml/m  RA Volume:   29.30 ml  15.01 ml/m LA Vol (A4C):  32.3 ml 16.54 ml/m AORTIC VALVE             PULMONIC VALVE LVOT Vmax:   119.00 cm/s PR End Diast Vel: 3.51 msec LVOT Vmean:  84.100 cm/s LVOT VTI:    0.313 m  AORTA Ao Root diam: 3.00 cm Ao Asc diam:  3.10 cm Ao Desc diam: 1.80 cm  MITRAL VALVE               TRICUSPID VALVE MV Area (PHT): 3.42 cm    TR Peak grad:   25.2 mmHg MV Decel Time: 222 msec  TR Vmax:        251.00 cm/s MR Peak grad: 67.9 mmHg MR Vmax:      412.00 cm/s  SHUNTS MV E velocity: 81.40 cm/s  Systemic VTI:  0.31 m MV A velocity: 91.20 cm/s  Systemic Diam: 2.00 cm MV E/A ratio:  0.89  Belva Crome MD Electronically signed by Belva Crome MD Signature Date/Time: 06/20/2022/12:33:49 PM    Final      CT SCANS  CT CARDIAC SCORING (SELF PAY ONLY) 06/28/2023  Addendum 07/07/2023  8:17 PM ADDENDUM REPORT: 07/07/2023 20:14  EXAM: OVER-READ INTERPRETATION CT CHEST  The following report is an over-read performed by radiologist Dr. Romona Curls of [facility] on 07/07/2023. This over-read does not include interpretation of cardiac or coronary anatomy or pathology. The coronary calcium score interpretation by the cardiologist is attached.  COMPARISON:  Chest radiograph dated 07/11/2021  FINDINGS: Cardiovascular: Vascular calcifications are seen in the thoracic aorta. Normal heart size. No pericardial effusion.  Mediastinum/Nodes: No enlarged mediastinal lymph nodes. The visible trachea and esophagus demonstrate no significant findings.  Lungs/Pleura: The visible lungs are clear. No pleural effusion.  Upper Abdomen: No acute abnormality.  Musculoskeletal: Degenerative changes are seen in the spine.  IMPRESSION: 1. No acute findings in the chest.  Aortic Atherosclerosis (ICD10-I70.0).   Electronically Signed By: Romona Curls M.D. On: 07/07/2023 20:14  Narrative : CLINICAL DATA:  Cardiovascular Disease Risk stratification  EXAM:  Coronary Calcium Score  TECHNIQUE:  A  gated, non-contrast computed tomography scan of the heart was  performed using 3mm slice thickness. Axial images were analyzed on a  dedicated workstation. Calcium scoring of the coronary arteries was  performed using the Agatston method.  FINDINGS:  Coronary Calcium Score:  Left main: 0  Left anterior descending artery: 0  Left circumflex artery: 0  Right coronary artery: 0  Total: 0  Percentile: <1  Pericardium: Normal.  Ascending Aorta: Normal caliber.  Pulmonary artery: Normal caliber  Non-cardiac: See separate report from [facility].  IMPRESSION:  Coronary calcium score of 0. This was <1 percentile for age-, race-,  and sex-matched controls.  RECOMMENDATIONS:  Coronary artery calcium (CAC) score is a strong predictor of  incident coronary heart disease (CHD) and provides predictive  information beyond traditional risk factors. CAC scoring is  reasonable to use in the decision to withhold, postpone, or initiate  statin therapy in intermediate-risk or selected borderline-risk  asymptomatic adults (age 75-75 years and LDL-C >=70 to <190 mg/dL)  who do not have diabetes or established atherosclerotic  cardiovascular disease (ASCVD).* In intermediate-risk (10-year ASCVD  risk >=7.5% to <20%) adults or selected borderline-risk (10-year  ASCVD risk >=5% to <7.5%) adults in whom a CAC score is measured for  the purpose of making a treatment decision the following  recommendations have been made:  If CAC=0, it is reasonable to withhold statin therapy and reassess  in 5 to 10 years, as long as higher risk conditions are absent  (diabetes mellitus, family history of premature CHD in first degree  relatives (males <55 years; females <65 years), cigarette smoking,  or LDL >=190 mg/dL).  If CAC is 1 to 99, it is reasonable to initiate statin therapy for  patients >=63 years of age.  If CAC is >=100 or >=75th percentile, it is reasonable  to initiate  statin therapy at any age.  Cardiology referral should be considered for patients with CAC  scores >=400 or >=75th percentile.  *2018 AHA/ACC/AACVPR/AAPA/ABC/ACPM/ADA/AGS/APhA/ASPC/NLA/PCNA  Guideline on the  Management of Blood Cholesterol: A Report of the  [employer] of Cardiology/American Heart Association Task Force  on Clinical Practice Guidelines. J Am Coll Cardiol.  2019;73(24):3168-3209.  Electronically Signed: By: Gypsy Balsam M.D. On: 07/01/2023 15:40     ______________________________________________________________________________________________       Risk Assessment/Calculations:     HYPERTENSION CONTROL Vitals:   08/27/23 0914 08/27/23 1004  BP: (!) 148/72 (!) 140/70    The patient's blood pressure is elevated above target today.  In order to address the patient's elevated BP: A current anti-hypertensive medication was adjusted today.          Physical Exam:   VS:  BP (!) 140/70   Pulse (!) 49   Ht 5\' 6"  (1.676 m)   Wt 194 lb (88 kg)   SpO2 94%   BMI 31.31 kg/m    Wt Readings from Last 3 Encounters:  08/27/23 194 lb (88 kg)  07/31/23 190 lb (86.2 kg)  06/21/23 192 lb (87.1 kg)    GEN: Well nourished, well developed in no acute distress NECK: No JVD; No carotid bruits CARDIAC: RRR, no murmurs, rubs, gallops RESPIRATORY:  Clear to auscultation without rales, wheezing or rhonchi  ABDOMEN: Soft, non-tender, non-distended EXTREMITIES:  No edema; No deformity   ASSESSMENT AND PLAN: .    Mitral regurgitation 05/2022 echocardiogram does not note any valvular disease and preserved biventricular function.  Murmur not appreciated on physical exam.   Given asymptomatic and no findings on echocardiogram we can continue to follow this as clinically indicated   Hypertension Today 140/70.  Which seems to be around her baseline at home. Increase amlodipine 5 mg to 10 mg daily.  We discussed possible side effects of lower  extremity edema.  We can adjust accordingly.  Will take blood pressure for the next 2 weeks twice a day.  Hyperlipidemia LDL 112 6 months ago. Repeat lipid panel today On atorvastatin 10 mg, will uptitrate if necessary.  Fatigue Likely related to acute illness with diverticulitis.  We will continue to monitor this, no obvious cardiac related issues.  Told her to reach back out if this did not improve in the upcoming weeks/months.  Bradycardia Also has first-degree AV block.  Asymptomatic with no symptoms of falls/syncope, dizziness.  Chronically reports this being low.  Just continue to monitor.    Dispo: 1 year with Dr. Bing Matter  Signed, Abagail Kitchens, PA-C

## 2023-08-28 ENCOUNTER — Encounter: Payer: Self-pay | Admitting: Physician Assistant

## 2023-08-28 MED ORDER — ATORVASTATIN CALCIUM 40 MG PO TABS: 40.0000 mg | ORAL_TABLET | Freq: Every day | ORAL | 3 refills | Status: AC

## 2023-08-29 ENCOUNTER — Encounter: Payer: Self-pay | Admitting: Cardiology

## 2023-09-26 ENCOUNTER — Encounter: Payer: Self-pay | Admitting: Obstetrics and Gynecology

## 2023-09-26 ENCOUNTER — Ambulatory Visit: Admitting: Obstetrics and Gynecology

## 2023-09-26 VITALS — BP 120/78 | HR 60 | Ht 64.25 in | Wt 195.0 lb

## 2023-09-26 DIAGNOSIS — R0683 Snoring: Secondary | ICD-10-CM

## 2023-09-26 DIAGNOSIS — E2839 Other primary ovarian failure: Secondary | ICD-10-CM

## 2023-09-26 DIAGNOSIS — N950 Postmenopausal bleeding: Secondary | ICD-10-CM | POA: Diagnosis not present

## 2023-09-26 NOTE — Progress Notes (Signed)
 09/27/2023 Heather Villegas [id number] 07-22-1955  Referring provider: Tonna Frederic,* Primary GI doctor: Dr. Karene Oto  ASSESSMENT AND PLAN:  Diverticulitis with some AB pain 07/31/2023 CT with diverticulitis treated cipro /flagyl  Last colonoscopy 2020 Has a lot of bloating, continue AB discomfort, has diarrhea/constipation - discussed low fiber diet due to SBO - IBGard give - since it has been 5 years and recent diverticulitis will schedule colonoscopy at [facility] with Dr. Karene Oto. We have discussed the risks of bleeding, infection, perforation, medication reactions, and remote risk of death associated with colonoscopy. All questions were answered and the patient acknowledges these risk and wishes to proceed. -  refer to dietician  - possible SIBO with history of small bowel resection due to adhesions  SBO 2023 likely due to adhesions 2 attempted hysterectomy, adhesions and bowel resection Saw general surgery in the hospital From adhesions - low fiber diet, given diet - miralax 1/2 capful, keep stools soft - discussed if she has a flare to do liquid diet - consider referral general surgery if any worsening symptoms  Elevated LFTs 2023 No recent elevations Negative hepatitis panel CT 2025 with normal liver, gallstones no inflammation Check CMET, possibly from medications  Screening colonoscopy 02/02/2019 colonoscopy Dr. Karene Oto screening purposes adequate bowel prep diverticula sigmoid colon nonbleeding internal hemorrhoids otherwise normal recall 10 years  Grade 1 diastolic heart failure 06/20/2022 EF 60 to 65% grade 1 diastolic unremarkable valves  Patient Care Team: Tonna Frederic, MD as PCP - General (Family Medicine) Manfred Seed, MD as PCP - Cardiology (Cardiology)  HISTORY OF PRESENT ILLNESS: 68 y.o. female with a past medical history listed below presents for evaluation of SBO, recent diverticulitis.   Discussed the use of AI scribe  software for clinical note transcription with the patient, who gave verbal consent to proceed.  History of Present Illness   Heather Villegas is a 68 year old female with a history of small bowel obstruction and diverticulitis who presents with concerns about abdominal symptoms and dietary management.  She has a history of small bowel obstruction, with the most recent episode in February 2023, requiring a four-day hospital stay. Treatment included a nasogastric tube, which successfully avoided surgery. She is concerned about her diet, particularly avoiding seeds and nuts, to prevent another obstruction. She experiences occasional abdominal pain, bloating, and alternating constipation and diarrhea. No recent vomiting, fever, chills, or weight loss.  She has a history of acute diverticulitis and is uncertain about dietary restrictions. She avoids seeds and nuts and is cautious about her diet to prevent further issues. Recently, she started eating cooked peppers and is trying to determine what foods are safe. Her last colonoscopy was five years ago, revealing diverticula and hemorrhoids.  She underwent a bowel resection during an attempted hysterectomy due to scar tissue, leading to complications including vomiting and pain. She describes a period of relief after a weekend of symptoms, avoiding hospitalization. She attributes occasional abdominal pain to scar tissue and describes a high tolerance for pain.  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 The second surgery placed the ureter on top of the bladder, which may contribute to her abdominal issues.  She has a history of gallstones but denies recent symptoms such as nausea, vomiting, or right upper quadrant pain.      She  reports that she quit smoking about 38 years ago. Her smoking use included cigarettes. She has never used smokeless tobacco. She  reports current alcohol use. She reports that she does  not use drugs.  RELEVANT GI HISTORY, IMAGING AND LABS: Results   LABS Liver function tests: Elevated (2023)  RADIOLOGY Abdominal CT: Normal liver, gallstones (2023)  DIAGNOSTIC Colonoscopy: Diverticula, hemorrhoids (01/2019) Echocardiogram: Normal cardiac function (05/2022)      CBC    Component Value Date/Time   WBC 6.7 08/05/2023 1539   RBC 4.99 08/05/2023 1539   HGB 14.9 08/05/2023 1539   HCT 43.8 08/05/2023 1539   PLT 288 08/05/2023 1539   MCV 87.8 08/05/2023 1539   MCH 29.9 08/05/2023 1539   MCHC 34.0 08/05/2023 1539   RDW 14.3 08/05/2023 1539   LYMPHSABS 1.2 07/12/2021 0520   MONOABS 0.5 07/12/2021 0520   EOSABS 0.1 07/12/2021 0520   BASOSABS 0.0 07/12/2021 0520   Recent Labs    01/31/23 0900 07/31/23 0112 08/05/23 1539  HGB 13.7 13.3 14.9    CMP     Component Value Date/Time   NA 137 08/05/2023 1539   K 3.8 08/05/2023 1539   CL 104 08/05/2023 1539   CO2 23 08/05/2023 1539   GLUCOSE 91 08/05/2023 1539   BUN 17 08/05/2023 1539   CREATININE 0.78 08/05/2023 1539   CALCIUM  9.6 08/05/2023 1539   PROT 7.0 07/31/2023 0112   ALBUMIN 3.9 07/31/2023 0112   AST 21 07/31/2023 0112   ALT 21 07/31/2023 0112   ALKPHOS 73 07/31/2023 0112   BILITOT 0.6 07/31/2023 0112   GFRNONAA >60 08/05/2023 1539      Latest Ref Rng & Units 07/31/2023    1:12 AM 01/31/2023    9:00 AM 07/24/2021   12:00 PM  Hepatic Function  Total Protein 6.5 - 8.1 g/dL 7.0  6.8  7.7   Albumin 3.5 - 5.0 g/dL 3.9  4.2  4.3   AST 15 - 41 U/L 21  23  19    ALT 0 - 44 U/L 21  20  29    Alk Phosphatase 38 - 126 U/L 73  81  115   Total Bilirubin 0.0 - 1.2 mg/dL 0.6  0.7  0.5   Bilirubin, Direct 0.0 - 0.3 mg/dL   0.1       Current Medications:    Current Outpatient Medications (Cardiovascular):    amLODipine  (NORVASC ) 10 MG tablet, Take 1 tablet (10 mg total) by mouth daily.   atorvastatin  (LIPITOR) 40 MG tablet, Take 1 tablet (40 mg total) by mouth daily.     Current Outpatient  Medications (Other):    Ascorbic Acid (VITAMIN C PO), Take 2 capsules by mouth daily. Take 2 capsules daily   b complex vitamins tablet, Take 1 tablet by mouth daily.   Coenzyme Q10 (CO Q 10 PO), Take 1 capsule by mouth daily.   Multiple Vitamin (MULTIVITAMIN) capsule, Take 1 capsule by mouth daily.   Na Sulfate-K Sulfate-Mg Sulfate concentrate (SUPREP) 17.5-3.13-1.6 GM/177ML SOLN, Take 1 kit (354 mLs total) by mouth once for 1 dose.   Omega-3 Fatty Acids (FISH OIL) 1000 MG CAPS, Take 1 capsule by mouth daily.   Turmeric (QC TUMERIC COMPLEX PO), Take by mouth.   VITAMIN D  PO, Take 2 tablets by mouth daily.  Medical History:  Past Medical History:  Diagnosis Date   Bowel obstruction (HCC)    Deafness in right ear    Diverticulitis    Heart murmur    High blood pressure    Hyperlipidemia    diet  controlled   Hypertension    Meniere disease  Obesity    Post-operative nausea and vomiting    Vertigo    To ED Friday night 9-10- was given Meclazine - improved    Allergies:  Allergies  Allergen Reactions   Tapentadol Rash    Surgical tape   Tape     Surgical/breaks down skin     Surgical History:  She  has a past surgical history that includes Tubal ligation; ureter reconnection; Ureteral reimplantion; bowel reconstruction (2003); Ablation; Cataract extraction; Kidney stone surgery (2018); Tonsillectomy; Abdominal hysterectomy; Breast biopsy (Left, 2013); and Appendectomy. Family History:  Her family history includes Drug abuse in her brother; Heart defect in her mother; Lung cancer in her father and mother; Thrombosis in her father.  REVIEW OF SYSTEMS  : All other systems reviewed and negative except where noted in the History of Present Illness.  PHYSICAL EXAM: BP (!) 140/62   Pulse (!) 54   Ht 5\' 4"  (1.626 m)   Wt 195 lb (88.5 kg)   BMI 33.47 kg/m  Physical Exam   GENERAL APPEARANCE: Well nourished, in no apparent distress. HEENT: No cervical lymphadenopathy,  unremarkable thyroid , sclerae anicteric, conjunctiva pink. RESPIRATORY: Respiratory effort normal, breath sounds clear to auscultation bilaterally without rales, rhonchi, or wheezing. CARDIO: Regular rate and rhythm with no murmurs, rubs, or gallops, peripheral pulses intact. ABDOMEN: Soft, non-distended, active bowel sounds in all four quadrants, right lower abdomen tender, no rebound tenderness, no mass appreciated. RECTAL: Declines. MUSCULOSKELETAL: Full range of motion, normal gait, without edema. SKIN: Dry, intact without rashes or lesions. No jaundice. NEURO: Alert, oriented, no focal deficits. PSYCH: Cooperative, normal mood and affect. EXTREMITIES: No swelling in legs.      Edmonia Gottron, PA-C 10:15 AM

## 2023-09-26 NOTE — Progress Notes (Signed)
 Acute Office Visit  Subjective:    Patient ID: Heather Villegas, female    DOB: [birth year], 68 y.o.   MRN: [id number]   HPI 68 y.o. presents today for NGYN (NGYN, pmb//jj)   PMB x1 day that soaked a pad and then stopped. She is sure it was vaginal She is seeing GI tomorrow for colitis and f/u. Last colonoscopy 2020 and reports it was normal Due for MMG in April Last pap smear 5 years ago.  Denies any abnormal pap smears Dxa: 2020 and normal. Mother with osteoporosis. Repeat ordered  Denies any history of gyn cancers in her family struggles with her weight gain and is watching diet. Snores at night: to get sleep study. Works graveyard shift  No LMP recorded. Patient is postmenopausal.   Past Medical History:  Diagnosis Date   Bowel obstruction (HCC)    Deafness in right ear    Heart murmur    Hyperlipidemia    diet  controlled   Hypertension    Meniere disease    Post-operative nausea and vomiting    Vertigo    To ED Friday night 9-10- was given Meclazine - improved    Social History   Socioeconomic History   Marital status: Married    Spouse name: Not on file   Number of children: 3   Years of education: 12   Highest education level: 12th grade  Occupational History   Occupation: Conservation officer, historic buildings  Tobacco Use   Smoking status: Former    Current packs/day: 0.00    Types: Cigarettes    Quit date: 1987    Years since quitting: 38.3   Smokeless tobacco: Never  Vaping Use   Vaping status: Never Used  Substance and Sexual Activity   Alcohol use: Yes    Comment: occ   Drug use: Never   Sexual activity: Not Currently    Partners: Male    Birth control/protection: Post-menopausal, Surgical    Comment: btl,1st intercourse- 20, partners- 4  Other Topics Concern   Not on file  Social History Narrative   Lives at home with her husband.   Right-handed.   1-2 cups caffeine  per day.   Social Drivers of Corporate investment banker Strain: Low Risk   (04/26/2023)   Overall Financial Resource Strain (CARDIA)    Difficulty of Paying Living Expenses: Not very hard  Food Insecurity: No Food Insecurity (04/26/2023)   Hunger Vital Sign    Worried About Running Out of Food in the Last Year: Never true    Ran Out of Food in the Last Year: Never true  Transportation Needs: No Transportation Needs (04/26/2023)   PRAPARE - Administrator, Civil Service (Medical): No    Lack of Transportation (Non-Medical): No  Physical Activity: Sufficiently Active (04/26/2023)   Exercise Vital Sign    Days of Exercise per Week: 7 days    Minutes of Exercise per Session: 30 min  Stress: Stress Concern Present (04/26/2023)   [employer] of Occupational Health - Occupational Stress Questionnaire    Feeling of Stress : To some extent  Social Connections: Moderately Integrated (04/26/2023)   Social Connection and Isolation Panel [NHANES]    Frequency of Communication with Friends and Family: Once a week    Frequency of Social Gatherings with Friends and Family: Once a week    Attends Religious Services: More than 4 times per year    Active Member of [employer] or Organizations: Yes  Attends Banker Meetings: More than 4 times per year    Marital Status: Married   Past Surgical History:  Procedure Laterality Date   ABDOMINAL HYSTERECTOMY     2 attempts/ unable to remove uterus/ had a lot bleeding until ablation was done   ABLATION     APPENDECTOMY     bowel reconstruction  2003   had adhesions/ removed part of small intestine   BREAST BIOPSY Left 2013   left/ precancerous lesion   CATARACT EXTRACTION     left eye   KIDNEY STONE SURGERY  2018   lithotripsy   TONSILLECTOMY     TUBAL LIGATION     ureter reconnection     URETERAL REIMPLANTION     Current Outpatient Medications on File Prior to Visit  Medication Sig Dispense Refill   amLODipine  (NORVASC ) 10 MG tablet Take 1 tablet (10 mg total) by mouth daily. 30 tablet 11    Ascorbic Acid (VITAMIN C PO) Take 2 capsules by mouth daily. Take 2 capsules daily     atorvastatin  (LIPITOR) 40 MG tablet Take 1 tablet (40 mg total) by mouth daily. 90 tablet 3   b complex vitamins tablet Take 1 tablet by mouth daily.     Coenzyme Q10 (CO Q 10 PO) Take 1 capsule by mouth daily.     Multiple Vitamin (MULTIVITAMIN) capsule Take 1 capsule by mouth daily.     Omega-3 Fatty Acids (FISH OIL) 1000 MG CAPS Take 1 capsule by mouth daily.     Turmeric (QC TUMERIC COMPLEX PO) Take by mouth.     VITAMIN D  PO Take 2 tablets by mouth daily.     No current facility-administered medications on file prior to visit.    Review of Systems     Objective:     OBGyn Exam  BP 120/78   Pulse 60   Ht 5' 4.25" (1.632 m)   Wt 195 lb (88.5 kg)   SpO2 98%   BMI 33.21 kg/m  Wt Readings from Last 3 Encounters:  09/26/23 195 lb (88.5 kg)  08/27/23 194 lb (88 kg)  07/31/23 190 lb (86.2 kg)        Patient informed chaperone available to be present for breast and/or pelvic exam. Patient has requested no chaperone to be present. Patient has been advised what will be completed during breast and pelvic exam.   Assessment & Plan:  PMB To return for EMB, pap smear and PUS.  Counseled on the importance for endometrial cancer and or evaluation of precancerous cells.  She voiced understanding DXA ordered Encouraged annual mammogram screening  30 minutes spent on reviewing records, imaging,  and one on one patient time and counseling patient and documentation Dr. Caro Christmas

## 2023-09-27 ENCOUNTER — Other Ambulatory Visit (INDEPENDENT_AMBULATORY_CARE_PROVIDER_SITE_OTHER)

## 2023-09-27 ENCOUNTER — Ambulatory Visit: Admitting: Physician Assistant

## 2023-09-27 ENCOUNTER — Encounter: Payer: Self-pay | Admitting: Physician Assistant

## 2023-09-27 VITALS — BP 140/62 | HR 54 | Ht 64.0 in | Wt 195.0 lb

## 2023-09-27 DIAGNOSIS — K660 Peritoneal adhesions (postprocedural) (postinfection): Secondary | ICD-10-CM | POA: Diagnosis not present

## 2023-09-27 DIAGNOSIS — K5732 Diverticulitis of large intestine without perforation or abscess without bleeding: Secondary | ICD-10-CM | POA: Diagnosis not present

## 2023-09-27 DIAGNOSIS — R7989 Other specified abnormal findings of blood chemistry: Secondary | ICD-10-CM

## 2023-09-27 DIAGNOSIS — K56609 Unspecified intestinal obstruction, unspecified as to partial versus complete obstruction: Secondary | ICD-10-CM

## 2023-09-27 DIAGNOSIS — R109 Unspecified abdominal pain: Secondary | ICD-10-CM

## 2023-09-27 DIAGNOSIS — I5032 Chronic diastolic (congestive) heart failure: Secondary | ICD-10-CM

## 2023-09-27 LAB — COMPREHENSIVE METABOLIC PANEL WITH GFR
ALT: 23 U/L (ref 0–35)
AST: 23 U/L (ref 0–37)
Albumin: 4.6 g/dL (ref 3.5–5.2)
Alkaline Phosphatase: 81 U/L (ref 39–117)
BUN: 17 mg/dL (ref 6–23)
CO2: 31 meq/L (ref 19–32)
Calcium: 9.9 mg/dL (ref 8.4–10.5)
Chloride: 103 meq/L (ref 96–112)
Creatinine, Ser: 0.71 mg/dL (ref 0.40–1.20)
GFR: 88.85 mL/min (ref >60.00)
Glucose, Bld: 90 mg/dL (ref 70–99)
Potassium: 4.6 meq/L (ref 3.5–5.1)
Sodium: 140 meq/L (ref 135–145)
Total Bilirubin: 0.7 mg/dL (ref 0.2–1.2)
Total Protein: 7.6 g/dL (ref 6.0–8.3)

## 2023-09-27 LAB — CBC WITH DIFFERENTIAL/PLATELET
Basophils Absolute: 0 10*3/uL (ref 0.0–0.1)
Basophils Relative: 0.6 % (ref 0.0–3.0)
Eosinophils Absolute: 0.1 10*3/uL (ref 0.0–0.7)
Eosinophils Relative: 2 % (ref 0.0–5.0)
HCT: 42.4 % (ref 36.0–46.0)
Hemoglobin: 14.2 g/dL (ref 12.0–15.0)
Lymphocytes Relative: 30.2 % (ref 12.0–46.0)
Lymphs Abs: 1.4 10*3/uL (ref 0.7–4.0)
MCHC: 33.5 g/dL (ref 30.0–36.0)
MCV: 90.6 fl (ref 78.0–100.0)
Monocytes Absolute: 0.4 10*3/uL (ref 0.1–1.0)
Monocytes Relative: 8.7 % (ref 3.0–12.0)
Neutro Abs: 2.6 10*3/uL (ref 1.4–7.7)
Neutrophils Relative %: 58.5 % (ref 43.0–77.0)
Platelets: 270 10*3/uL (ref 150.0–400.0)
RBC: 4.68 Mil/uL (ref 3.87–5.11)
RDW: 14.4 % (ref 11.5–15.5)
WBC: 4.5 10*3/uL (ref 4.0–10.5)

## 2023-09-27 LAB — SEDIMENTATION RATE: Sed Rate: 16 mm/h (ref 0–30)

## 2023-09-27 MED ORDER — NA SULFATE-K SULFATE-MG SULF 17.5-3.13-1.6 GM/177ML PO SOLN: 1.0000 | Freq: Once | ORAL | 0 refills | Status: AC

## 2023-09-27 NOTE — Patient Instructions (Addendum)
 Miralax is an osmotic laxative.  It only brings more water into the stool.  This is safe to take daily.  Can take up to 17 gram of miralax twice a day.  Mix with juice or coffee.  Start 1 capful at night for 3-4 days and reassess your response in 3-4 days.  You can increase and decrease the dose based on your response.  Remember, it can take up to 3-4 days to take effect OR for the effects to wear off.   For IBS and peppermint oil.  Peppermint oil has been proven to be better than placebo for cramping for IBS Stop if it worsens heart burn or causes flushing of your face.  Ideally enteric coated peppermint oil capsules 2 a day is best but if you got the oil, you can use 0.73ml or 180 mg of pepperment oil up to 3 x a day.    Diverticulosis Diverticulosis is a condition that develops when small pouches (diverticula) form in the wall of the large intestine (colon). The colon is where water is absorbed and stool (feces) is formed. The pouches form when the inside layer of the colon pushes through weak spots in the outer layers of the colon. You may have a few pouches or many of them. The pouches usually do not cause problems unless they become inflamed or infected. When this happens, the condition is called diverticulitis- this is left lower quadrant pain, diarrhea, fever, chills, nausea or vomiting.  If this occurs please call the office or go to the hospital. Sometimes these patches without inflammation can also have painless bleeding associated with them, if this happens please call the office or go to the hospital. Preventing constipation can help reduce diverticula and prevent complications.  Low-Fiber Eating Plan Fiber is found in fruits, vegetables, whole grains, and beans. Eating a diet low in fiber helps you poop less often. A low-fiber eating plan may help your digestive system heal if: You have certain conditions, such as Crohn's disease, diverticulitis, or irritable bowel syndrome  (IBS), and are having a flare-up. You have had radiation therapy on your pelvis or bowel. You have had surgery on your intestines. You have a new surgical opening in your abdomen called a colostomy or ileostomy. You have an intestine that has narrowed. Your health care provider will tell you how long to stay on this diet. You may find it helpful to work with a dietitian to plan your meals. What are tips for following this plan? Reading food labels  Check the nutrition facts label on food products for the amount of dietary fiber. Choose foods that have less than 2 grams (g) of fiber per serving. General information Eat 5-6 small meals throughout the day instead of 3 large meals. Follow instructions from your provider about how much fiber you should have each day and for how long you should follow a low fiber diet. Most people on a low-fiber eating plan should eat less than 10 g of fiber a day. Your daily fiber goal is _________________ g. What foods should I eat? Fruits Soft-cooked or canned fruits without skin and seeds. Ripe banana. Applesauce. Fruit juice without pulp. Vegetables Well-cooked or canned vegetables without skin, seeds, or stems. Cooked potatoes without skins. Vegetable juice. Grains All bread and crackers made with white flour. Waffles, pancakes, and [place] toast. Bagels. Pretzels. Melba toast, zwieback, and matzoh. Cooked and dried cereals that do not have whole grains, added fiber, seeds, or dried fruit. Tilmon Font. Hot and  cold cereals made with refined corn, rice, or oats. Plain pasta and noodles. White rice. Meats and other proteins Ground meat. Tender cuts of meat or poultry. Eggs. Fish, seafood, and shellfish. Smooth nut butters. Tofu. Dairy All milk products and drinks. Lactose-free milk, including rice, soy, and almond milk. Yogurt without fruit, nuts, chocolate, or granola mixed in. Sour cream. Cottage cheese. Cheese. Fats and oils Olive oil, canola oil, sunflower  oil, flaxseed oil, avocado oil, and grapeseed oil. Mayonnaise. Cream cheese. Margarine. Butter. Beverages Decaf coffee. Fruit and vegetable juices. Smoothies in small amounts, with no pulp or skins, and with fruits from the recommended list. Sports drinks. Herbal tea. Water. Sweets and desserts Plain cakes. Cookies. Cream pies and pies made with recommended fruits. Pudding. Custard. Fruit gelatin. Sherbet. Ice pops. Ice cream without nuts. Hard candy. Honey. Jelly. Molasses. Syrups. Chocolate. Marshmallows. Gumdrops. Seasonings and condiments Ketchup. Mild mustard. Mild salad dressings. Plain gravies. Vinegar. Spices in moderation. Salt. Sugar. Other foods Bouillon. Broth. Cream and strained soups made from recommended foods. Casseroles made with recommended foods. The items listed above may not be all the foods and drinks you can have. Talk to a dietitian to learn more. What foods should I avoid? Fruits Raw or dried fruit. Berries. Fruit juice with pulp. Prune juice. Vegetables Potato skins. Raw or undercooked vegetables. All beans and bean sprouts. Cooked greens. Corn. Peas. Cabbage. Beets. Broccoli. Brussels sprouts. Cauliflower. Mushrooms. Onions. Peppers. Parsnips. Okra. Sauerkraut. Grains Whole-wheat, whole-grain, or multigrain breads, cereals, or crackers. Rye bread. Cereals with nuts, raisins, or coconut. Bran. Granola. High-fiber cereals. Cornmeal or corn bread. Whole-grain pasta. Wild or brown rice. Quinoa. Popcorn. Buckwheat. Wheat germ. Meats and other proteins Tough, fibrous meats with gristle. Fatty meat. Poultry with skin. Fried meat, Environmental education officer, or fish. Precooked or cured meat, such as sausages or meat loaves. Helene Loader. Hot dogs. Nuts and chunky nut butter. Dried peas, beans, and lentils. Hummus. Dairy Yogurt with fruit, nuts, chocolate, or granola mixed in. Full-fat dairy such as whole milk, ice cream, or sour cream. Beverages Caffeinated coffee and teas. Fats and oils Avocado.  Coconut. Butter. Sweets and desserts Desserts, cookies, or candies that contain nuts or coconut. Dried fruit. Jams and preserves with seeds. Marmalade. Any dessert made with fruits or grains that are not recommended. Seasonings and condiments Relish. Horseradish. Vanessa General. Olives. Other foods Corn tortilla chips. Soups made with vegetables or grains that are not recommended. The items listed above may not be all the foods and drinks you should avoid. Talk to a dietitian to learn more. This information is not intended to replace advice given to you by your health care provider. Make sure you discuss any questions you have with your health care provider. Document Revised: 07/30/2022 Document Reviewed: 07/30/2022 Elsevier Patient Education  2024 Elsevier Inc.  Small intestinal bacterial overgrowth (SIBO) occurs when there is an abnormal increase in the overall bacterial population in the small intestine -- particularly types of bacteria not commonly found in that part of the digestive tract. Small intestinal bacterial overgrowth (SIBO) commonly results when a circumstance -- such as surgery or disease -- slows the passage of food and waste products in the digestive tract, creating a breeding ground for bacteria.  Signs and symptoms of SIBO often include: Loss of appetite Abdominal pain Nausea Bloating An uncomfortable feeling of fullness after eating Diarrhea or constipation, depending on the type of gas produced  What foods trigger SIBO? While foods aren't the original cause of SIBO, certain foods do encourage the  overgrowth of the wrong bacteria in your small intestine. If you're feeding them their favorite foods, they're going to grow more, and that will trigger more of your SIBO symptoms. By the same token, you can help reduce the overgrowth by starving the problematic bacteria of their favorite foods. This strategy has led to a number of proposed SIBO eating plans. The plans vary, and so  do individual results. But in general, they tend to recommend limiting carbohydrates.  These include: Sugars and sweeteners. Fruits and starchy vegetables. Dairy products. Grains.  There is a test for this we can do called a breath test, if you are positive we will treat you with an antibiotic to see if it helps.  Your symptoms are very suspicious for this condition, as discussed, we will start you on an antibiotic to see if this helps.    You have been scheduled for a colonoscopy. Please follow written instructions given to you at your visit today.   If you use inhalers (even only as needed), please bring them with you on the day of your procedure.  DO NOT TAKE 7 DAYS PRIOR TO TEST- Trulicity (dulaglutide) Ozempic, Wegovy (semaglutide) Mounjaro (tirzepatide) Bydureon Bcise (exanatide extended release)  DO NOT TAKE 1 DAY PRIOR TO YOUR TEST Rybelsus (semaglutide) Adlyxin (lixisenatide) Victoza (liraglutide) Byetta (exanatide) ___________________________________________________________________________  Due to recent changes in healthcare laws, you may see the results of your imaging and laboratory studies on MyChart before your provider has had a chance to review them.  We understand that in some cases there may be results that are confusing or concerning to you. Not all laboratory results come back in the same time frame and the provider may be waiting for multiple results in order to interpret others.  Please give us  48 hours in order for your provider to thoroughly review all the results before contacting the office for clarification of your results.

## 2023-09-27 NOTE — Addendum Note (Signed)
 Addended by: Reinaldo Caras on: 09/27/2023 02:17 PM   Modules accepted: Level of Service

## 2023-10-01 ENCOUNTER — Other Ambulatory Visit: Payer: Self-pay | Admitting: Obstetrics and Gynecology

## 2023-10-01 DIAGNOSIS — Z1231 Encounter for screening mammogram for malignant neoplasm of breast: Secondary | ICD-10-CM

## 2023-10-01 NOTE — Progress Notes (Signed)
 Agree with the assessment and plan as outlined by Quentin Mulling, PA-C. ? ?Keron Neenan, DO, FACG ? ?

## 2023-10-09 ENCOUNTER — Encounter: Payer: Self-pay | Admitting: Cardiology

## 2023-10-11 ENCOUNTER — Telehealth: Payer: Self-pay

## 2023-10-11 DIAGNOSIS — E785 Hyperlipidemia, unspecified: Secondary | ICD-10-CM

## 2023-10-11 NOTE — Telephone Encounter (Signed)
 Lipid panel added per last appt with PA-C

## 2023-10-12 LAB — LIPID PANEL
Chol/HDL Ratio: 2.7 ratio (ref 0.0–4.4)
Cholesterol, Total: 172 mg/dL (ref 100–199)
HDL: 63 mg/dL (ref >39)
LDL Chol Calc (NIH): 95 mg/dL (ref 0–99)
Triglycerides: 77 mg/dL (ref 0–149)
VLDL Cholesterol Cal: 14 mg/dL (ref 5–40)

## 2023-10-17 ENCOUNTER — Ambulatory Visit
Admission: RE | Admit: 2023-10-17 | Discharge: 2023-10-17 | Disposition: A | Discharge status: Home / Self Care | Source: Ambulatory Visit | Attending: Obstetrics and Gynecology

## 2023-10-17 ENCOUNTER — Other Ambulatory Visit (HOSPITAL_COMMUNITY)
Admission: RE | Admit: 2023-10-17 | Discharge: 2023-10-17 | Disposition: A | Discharge status: Home / Self Care | Source: Ambulatory Visit | Attending: Obstetrics and Gynecology | Admitting: Obstetrics and Gynecology

## 2023-10-17 ENCOUNTER — Ambulatory Visit (HOSPITAL_BASED_OUTPATIENT_CLINIC_OR_DEPARTMENT_OTHER)
Admission: RE | Admit: 2023-10-17 | Discharge: 2023-10-17 | Disposition: A | Discharge status: Home / Self Care | Source: Ambulatory Visit | Attending: Obstetrics and Gynecology | Admitting: Obstetrics and Gynecology

## 2023-10-17 ENCOUNTER — Encounter: Payer: Self-pay | Admitting: Obstetrics and Gynecology

## 2023-10-17 ENCOUNTER — Ambulatory Visit (INDEPENDENT_AMBULATORY_CARE_PROVIDER_SITE_OTHER): Admitting: Obstetrics and Gynecology

## 2023-10-17 ENCOUNTER — Ambulatory Visit

## 2023-10-17 VITALS — BP 124/68 | HR 59

## 2023-10-17 DIAGNOSIS — N950 Postmenopausal bleeding: Secondary | ICD-10-CM

## 2023-10-17 DIAGNOSIS — Z9889 Other specified postprocedural states: Secondary | ICD-10-CM | POA: Diagnosis present

## 2023-10-17 DIAGNOSIS — E2839 Other primary ovarian failure: Secondary | ICD-10-CM | POA: Diagnosis present

## 2023-10-17 DIAGNOSIS — Z1231 Encounter for screening mammogram for malignant neoplasm of breast: Secondary | ICD-10-CM

## 2023-10-17 NOTE — Progress Notes (Signed)
   Acute Office Visit  Subjective:    Patient ID: Heather Villegas, female    DOB: 1955/09/28, 68 y.o.   MRN: [id number]   HPI 68 y.o. presents today for u/s & endo bx (U/s & endo bx//jj) .    PMB x1 day that soaked a pad and then stopped. She is sure it was vaginal She is seeing GI tomorrow for colitis and f/u. Last colonoscopy 2020 and reports it was normal.  July she has colonoscopy scheduled.  Has had a bowel resection. She is not a surgical candidate for hysterectomy.  Has 2 prior failed attempts.  Has ablation Due for MMG in April Last pap smear 5 years ago.  Denies any abnormal pap smears Dxa: 2020 and normal. Mother with osteoporosis. Repeat ordered  Denies any history of gyn cancers in her family struggles with her weight gain and is watching diet. Snores at night: to get sleep study. 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  Discussed bone support medication options if needed. No LMP recorded. Patient is postmenopausal.    Review of Systems     Objective:     OBGyn Exam  BP 124/68   Pulse (!) 59   SpO2 97%  Wt Readings from Last 3 Encounters:  09/27/23 195 lb (88.5 kg)  09/26/23 195 lb (88.5 kg)  08/27/23 194 lb (88 kg)        PROCEDURE: EMB Consent obtained for the procedure.  Time out performed. A bivalve speculum was placed in the vagina.  The cervix was grasped with a single tooth tenaculum.  Pipelle was inserted and rotated. Uterus sound to 7cm. scant specimen was obtained and sent to pathology.  All instruments were removed.  Patient tolerated the procedure fairly well.  To notify patient of the results.  Joy, CMA was present for the entire procedure  Assessment & Plan:  PMB, history of ablation, not a surgical candidate for hysterectomy, normal endometrial lining today on PUS to discuss PUS results as well, fibroids Lining reassuring today. Two small fibroids seen and are small. Both ovaries are normal. With normal lining, patient  with low  likelihood of endometrial cancer. Will check cells on pathology but scant specimen obtained. Continue close care with GI to rule out source of bleeding as well.  Patient agreed. RTC with any concerns and with annual exams  Dr. Caro Christmas

## 2023-10-21 ENCOUNTER — Ambulatory Visit: Payer: Self-pay | Admitting: Obstetrics and Gynecology

## 2023-10-21 LAB — SURGICAL PATHOLOGY

## 2023-11-14 ENCOUNTER — Encounter: Payer: Self-pay | Admitting: Gastroenterology

## 2023-11-25 ENCOUNTER — Encounter: Payer: Self-pay | Admitting: Gastroenterology

## 2023-11-25 ENCOUNTER — Ambulatory Visit: Admitting: Gastroenterology

## 2023-11-25 VITALS — BP 146/62 | HR 58 | Temp 98.4°F | Resp 11 | Ht 64.0 in | Wt 195.0 lb

## 2023-11-25 DIAGNOSIS — D124 Benign neoplasm of descending colon: Secondary | ICD-10-CM

## 2023-11-25 DIAGNOSIS — K56609 Unspecified intestinal obstruction, unspecified as to partial versus complete obstruction: Secondary | ICD-10-CM

## 2023-11-25 DIAGNOSIS — K562 Volvulus: Secondary | ICD-10-CM

## 2023-11-25 DIAGNOSIS — K5730 Diverticulosis of large intestine without perforation or abscess without bleeding: Secondary | ICD-10-CM

## 2023-11-25 DIAGNOSIS — K5732 Diverticulitis of large intestine without perforation or abscess without bleeding: Secondary | ICD-10-CM

## 2023-11-25 DIAGNOSIS — D120 Benign neoplasm of cecum: Secondary | ICD-10-CM

## 2023-11-25 DIAGNOSIS — K6389 Other specified diseases of intestine: Secondary | ICD-10-CM | POA: Diagnosis not present

## 2023-11-25 DIAGNOSIS — K635 Polyp of colon: Secondary | ICD-10-CM | POA: Diagnosis not present

## 2023-11-25 MED ORDER — SODIUM CHLORIDE 0.9 % IV SOLN: 500.0000 mL | Freq: Once | INTRAVENOUS | Status: DC

## 2023-11-25 NOTE — Progress Notes (Signed)
 GASTROENTEROLOGY PROCEDURE H&P NOTE   Primary Care Physician: Berneta Elsie Sayre, MD    Reason for Procedure:   Diverticulitis, bloating, change in bowel habits  Plan:    Colonoscopy  Patient is appropriate for endoscopic procedure(s) in the ambulatory ([facility]) setting.  The nature of the procedure, as well as the risks, benefits, and alternatives were carefully and thoroughly reviewed with the patient. Ample time for discussion and questions allowed. The patient understood, was satisfied, and agreed to proceed.     HPI: Heather Villegas is a 68 y.o. female who presents for colonoscopy for evaluation of prior diverticulitis in 07/2023. No significant changes in clinical hx since last OV on 5//01/2024.   02/02/2019 colonoscopy Dr. San screening purposes adequate bowel prep diverticula sigmoid colon nonbleeding internal hemorrhoids otherwise normal   Past Medical History:  Diagnosis Date   Bowel obstruction (HCC)    Deafness in right ear    Diverticulitis    Heart murmur    High blood pressure    Hyperlipidemia    diet  controlled   Hypertension    Meniere disease    Obesity    Post-operative nausea and vomiting    Vertigo    To ED Friday night 9-10- was given Meclazine - improved     Past Surgical History:  Procedure Laterality Date   ABDOMINAL HYSTERECTOMY     2 attempts/ unable to remove uterus/ had a lot bleeding until ablation was done   ABLATION     APPENDECTOMY     bowel reconstruction  2003   had adhesions/ removed part of small intestine   BREAST BIOPSY Left 2013   left/ precancerous lesion   CATARACT EXTRACTION     left eye   KIDNEY STONE SURGERY  2018   lithotripsy   TONSILLECTOMY     TUBAL LIGATION     ureter reconnection     URETERAL REIMPLANTION      Prior to Admission medications   Medication Sig Start Date End Date Taking? Authorizing Provider  amLODipine  (NORVASC ) 10 MG tablet Take 1 tablet (10 mg total) by mouth daily. 08/27/23  Yes  Darryle Thom LITTIE, PA-C  Ascorbic Acid (VITAMIN C PO) Take 2 capsules by mouth daily. Take 2 capsules daily   Yes [provider]  atorvastatin  (LIPITOR) 40 MG tablet Take 1 tablet (40 mg total) by mouth daily. 08/28/23 11/26/23 Yes Darryle Thom L, PA-C  b complex vitamins tablet Take 1 tablet by mouth daily.    [provider]  Coenzyme Q10 (CO Q 10 PO) Take 1 capsule by mouth daily.    [provider]  Multiple Vitamin (MULTIVITAMIN) capsule Take 1 capsule by mouth daily.    [provider]  Omega-3 Fatty Acids (FISH OIL) 1000 MG CAPS Take 1 capsule by mouth daily.    [provider]  Turmeric (QC TUMERIC COMPLEX PO) Take by mouth.    [provider]  VITAMIN D  PO Take 2 tablets by mouth daily.    [provider]    Current Outpatient Medications  Medication Sig Dispense Refill   amLODipine  (NORVASC ) 10 MG tablet Take 1 tablet (10 mg total) by mouth daily. 30 tablet 11   Ascorbic Acid (VITAMIN C PO) Take 2 capsules by mouth daily. Take 2 capsules daily     atorvastatin  (LIPITOR) 40 MG tablet Take 1 tablet (40 mg total) by mouth daily. 90 tablet 3   b complex vitamins tablet Take 1 tablet by mouth  daily.     Coenzyme Q10 (CO Q 10 PO) Take 1 capsule by mouth daily.     Multiple Vitamin (MULTIVITAMIN) capsule Take 1 capsule by mouth daily.     Omega-3 Fatty Acids (FISH OIL) 1000 MG CAPS Take 1 capsule by mouth daily.     Turmeric (QC TUMERIC COMPLEX PO) Take by mouth.     VITAMIN D  PO Take 2 tablets by mouth daily.     Current Facility-Administered Medications  Medication Dose Route Frequency Provider Last Rate Last Admin   0.9 %  sodium chloride  infusion  500 mL Intravenous Once Rawson Minix V, DO        Allergies as of 11/25/2023 - Review Complete 11/25/2023  Allergen Reaction Noted   Tapentadol Rash 03/03/2019   Tape  12/04/2018    Family History  Problem Relation Age of Onset   Lung cancer Mother    Heart defect  Mother    Lung cancer Father    Thrombosis Father    Drug abuse Brother    Colon cancer Neg Hx    Esophageal cancer Neg Hx    Rectal cancer Neg Hx    Stomach cancer Neg Hx     Social History   Socioeconomic History   Marital status: Married    Spouse name: Not on file   Number of children: 3   Years of education: 12   Highest education level: 12th grade  Occupational History   Occupation: Conservation officer, historic buildings  Tobacco Use   Smoking status: Former    Current packs/day: 0.00    Types: Cigarettes    Quit date: 1987    Years since quitting: 38.5   Smokeless tobacco: Never   Tobacco comments:    Only smoked for a year and a half  Vaping Use   Vaping status: Never Used  Substance and Sexual Activity   Alcohol use: Yes    Comment: occ   Drug use: Never   Sexual activity: Not Currently    Partners: Male    Birth control/protection: Post-menopausal, Surgical    Comment: btl,1st intercourse- 20, partners- 4  Other Topics Concern   Not on file  Social History Narrative   Lives at home with her husband.   Right-handed.   1-2 cups caffeine  per day.   Social Drivers of Corporate investment banker Strain: Low Risk  (04/26/2023)   Overall Financial Resource Strain (CARDIA)    Difficulty of Paying Living Expenses: Not very hard  Food Insecurity: No Food Insecurity (04/26/2023)   Hunger Vital Sign    Worried About Running Out of Food in the Last Year: Never true    Ran Out of Food in the Last Year: Never true  Transportation Needs: No Transportation Needs (04/26/2023)   PRAPARE - Administrator, Civil Service (Medical): No    Lack of Transportation (Non-Medical): No  Physical Activity: Sufficiently Active (04/26/2023)   Exercise Vital Sign    Days of Exercise per Week: 7 days    Minutes of Exercise per Session: 30 min  Stress: Stress Concern Present (04/26/2023)   [employer] of Occupational Health - Occupational Stress Questionnaire    Feeling of Stress  : To some extent  Social Connections: Moderately Integrated (04/26/2023)   Social Connection and Isolation Panel    Frequency of Communication with Friends and Family: Once a week    Frequency of Social Gatherings with Friends and Family: Once a week    Attends Religious  Services: More than 4 times per year    Active Member of Clubs or Organizations: Yes    Attends Banker Meetings: More than 4 times per year    Marital Status: Married  Intimate Partner Violence: Unknown (02/04/2023)   Received from [facility]   HITS    Physically Hurt: Not on file    Insult or Talk Down To: Not on file    Threaten Physical Harm: Not on file    Scream or Curse: Not on file    Physical Exam: Vital signs in last 24 hours: @BP  (!) 164/70   Pulse (!) 59   Temp 98.4 F (36.9 C) (Temporal)   Resp 13   Ht 5' 4 (1.626 m)   Wt 195 lb (88.5 kg)   SpO2 97%   BMI 33.47 kg/m  GEN: NAD EYE: Sclerae anicteric ENT: MMM CV: Non-tachycardic Pulm: CTA b/l GI: Soft, NT/ND NEURO:  Alert & Oriented x 3   Sandor Flatter, DO [facility]   11/25/2023 10:03 AM

## 2023-11-25 NOTE — Progress Notes (Signed)
 Called to room to assist during endoscopic procedure.  Patient ID and intended procedure confirmed with present staff. Received instructions for my participation in the procedure from the performing physician.

## 2023-11-25 NOTE — Progress Notes (Signed)
I have reviewed the patient's medical history in detail and updated the computerized patient record.

## 2023-11-25 NOTE — Patient Instructions (Signed)
 Thank you for letting us  care for your healthcare needs today! Please see handouts regarding Polyps and Diverticulosis. Resume previous diet. Continue present medications. Await pathology results. Return to GI Office as needed.  YOU HAD AN ENDOSCOPIC PROCEDURE TODAY AT THE [facility]:   Refer to the procedure report that was given to you for any specific questions about what was found during the examination.  If the procedure report does not answer your questions, please call your gastroenterologist to clarify.  If you requested that your care partner not be given the details of your procedure findings, then the procedure report has been included in a sealed envelope for you to review at your convenience later.  YOU SHOULD EXPECT: Some feelings of bloating in the abdomen. Passage of more gas than usual.  Walking can help get rid of the air that was put into your GI tract during the procedure and reduce the bloating. If you had a lower endoscopy (such as a colonoscopy or flexible sigmoidoscopy) you may notice spotting of blood in your stool or on the toilet paper. If you underwent a bowel prep for your procedure, you may not have a normal bowel movement for a few days.  Please Note:  You might notice some irritation and congestion in your nose or some drainage.  This is from the oxygen used during your procedure.  There is no need for concern and it should clear up in a day or so.  SYMPTOMS TO REPORT IMMEDIATELY:  Following lower endoscopy (colonoscopy or flexible sigmoidoscopy):  Excessive amounts of blood in the stool  Significant tenderness or worsening of abdominal pains  Swelling of the abdomen that is new, acute  Fever of 100F or higher  For urgent or emergent issues, a gastroenterologist can be reached at any hour by calling [phone]. Do not use MyChart messaging for urgent concerns.    DIET:  We do recommend a small meal at first, but then you may proceed to your  regular diet.  Drink plenty of fluids but you should avoid alcoholic beverages for 24 hours.  ACTIVITY:  You should plan to take it easy for the rest of today and you should NOT DRIVE or use heavy machinery until tomorrow (because of the sedation medicines used during the test).    FOLLOW UP: Our staff will call the number listed on your records the next business day following your procedure.  We will call around 7:15- 8:00 am to check on you and address any questions or concerns that you may have regarding the information given to you following your procedure. If we do not reach you, we will leave a message.     If any biopsies were taken you will be contacted by phone or by letter within the next 1-3 weeks.  Please call us  at [phone] if you have not heard about the biopsies in 3 weeks.    SIGNATURES/CONFIDENTIALITY: You and/or your care partner have signed paperwork which will be entered into your electronic medical record.  These signatures attest to the fact that that the information above on your After Visit Summary has been reviewed and is understood.  Full responsibility of the confidentiality of this discharge information lies with you and/or your care-partner.

## 2023-11-25 NOTE — Progress Notes (Signed)
 Vss nad trans to pacu

## 2023-11-25 NOTE — Op Note (Signed)
 [facility] Patient Name: Heather Villegas Procedure Date: 11/25/2023 9:48 AM MRN: [id number] Endoscopist: Sandor Flatter , MD, [id number] Age: 68 Referring MD:  Date of Birth: Nov 30, 1955 Gender: Female Account #: [id number] Procedure:                Colonoscopy Indications:              Follow-up of diverticulitis in 07/2023. Medicines:                Monitored Anesthesia Care Procedure:                Pre-Anesthesia Assessment:                           - Prior to the procedure, a History and Physical                            was performed, and patient medications and                            allergies were reviewed. The patient's tolerance of                            previous anesthesia was also reviewed. The risks                            and benefits of the procedure and the sedation                            options and risks were discussed with the patient.                            All questions were answered, and informed consent                            was obtained. Prior Anticoagulants: The patient has                            taken no anticoagulant or antiplatelet agents. ASA                            Grade Assessment: II - A patient with mild systemic                            disease. After reviewing the risks and benefits,                            the patient was deemed in satisfactory condition to                            undergo the procedure.                           After obtaining informed consent, the colonoscope  was passed under direct vision. Throughout the                            procedure, the patient's blood pressure, pulse, and                            oxygen saturations were monitored continuously. The                            CF [id number] #[id number] was introduced through the anus                            and advanced to the the cecum, identified by                            appendiceal orifice and  ileocecal valve. The                            ileocecal valve, appendiceal orifice, and rectum                            were photographed. Scope In: 10:12:01 AM Scope Out: 10:33:44 AM Scope Withdrawal Time: 0 hours 13 minutes 56 seconds  Total Procedure Duration: 0 hours 21 minutes 43 seconds  Findings:                 The perianal and digital rectal examinations were                            normal.                           A 2 mm polyp was found in the cecum. The polyp was                            sessile. The polyp was removed with a cold biopsy                            forceps. Resection and retrieval were complete.                            Estimated blood loss was minimal.                           A 4 mm polyp was found in the descending colon. The                            polyp was sessile. The polyp was removed with a                            cold snare. Resection and retrieval were complete.                            Estimated blood loss was minimal.  Multiple large-mouthed and small-mouthed                            diverticula were found in the sigmoid colon and                            descending colon.                           The retroflexed view of the distal rectum and anal                            verge was normal and showed no anal or rectal                            abnormalities.                           The ascending colon revealed significantly                            excessive looping. Advancing the scope required                            using manual pressure and straightening and                            shortening the scope to obtain bowel loop reduction. Complications:            No immediate complications. Estimated Blood Loss:     Estimated blood loss was minimal. Impression:               - One 2 mm polyp in the cecum, removed with a cold                            biopsy forceps. Resected and  retrieved.                           - One 4 mm polyp in the descending colon, removed                            with a cold snare. Resected and retrieved.                           - Diverticulosis in the sigmoid colon and in the                            descending colon.                           - The distal rectum and anal verge are normal on                            retroflexion view.                           -  There was significant looping of the colon. Recommendation:           - Patient has a contact number available for                            emergencies. The signs and symptoms of potential                            delayed complications were discussed with the                            patient. Return to normal activities tomorrow.                            Written discharge instructions were provided to the                            patient.                           - Resume previous diet.                           - Continue present medications.                           - Await pathology results.                           - Repeat colonoscopy for surveillance based on                            pathology results.                           - Return to GI office PRN. Sandor Flatter, MD 11/25/2023 10:38:29 AM

## 2023-11-26 ENCOUNTER — Telehealth: Payer: Self-pay | Admitting: *Deleted

## 2023-11-26 NOTE — Telephone Encounter (Signed)
  Follow up Call-     11/25/2023    8:51 AM  Call back number  Post procedure Call Back phone  # [phone]  Permission to leave phone message Yes     Patient questions:  Message left to call us  if necessary.

## 2023-11-27 ENCOUNTER — Ambulatory Visit: Admitting: Skilled Nursing Facility1

## 2023-11-28 LAB — SURGICAL PATHOLOGY

## 2023-12-02 ENCOUNTER — Ambulatory Visit: Payer: Self-pay | Admitting: Gastroenterology

## 2024-01-07 ENCOUNTER — Encounter: Payer: Self-pay | Admitting: Obstetrics and Gynecology

## 2024-01-08 NOTE — Telephone Encounter (Signed)
 Routing FYI.   Cc: Bianca  Encounter closed.

## 2024-01-14 NOTE — Telephone Encounter (Signed)
 Renda, are you able to change this to active or does it need a new order. If you are able to do it, will you mychart the patient to let her know. Thanks

## 2024-05-26 ENCOUNTER — Encounter: Payer: Self-pay | Admitting: Urgent Care

## 2024-05-26 ENCOUNTER — Ambulatory Visit: Admitting: Urgent Care

## 2024-05-26 VITALS — BP 128/60 | HR 60 | Ht 64.0 in | Wt 202.0 lb

## 2024-05-26 DIAGNOSIS — K565 Intestinal adhesions [bands], unspecified as to partial versus complete obstruction: Secondary | ICD-10-CM

## 2024-05-26 DIAGNOSIS — I10 Essential (primary) hypertension: Secondary | ICD-10-CM

## 2024-05-26 DIAGNOSIS — I5032 Chronic diastolic (congestive) heart failure: Secondary | ICD-10-CM | POA: Diagnosis not present

## 2024-05-26 DIAGNOSIS — H8102 Meniere's disease, left ear: Secondary | ICD-10-CM

## 2024-05-26 DIAGNOSIS — R0683 Snoring: Secondary | ICD-10-CM | POA: Diagnosis not present

## 2024-05-26 DIAGNOSIS — N1330 Unspecified hydronephrosis: Secondary | ICD-10-CM

## 2024-05-26 DIAGNOSIS — Z6834 Body mass index (BMI) 34.0-34.9, adult: Secondary | ICD-10-CM

## 2024-05-26 DIAGNOSIS — Z8639 Personal history of other endocrine, nutritional and metabolic disease: Secondary | ICD-10-CM | POA: Diagnosis not present

## 2024-05-26 NOTE — Patient Instructions (Addendum)
 I have placed a referral to [facility].   I have also placed a referral to [facility] for sleep evaluation.  Please return in 4 months for annual pe with fasting labs.

## 2024-05-26 NOTE — Progress Notes (Unsigned)
 "  New Patient Office Visit  Subjective:  Patient ID: Heather Villegas, female    DOB: 01-25-1956  Age: 69 y.o. MRN: [id number]  CC:  Chief Complaint  Patient presents with   Establish Care    Abdominal issues    HPI [name] presents to establish care.  Discussed the use of AI scribe software for clinical note transcription with the patient, who gave verbal consent to proceed.  History of Present Illness   Heather Villegas is a 69 year old female who presents with abdominal symptoms and dietary concerns.  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 This required a second surgery to reimplant the ureter on top of her bladder. She has experienced significant uterine bleeding, leading to two attempted hysterectomies, which were complicated by scar tissue and adhesions, resulting in a bowel resection. She also had a small bowel obstruction a couple of years ago, treated with a nasogastric tube and hospitalization for four to five days.  Current abdominal symptoms began a few weeks ago, resembling previous bowel obstruction symptoms, including pain, nausea, and alternating constipation and diarrhea. She manages these symptoms by adjusting her diet, primarily consuming chicken broth during flare-ups. She follows a strict diet to prevent symptoms, avoiding salt, caffeine , seeds, nuts, and raw vegetables, and consuming low-fiber, liquid diets initially. She states her sx from a few weeks ago have dissipated and she s not longer having the abdominal pain. Despite being active, walking over ten thousand steps daily, and working as a it sales professional, she has difficulty losing weight. Her medication regimen includes amlodipine  and atorvastatin , with other supplements taken over the counter.  She has a history of high blood pressure, diagnosed after an episode of feeling unwell at work,  where her blood pressure was recorded at 198/unknown. She was started on amlodipine  in March of the current year.  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 She also reports a history of vertigo and follows with a neurologist.  She mentions a past episode of vaginal bleeding, which was evaluated with a biopsy that returned normal results. She was referred for a sleep study due to suspected sleep apnea, but the referral process was not completed.       Outpatient Encounter Medications as of 05/26/2024  Medication Sig   amLODipine  (NORVASC ) 10 MG tablet Take 1 tablet (10 mg total) by mouth daily.   Ascorbic Acid (VITAMIN C PO) Take 2 capsules by mouth daily. Take 2 capsules daily   atorvastatin  (LIPITOR) 40 MG tablet Take 1 tablet (40 mg total) by mouth daily.   b complex vitamins tablet Take 1 tablet by mouth daily.   Coenzyme Q10 (CO Q 10 PO) Take 1 capsule by mouth daily.   Multiple Vitamin (MULTIVITAMIN) capsule Take 1 capsule by mouth daily.   Omega-3 Fatty Acids (FISH OIL) 1000 MG CAPS Take 1 capsule by mouth daily.   Turmeric (QC TUMERIC COMPLEX PO) Take by mouth.   VITAMIN D  PO Take 2 tablets by mouth daily.   No facility-administered encounter medications on file as of 05/26/2024.    Past Medical History:  Diagnosis Date   Bowel obstruction (HCC)    Cataract    Chronic kidney disease    Deafness in right ear    Diverticulitis  Heart murmur    High blood pressure    Hyperlipidemia    diet  controlled   Hypertension    Meniere disease    Obesity    Post-operative nausea and vomiting    Vertigo    To ED Friday night 9-10- was given Meclazine - improved     Past Surgical History:  Procedure Laterality Date   ABLATION     APPENDECTOMY     bowel reconstruction  2003   had adhesions/ removed part of small intestine   BREAST BIOPSY Left 2013   left/ precancerous  lesion   CATARACT EXTRACTION     left eye   EYE SURGERY     KIDNEY STONE SURGERY  2018   lithotripsy   SMALL INTESTINE SURGERY     TONSILLECTOMY     TUBAL LIGATION     ureter reconnection     URETERAL REIMPLANTION      Family History  Problem Relation Age of Onset   Lung cancer Mother    Heart defect Mother    Lung cancer Father    Thrombosis Father    Drug abuse Brother    Colon cancer Neg Hx    Esophageal cancer Neg Hx    Rectal cancer Neg Hx    Stomach cancer Neg Hx     Social History   Socioeconomic History   Marital status: Married    Spouse name: Not on file   Number of children: 3   Years of education: 12   Highest education level: 12th grade  Occupational History   Occupation: conservation officer, historic buildings  Tobacco Use   Smoking status: Former    Current packs/day: 0.00    Types: Cigarettes    Quit date: 1987    Years since quitting: 39.0   Smokeless tobacco: Never   Tobacco comments:    Only smoked for a year and a half  Vaping Use   Vaping status: Never Used  Substance and Sexual Activity   Alcohol use: Yes    Comment: occ   Drug use: Never   Sexual activity: Not Currently    Partners: Male    Birth control/protection: Post-menopausal, Surgical    Comment: btl,1st intercourse- 20, partners- 4  Other Topics Concern   Not on file  Social History Narrative   Lives at home with her husband.   Right-handed.   1-2 cups caffeine  per day.   Social Drivers of Health   Tobacco Use: Medium Risk (05/26/2024)   Patient History    Smoking Tobacco Use: Former    Smokeless Tobacco Use: Never    Passive Exposure: Not on file  Financial Resource Strain: Low Risk (05/22/2024)   Overall Financial Resource Strain (CARDIA)    Difficulty of Paying Living Expenses: Not hard at all  Food Insecurity: No Food Insecurity (05/22/2024)   Epic    Worried About Programme Researcher, Broadcasting/film/video in the Last Year: Never true    Ran Out of Food in the Last Year: Never true  Transportation  Needs: No Transportation Needs (05/22/2024)   Epic    Lack of Transportation (Medical): No    Lack of Transportation (Non-Medical): No  Physical Activity: Sufficiently Active (05/22/2024)   Exercise Vital Sign    Days of Exercise per Week: 5 days    Minutes of Exercise per Session: 30 min  Stress: Stress Concern Present (05/22/2024)   [employer] of Occupational Health - Occupational Stress Questionnaire    Feeling of Stress: To  some extent  Social Connections: Moderately Isolated (05/22/2024)   Social Connection and Isolation Panel    Frequency of Communication with Friends and Family: Once a week    Frequency of Social Gatherings with Friends and Family: Once a week    Attends Religious Services: 1 to 4 times per year    Active Member of [employer] or Organizations: No    Attends Engineer, Structural: Not on file    Marital Status: Married  Intimate Partner Violence: Unknown (02/04/2023)   Received from [facility]   HITS    Physically Hurt: Not on file    Insult or Talk Down To: Not on file    Threaten Physical Harm: Not on file    Scream or Curse: Not on file  Depression (PHQ2-9): Low Risk (05/26/2024)   Depression (PHQ2-9)    PHQ-2 Score: 0  Alcohol Screen: Low Risk (05/22/2024)   Alcohol Screen    Last Alcohol Screening Score (AUDIT): 1  Housing: Unknown (05/22/2024)   Epic    Unable to Pay for Housing in the Last Year: No    Number of Times Moved in the Last Year: Not on file    Homeless in the Last Year: No  Utilities: Not on file  Health Literacy: Not on file    ROS: as noted in HPI  Objective:  BP 128/60   Pulse 60   Ht 5' 4 (1.626 m)   Wt 202 lb (91.6 kg)   SpO2 96%   BMI 34.67 kg/m   Physical Exam Vitals and nursing note reviewed. Exam conducted with a chaperone present.  Constitutional:      General: She is not in acute distress.    Appearance: Normal appearance. She is not ill-appearing, toxic-appearing or diaphoretic.  HENT:     Head:  Normocephalic and atraumatic.     Right Ear: Tympanic membrane, ear canal and external ear normal. There is no impacted cerumen.     Left Ear: Tympanic membrane, ear canal and external ear normal. There is no impacted cerumen.     Nose: Nose normal.     Mouth/Throat:     Mouth: Mucous membranes are moist.     Pharynx: No oropharyngeal exudate or posterior oropharyngeal erythema.  Eyes:     General: No scleral icterus.       Right eye: No discharge.        Left eye: No discharge.     Extraocular Movements: Extraocular movements intact.     Pupils: Pupils are equal, round, and reactive to light.  Cardiovascular:     Rate and Rhythm: Normal rate and regular rhythm.  Pulmonary:     Effort: Pulmonary effort is normal. No respiratory distress.     Breath sounds: Normal breath sounds. No stridor. No wheezing or rhonchi.  Abdominal:     General: Abdomen is flat. There is no distension.     Palpations: Abdomen is soft.     Tenderness: There is no abdominal tenderness. There is no guarding or rebound.  Musculoskeletal:     Cervical back: Normal range of motion and neck supple. No rigidity or tenderness.  Lymphadenopathy:     Cervical: No cervical adenopathy.  Skin:    General: Skin is warm and dry.     Coloration: Skin is not jaundiced.     Findings: No bruising, erythema or rash.  Neurological:     General: No focal deficit present.     Mental Status: She is alert and  oriented to person, place, and time.     Gait: Gait normal.  Psychiatric:        Mood and Affect: Mood normal.        Behavior: Behavior normal.     Last CBC Lab Results  Component Value Date   WBC 4.5 09/27/2023   HGB 14.2 09/27/2023   HCT 42.4 09/27/2023   MCV 90.6 09/27/2023   MCH 29.9 08/05/2023   RDW 14.4 09/27/2023   PLT 270.0 09/27/2023   Last metabolic panel Lab Results  Component Value Date   GLUCOSE 90 09/27/2023   NA 140 09/27/2023   K 4.6 09/27/2023   CL 103 09/27/2023   CO2 31 09/27/2023    BUN 17 09/27/2023   CREATININE 0.71 09/27/2023   GFR 88.85 09/27/2023   CALCIUM  9.9 09/27/2023   PHOS 3.4 07/12/2021   PROT 7.6 09/27/2023   ALBUMIN 4.6 09/27/2023   BILITOT 0.7 09/27/2023   ALKPHOS 81 09/27/2023   AST 23 09/27/2023   ALT 23 09/27/2023   ANIONGAP 10 08/05/2023   Last lipids Lab Results  Component Value Date   CHOL 172 10/11/2023   HDL 63 10/11/2023   LDLCALC 95 10/11/2023   LDLDIRECT 145.0 02/10/2019   TRIG 77 10/11/2023   CHOLHDL 2.7 10/11/2023   Last hemoglobin A1c Lab Results  Component Value Date   HGBA1C 5.4 01/31/2023   Last thyroid  functions Lab Results  Component Value Date   TSH 3.114 07/12/2021   Last vitamin D  Lab Results  Component Value Date   VD25OH 56.85 01/31/2023   Last vitamin B12 and Folate No results found for: VITAMINB12, FOLATE    Assessment & Plan:  Meniere's disease of left ear  Intermittent small bowel obstruction due to adhesions (HCC)  BMI 34.0-34.9,adult  Hydronephrosis, unspecified hydronephrosis type  Snoring -     Ambulatory referral to Neurology  Essential hypertension  Chronic diastolic heart failure (HCC)  History of diabetes mellitus  Assessment and Plan    Intermittent small bowel obstruction due to adhesions Resolved recent symptoms. Adhesions from previous surgeries contribute to condition. Cautious diet to prevent recurrence. - Referred to [facility] for virtual dietary counseling to manage diet and prevent recurrence of symptoms.  Chronic right hydronephrosis Secondary to previous ureteral obstruction and reimplantation. Right kidney function reduced to 40%, overall function normal. No acute issues.  Meniere's disease, left ear Contributes to vertigo and hearing loss. Managed with hearing aids.  Essential hypertension Managed with amlodipine . Blood pressure well-controlled at 128/60. - Continue amlodipine  for blood pressure management.  Obesity, BMI 34.0-34.9 Active lifestyle with  over 10,000 steps daily. Struggles with weight loss due to dietary restrictions from previous surgeries. - Referred to [facility] for virtual dietary counseling to support weight loss efforts.  Snoring Suspected sleep apnea due to snoring, nasal congestion, and new onset hypertension. Previous sleep study referral not completed. - Placed new referral for sleep study with Dr. Chalice at [facility].  General health maintenance Due for Shingrix vaccine but declined. No other vaccines due. - Offered Shingrix vaccine if interested in the future.        Return in about 4 months (around 09/28/2024) for Annual Physical.   Benton LITTIE Gave, PA  "

## 2024-05-27 DIAGNOSIS — I5032 Chronic diastolic (congestive) heart failure: Secondary | ICD-10-CM | POA: Insufficient documentation

## 2024-06-03 ENCOUNTER — Encounter: Payer: Self-pay | Admitting: Urgent Care

## 2024-07-08 ENCOUNTER — Institutional Professional Consult (permissible substitution): Admitting: Neurology

## 2024-08-04 ENCOUNTER — Ambulatory Visit: Admitting: Cardiology

## 2024-08-14 ENCOUNTER — Ambulatory Visit: Admitting: Cardiology

## 2024-09-28 ENCOUNTER — Encounter: Admitting: Urgent Care
